# Patient Record
Sex: Female | Born: 1940 | Race: White | Hispanic: No | Marital: Married | Smoking: Former smoker
Health system: Southern US, Community
[De-identification: ages and names within clinical notes are randomized; demographics above are authoritative.]

## PROBLEM LIST (undated history)

## (undated) DIAGNOSIS — I10 Essential (primary) hypertension: Secondary | ICD-10-CM

## (undated) DIAGNOSIS — C259 Malignant neoplasm of pancreas, unspecified: Secondary | ICD-10-CM

## (undated) DIAGNOSIS — Z801 Family history of malignant neoplasm of trachea, bronchus and lung: Secondary | ICD-10-CM

## (undated) DIAGNOSIS — Z8051 Family history of malignant neoplasm of kidney: Secondary | ICD-10-CM

## (undated) DIAGNOSIS — E119 Type 2 diabetes mellitus without complications: Secondary | ICD-10-CM

## (undated) DIAGNOSIS — Z800 Family history of malignant neoplasm of digestive organs: Secondary | ICD-10-CM

## (undated) DIAGNOSIS — N3090 Cystitis, unspecified without hematuria: Secondary | ICD-10-CM

## (undated) HISTORY — DX: Family history of malignant neoplasm of trachea, bronchus and lung: Z80.1

## (undated) HISTORY — DX: Type 2 diabetes mellitus without complications: E11.9

## (undated) HISTORY — DX: Family history of malignant neoplasm of kidney: Z80.51

## (undated) HISTORY — PX: BACK SURGERY: SHX140

## (undated) HISTORY — DX: Family history of malignant neoplasm of digestive organs: Z80.0

## (undated) HISTORY — DX: Essential (primary) hypertension: I10

## (undated) HISTORY — DX: Malignant neoplasm of pancreas, unspecified: C25.9

## (undated) HISTORY — DX: Cystitis, unspecified without hematuria: N30.90

## (undated) HISTORY — PX: TUBAL LIGATION: SHX77

---

## 1998-10-22 ENCOUNTER — Ambulatory Visit (HOSPITAL_COMMUNITY): Admission: RE | Admit: 1998-10-22 | Discharge: 1998-10-22 | Payer: Self-pay | Admitting: *Deleted

## 2000-11-03 ENCOUNTER — Encounter: Payer: Self-pay | Admitting: Specialist

## 2000-11-03 ENCOUNTER — Observation Stay (HOSPITAL_COMMUNITY): Admission: RE | Admit: 2000-11-03 | Discharge: 2000-11-04 | Payer: Self-pay | Admitting: Specialist

## 2001-11-17 ENCOUNTER — Encounter: Payer: Self-pay | Admitting: Specialist

## 2001-11-17 ENCOUNTER — Encounter: Admission: RE | Admit: 2001-11-17 | Discharge: 2001-11-17 | Payer: Self-pay | Admitting: Specialist

## 2002-08-17 ENCOUNTER — Encounter: Admission: RE | Admit: 2002-08-17 | Discharge: 2002-08-17 | Payer: Self-pay | Admitting: Specialist

## 2002-08-17 ENCOUNTER — Encounter: Payer: Self-pay | Admitting: Specialist

## 2005-03-17 ENCOUNTER — Ambulatory Visit: Payer: Self-pay | Admitting: Unknown Physician Specialty

## 2006-03-24 ENCOUNTER — Ambulatory Visit: Payer: Self-pay | Admitting: Unknown Physician Specialty

## 2007-03-30 ENCOUNTER — Ambulatory Visit: Payer: Self-pay | Admitting: Unknown Physician Specialty

## 2007-04-01 ENCOUNTER — Encounter: Admission: RE | Admit: 2007-04-01 | Discharge: 2007-04-01 | Payer: Self-pay | Admitting: Specialist

## 2007-04-01 IMAGING — CR DG MYELOGRAM LUMBAR
6 series · 6 of 6 positions shown · IV contrast (omnipaque)
Comparison: none

CLINICAL DATA: The patient has low back with bilateral buttock and thigh pain.  She is referred for a lumbar myelogram. 
 LUMBAR MYELOGRAM:
 Lumbar puncture was performed at the L3-4 interlaminar level.  CSF is clear.  15cc of Omnipaque 180 contrast was injection and the needle was withdrawn.  Multiple myelogram images were obtained.  Exam supplemented with standing flexion/extension views as well as lateral bending views.  The patient has central stenosis at L4-5 with a left extradural defect.  Mild stenosis L5-S1.  In the lateral projection, ventral defects are seen at L3-4, L4-5, and L5-S1.  Disc height loss at L5-S1.  In the neutral lateral projection, there is no listhesis.  No significant change through flexion and extension.  Lateral bending views show stenosis at L4-5.  Cord filling of the left L4 and L5 roots with bending of the left, poor filling of the right L4 root, with bending of the right.
TECHNIQUE: Multidetector CT imaging of the lumbar spine was performed after intrathecal injection of contrast.  Multiplanar CT image reconstructions were also generated.

[view not recorded (1 of 6)]
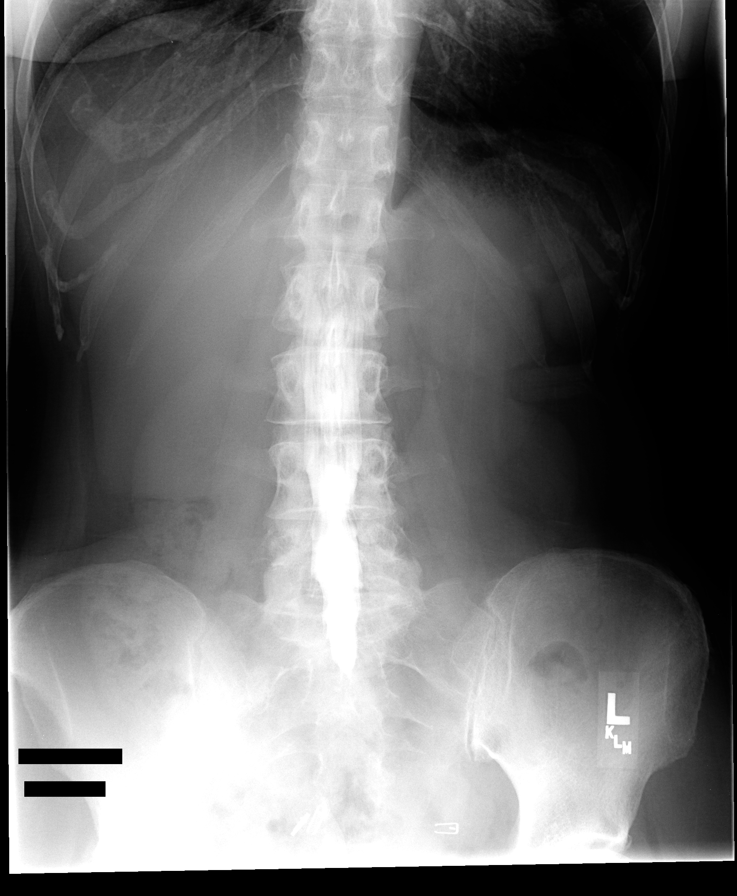

[view not recorded (2 of 6)]
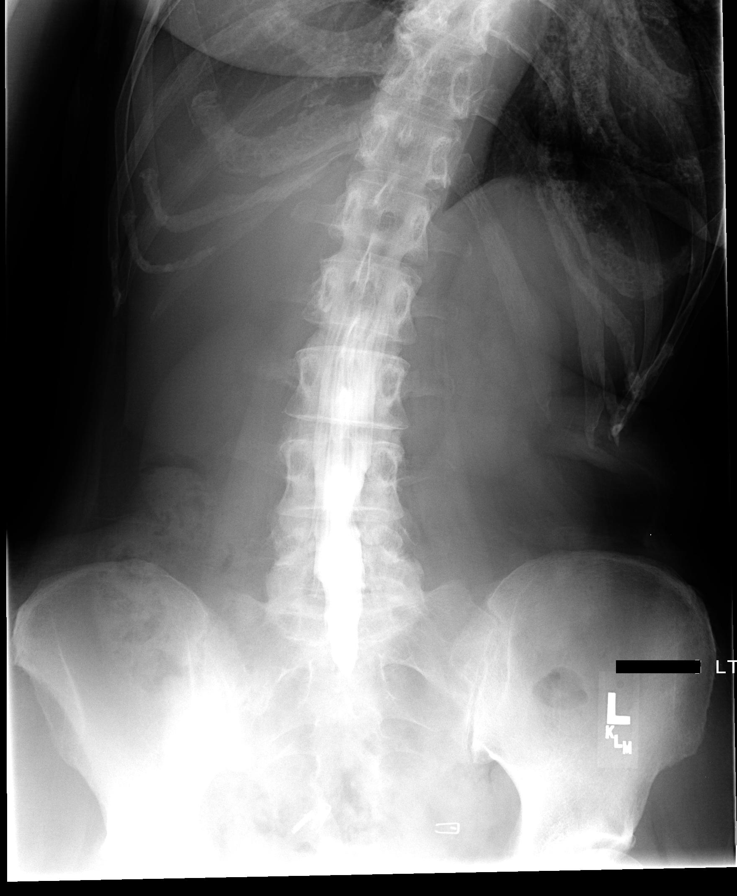

[view not recorded (3 of 6)]
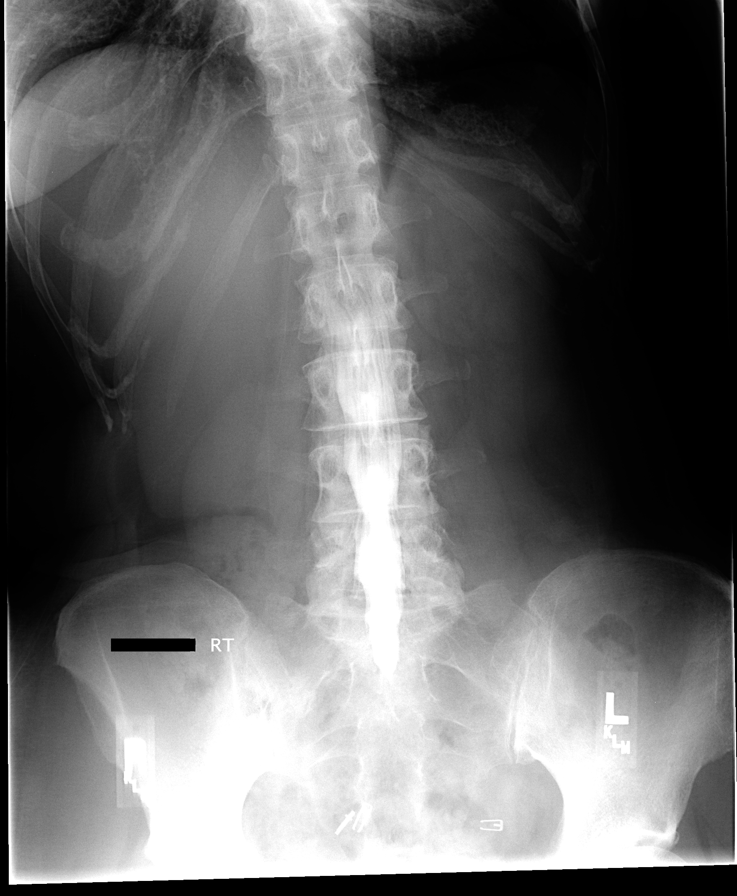

[view not recorded (4 of 6)]
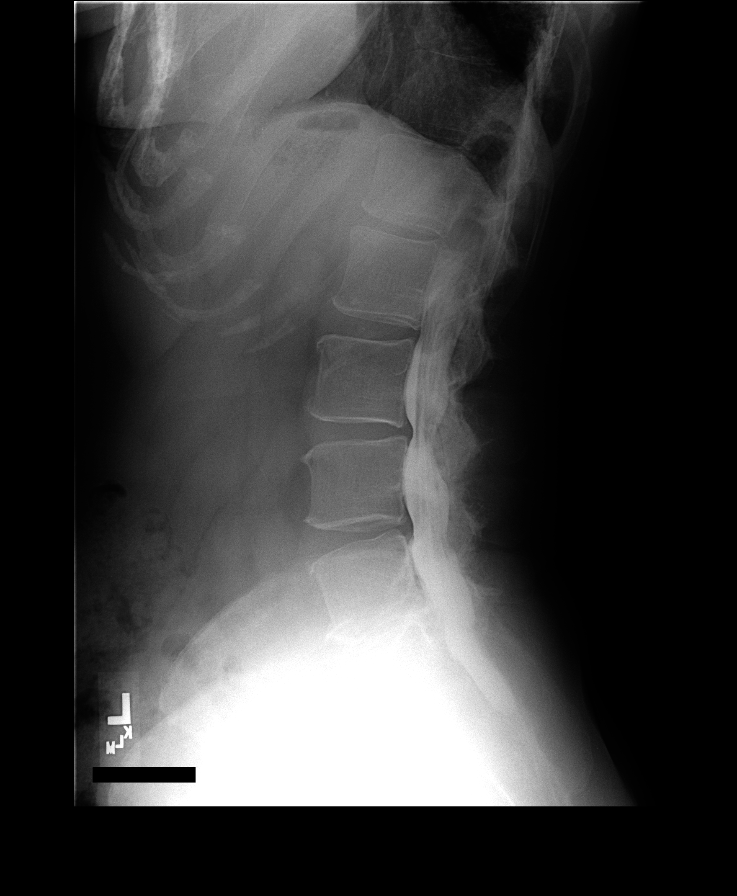

[view not recorded (5 of 6)]
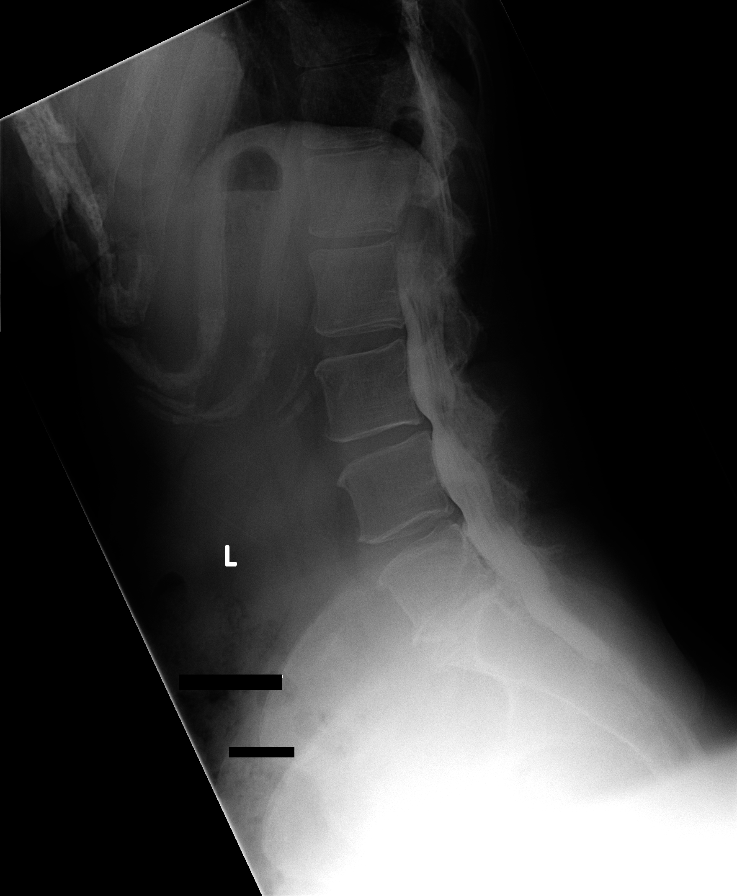

[view not recorded (6 of 6)]
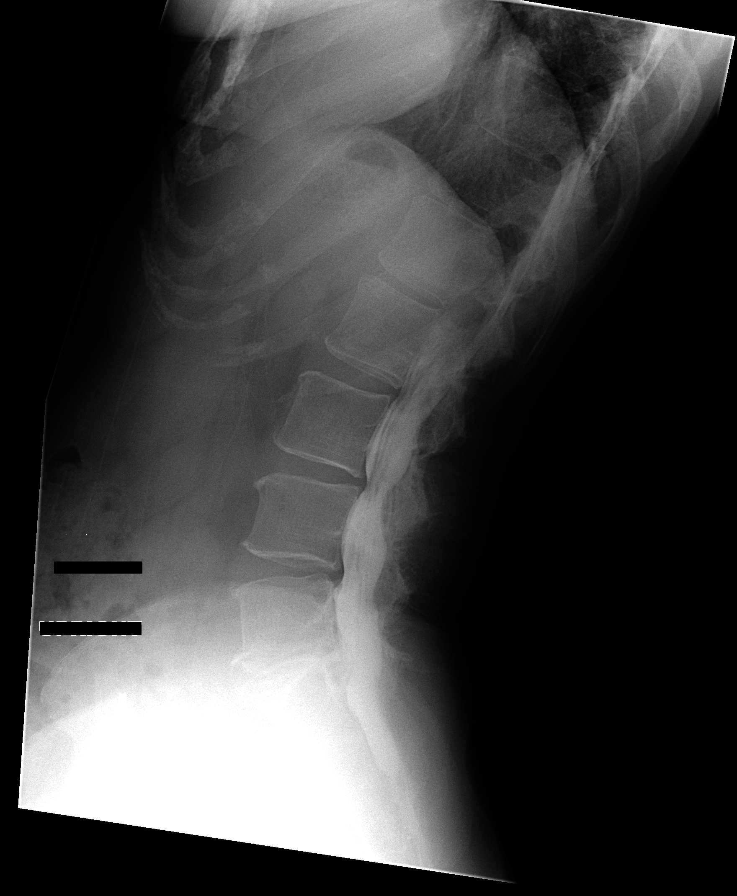

[6 of 6 positions shown; findings below may reference images not displayed]

IMPRESSION: Stenosis at L4-5.  There is poor filling of the left L4 and L5 roots with lateral bending to the left and poor filling of the right L4 root with lateral bending to the right.  No listhesis.  CT to follow. 
 CT LUMBAR SPINE W/CONTRAST (POST-MYELOGRAM):
FINDINGS: In the sagittal neutral position, there is minimal anterolisthesis of L3 on L4 with posterior central disc protrusion.  L4-5 mild disc height loss with disc protrusion.  At L5-S1, there is marked disc height loss with reactive endplate changes.  Subtle vacuum phenomenon is appreciated.  There is extensive posterior osseous spurring.  The parasagittal sequences are negative for encroachment on the exiting nerve roots. 
 Axial Imaging: 
 L1-2:  Normal. 
 L2-3:  Normal. 
 L3-4:  Broad disc protrusion with facet degenerative change right greater than left.   The L3 roots exit without encroachment.  There is moderate right subarticular lateral recess stenosis with mild to moderate multifactorial central stenosis. 
 L4-5:  The patient has a laminectomy defect right.  Diffuse disc bulge eccentric left with caudal extension.  This in conjunction with facet hypertrophy narrow the left subarticular lateral recess with mild to moderate subarticular lateral recess stenosis.  Narrowing of the left L4 foramen without encroachment on the L4 root.  No significant central stenosis. 
 L5-S1:  Disc osteophyte complex in conjunction with disc height loss and facet hypertrophy result in severe left subarticular lateral recess stenosis.  There is central disc protrusion on the thecal sac without stenosis.  No mass effect upon the right S1 root.  The left S1 root is displaced posteriorly.  The right L5 root exits without encroachment.
IMPRESSION: 1.   L3-4:  Moderate multifactorial central stenosis with moderate subarticular recesses stenosis left greater than right. 
 2.  L4-5:  Left subarticular lateral recess stenosis with narrowing in the inferior left L4 foramen.  The central stenosis appreciated on the myelogram is less apparent on the CT. 
 3.  L5-S1:  Severe left subarticular lateral recess stenosis is multifactorial, especially with disc height loss, disc protrusion, and facet hypertrophy.  Additionally, there is moderate to severe left foraminal narrowing.  No definite encroachment on the nerve root is seen on the parasagittal sequences.  There is no right foraminal stenosis nor mass effect on the right S1 root.

## 2007-04-01 IMAGING — CT CT L SPINE W/ CM
3 of 9 series · 10 of 27 positions shown, 11 images · IV contrast (omnipaque)
Comparison: none

CLINICAL DATA: The patient has low back with bilateral buttock and thigh pain.  She is referred for a lumbar myelogram. 
 LUMBAR MYELOGRAM:
 Lumbar puncture was performed at the L3-4 interlaminar level.  CSF is clear.  15cc of Omnipaque 180 contrast was injection and the needle was withdrawn.  Multiple myelogram images were obtained.  Exam supplemented with standing flexion/extension views as well as lateral bending views.  The patient has central stenosis at L4-5 with a left extradural defect.  Mild stenosis L5-S1.  In the lateral projection, ventral defects are seen at L3-4, L4-5, and L5-S1.  Disc height loss at L5-S1.  In the neutral lateral projection, there is no listhesis.  No significant change through flexion and extension.  Lateral bending views show stenosis at L4-5.  Cord filling of the left L4 and L5 roots with bending of the left, poor filling of the right L4 root, with bending of the right.
TECHNIQUE: Multidetector CT imaging of the lumbar spine was performed after intrathecal injection of contrast.  Multiplanar CT image reconstructions were also generated.

[Series 2: l spine · axial · 0.27mm/px · z∈[-214,-132]mm · 2 of 101 slices shown]
[im 34/101  bone]
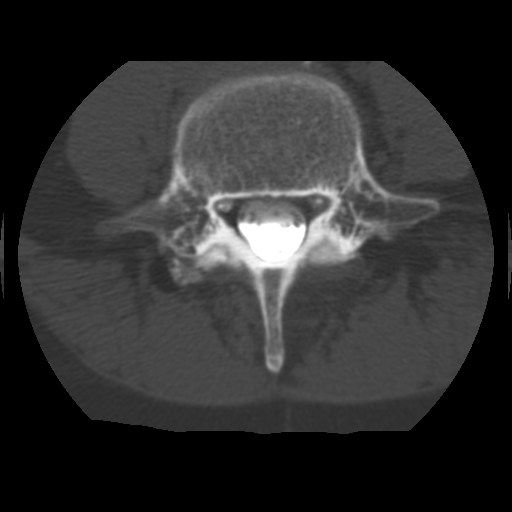
[im 67/101  bone]
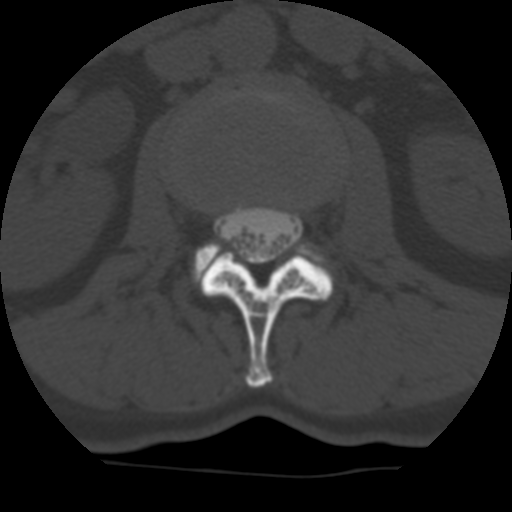

[Series 3: bone windows · axial · 0.27mm/px · z∈[-234,-109]mm · 3 of 101 slices shown, 4 images]
[im 26/101  soft-tissue]
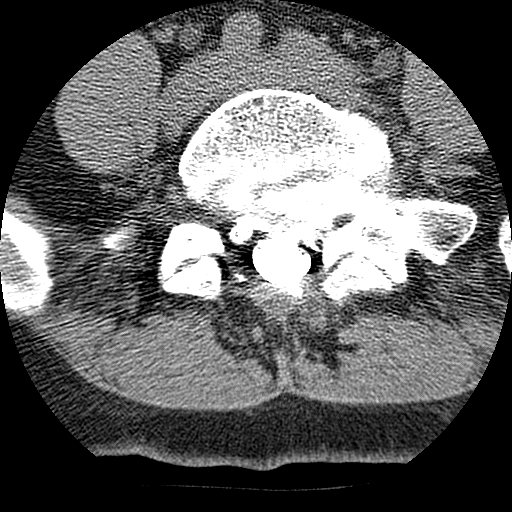
[im 26/101  bone]
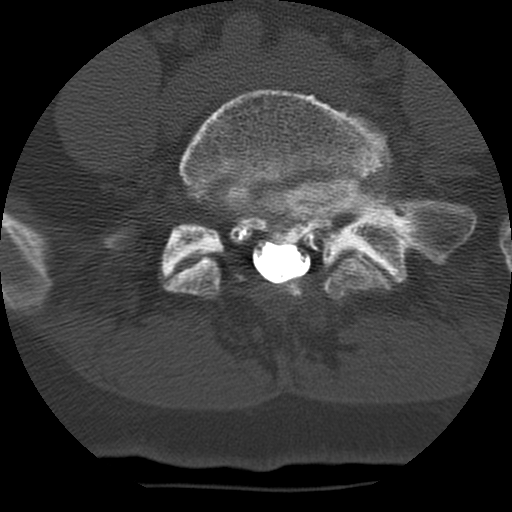
[im 51/101  bone]
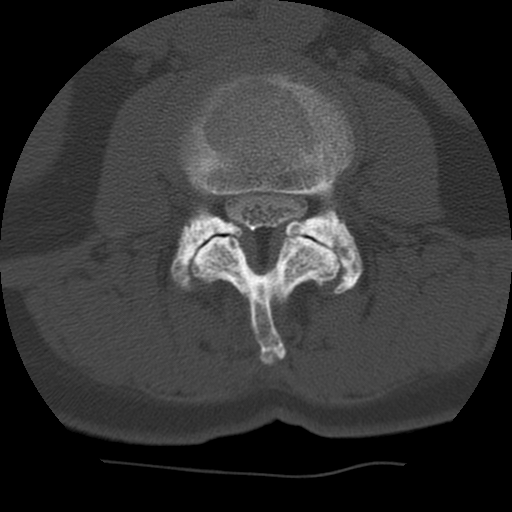
[im 76/101  bone]
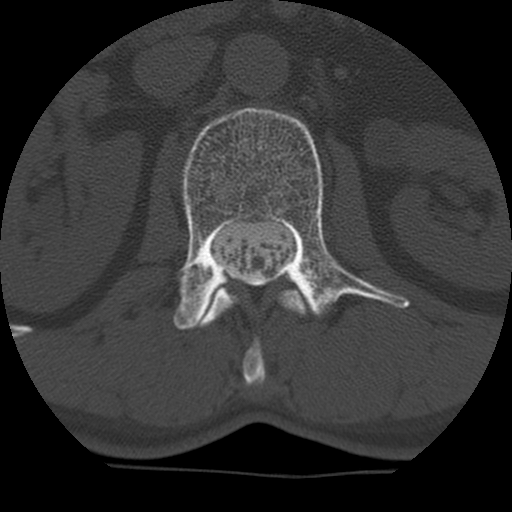

[Series 400: coronal · coronal · 0.50mm/px · 5 of 40 slices shown]
[im 7/40  bone]
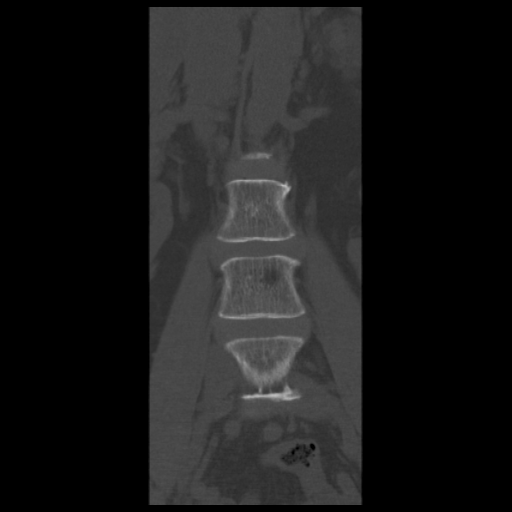
[im 14/40  bone]
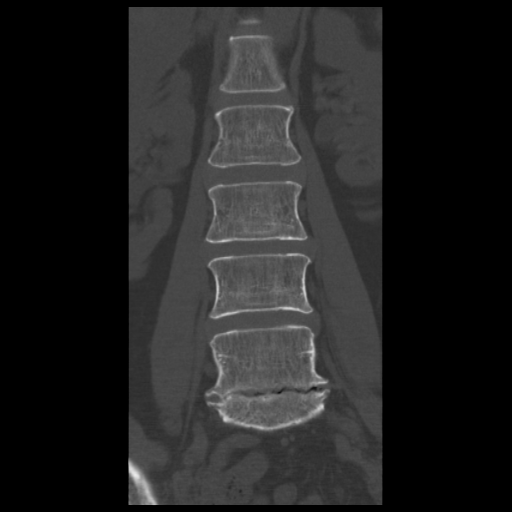
[im 20/40  bone]
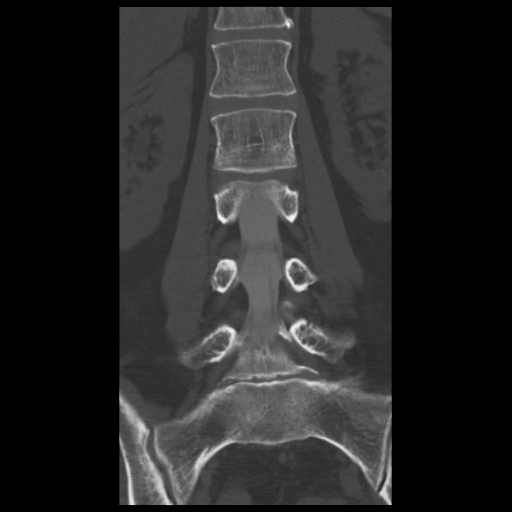
[im 27/40  bone]
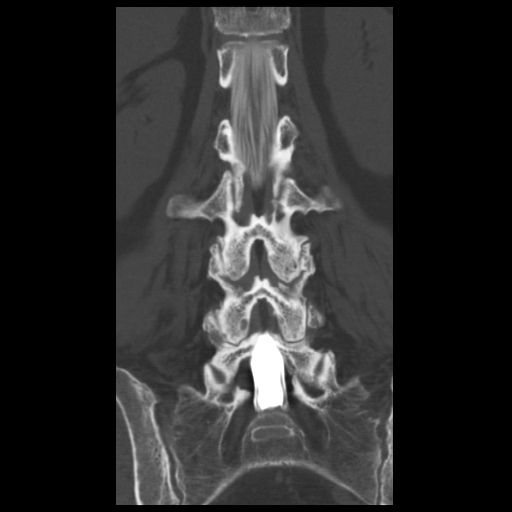
[im 33/40  bone]
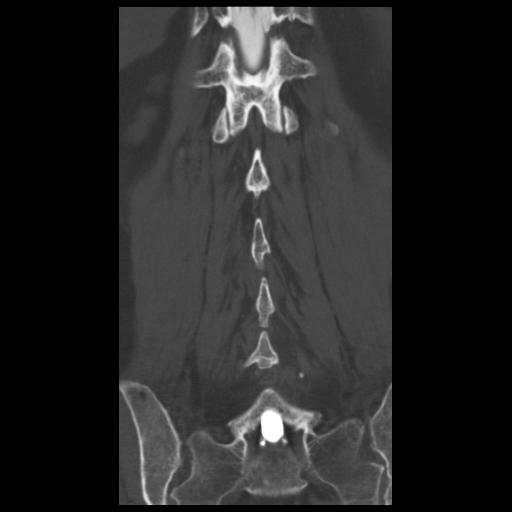

[10 of 27 positions shown; findings below may reference images not displayed]

IMPRESSION: Stenosis at L4-5.  There is poor filling of the left L4 and L5 roots with lateral bending to the left and poor filling of the right L4 root with lateral bending to the right.  No listhesis.  CT to follow. 
 CT LUMBAR SPINE W/CONTRAST (POST-MYELOGRAM):
FINDINGS: In the sagittal neutral position, there is minimal anterolisthesis of L3 on L4 with posterior central disc protrusion.  L4-5 mild disc height loss with disc protrusion.  At L5-S1, there is marked disc height loss with reactive endplate changes.  Subtle vacuum phenomenon is appreciated.  There is extensive posterior osseous spurring.  The parasagittal sequences are negative for encroachment on the exiting nerve roots. 
 Axial Imaging: 
 L1-2:  Normal. 
 L2-3:  Normal. 
 L3-4:  Broad disc protrusion with facet degenerative change right greater than left.   The L3 roots exit without encroachment.  There is moderate right subarticular lateral recess stenosis with mild to moderate multifactorial central stenosis. 
 L4-5:  The patient has a laminectomy defect right.  Diffuse disc bulge eccentric left with caudal extension.  This in conjunction with facet hypertrophy narrow the left subarticular lateral recess with mild to moderate subarticular lateral recess stenosis.  Narrowing of the left L4 foramen without encroachment on the L4 root.  No significant central stenosis. 
 L5-S1:  Disc osteophyte complex in conjunction with disc height loss and facet hypertrophy result in severe left subarticular lateral recess stenosis.  There is central disc protrusion on the thecal sac without stenosis.  No mass effect upon the right S1 root.  The left S1 root is displaced posteriorly.  The right L5 root exits without encroachment.
IMPRESSION: 1.   L3-4:  Moderate multifactorial central stenosis with moderate subarticular recesses stenosis left greater than right. 
 2.  L4-5:  Left subarticular lateral recess stenosis with narrowing in the inferior left L4 foramen.  The central stenosis appreciated on the myelogram is less apparent on the CT. 
 3.  L5-S1:  Severe left subarticular lateral recess stenosis is multifactorial, especially with disc height loss, disc protrusion, and facet hypertrophy.  Additionally, there is moderate to severe left foraminal narrowing.  No definite encroachment on the nerve root is seen on the parasagittal sequences.  There is no right foraminal stenosis nor mass effect on the right S1 root.

## 2007-04-01 IMAGING — RF DG MYELOGRAM LUMBAR
13 of 18 series · 13 of 18 positions shown · IV contrast (omnipaque)
Comparison: none

CLINICAL DATA: The patient has low back with bilateral buttock and thigh pain.  She is referred for a lumbar myelogram. 
 LUMBAR MYELOGRAM:
 Lumbar puncture was performed at the L3-4 interlaminar level.  CSF is clear.  15cc of Omnipaque 180 contrast was injection and the needle was withdrawn.  Multiple myelogram images were obtained.  Exam supplemented with standing flexion/extension views as well as lateral bending views.  The patient has central stenosis at L4-5 with a left extradural defect.  Mild stenosis L5-S1.  In the lateral projection, ventral defects are seen at L3-4, L4-5, and L5-S1.  Disc height loss at L5-S1.  In the neutral lateral projection, there is no listhesis.  No significant change through flexion and extension.  Lateral bending views show stenosis at L4-5.  Cord filling of the left L4 and L5 roots with bending of the left, poor filling of the right L4 root, with bending of the right.
TECHNIQUE: Multidetector CT imaging of the lumbar spine was performed after intrathecal injection of contrast.  Multiplanar CT image reconstructions were also generated.

[Series 1: myelogram  white · 1 of 1 slices shown (1 of 13)]
[im 1/1]
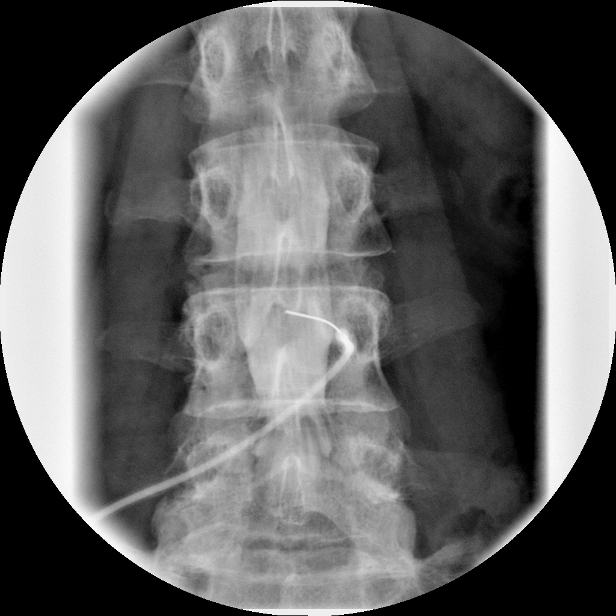

[Series 3: myelogram  white · 1 of 1 slices shown (2 of 13)]
[im 1/1]
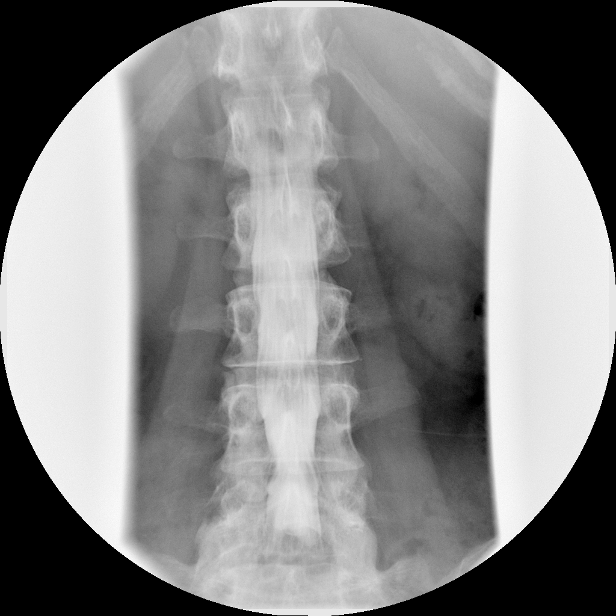

[Series 4: myelogram  white · 1 of 1 slices shown (3 of 13)]
[im 1/1]
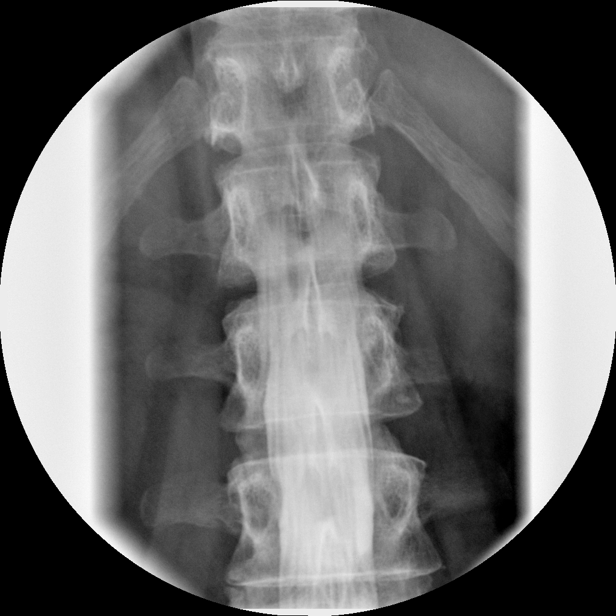

[Series 5: myelogram  white · 1 of 1 slices shown (4 of 13)]
[im 1/1]
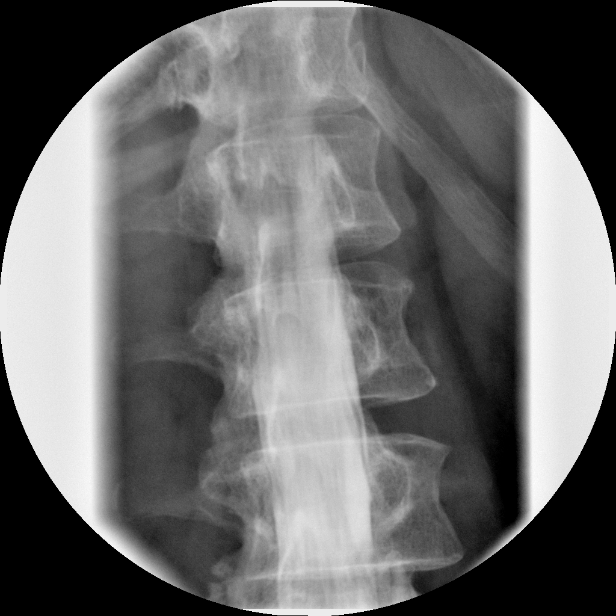

[Series 7: myelogram  white · 1 of 1 slices shown (5 of 13)]
[im 1/1]
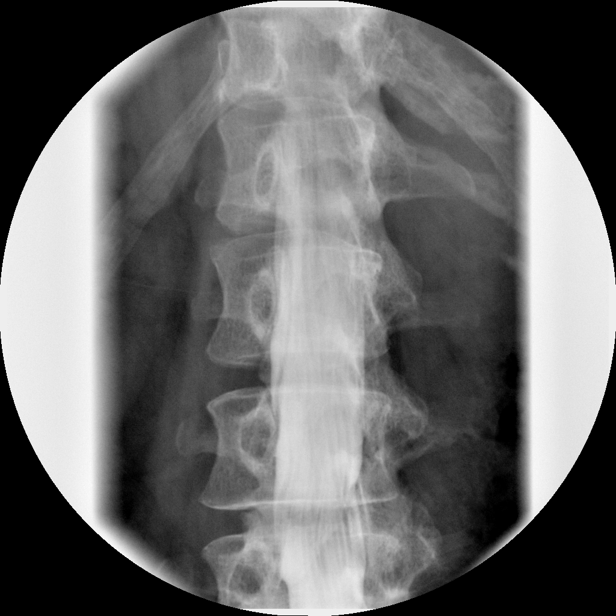

[Series 8: myelogram  white · 1 of 1 slices shown (6 of 13)]
[im 1/1]
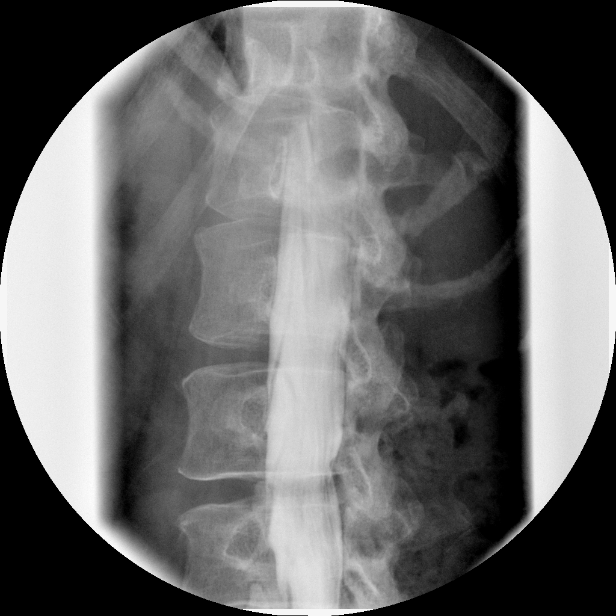

[Series 10: myelogram  white · 1 of 1 slices shown (7 of 13)]
[im 1/1]
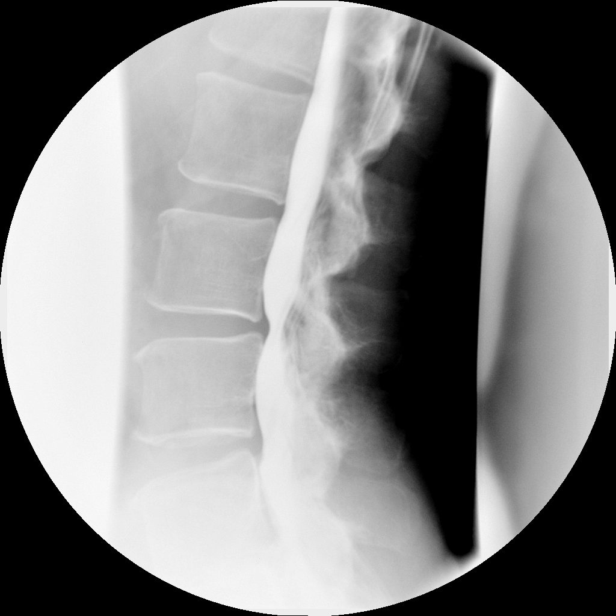

[Series 11: myelogram  white · 1 of 1 slices shown (8 of 13)]
[im 1/1]
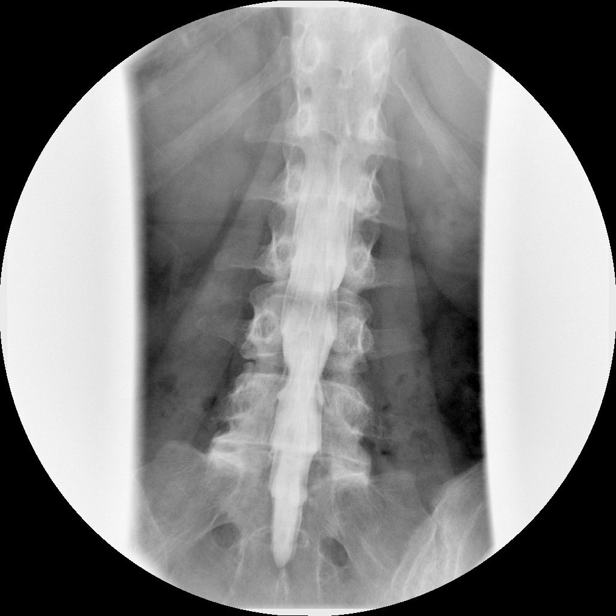

[Series 12: myelogram  white · 1 of 1 slices shown (9 of 13)]
[im 1/1]
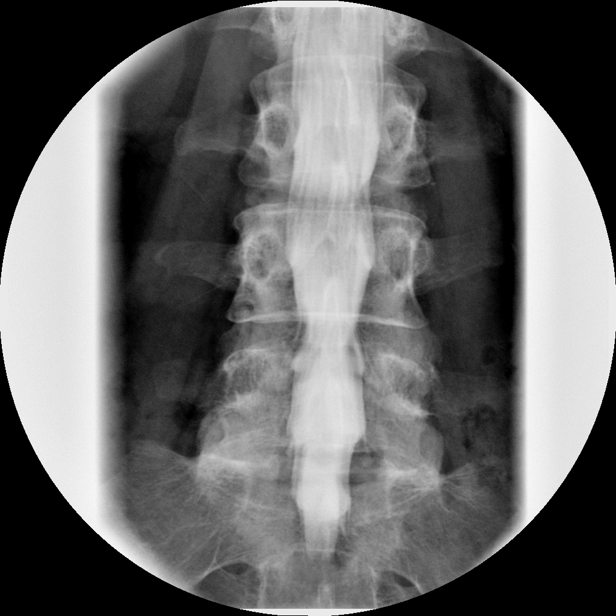

[Series 14: myelogram  white · 1 of 1 slices shown (10 of 13)]
[im 1/1]
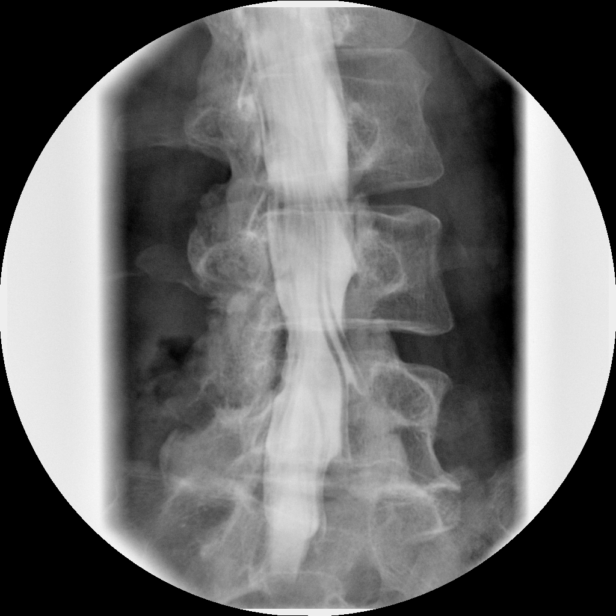

[Series 15: myelogram  white · 1 of 1 slices shown (11 of 13)]
[im 1/1]
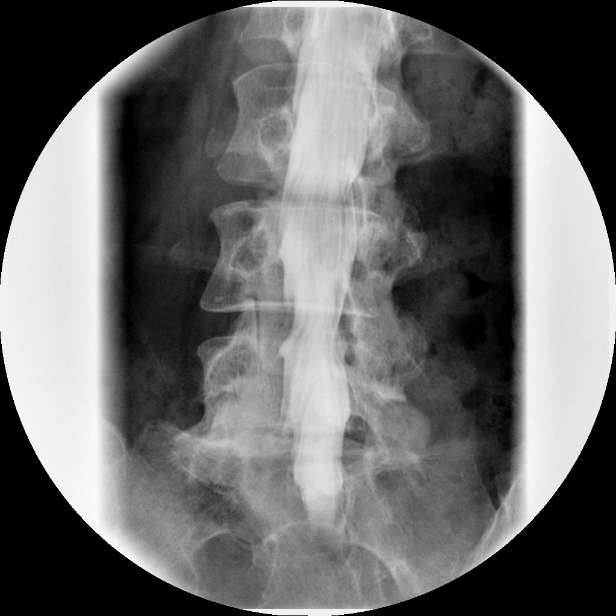

[Series 16: myelogram  white · 1 of 1 slices shown (12 of 13)]
[im 1/1]
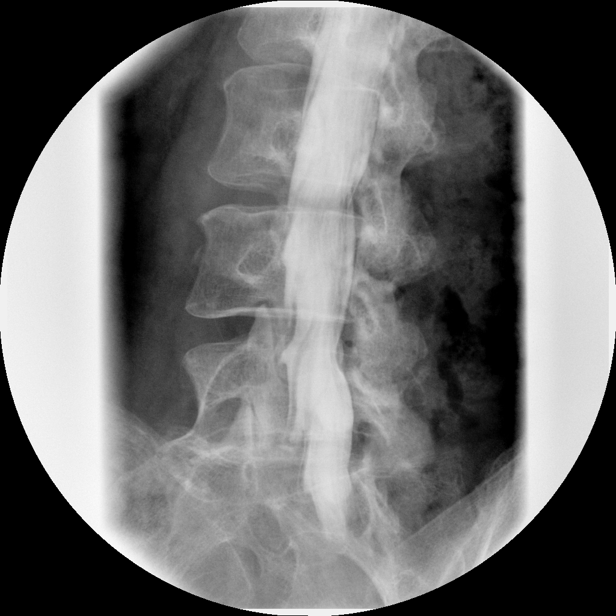

[Series 18: myelogram  white · 1 of 1 slices shown (13 of 13)]
[im 1/1]
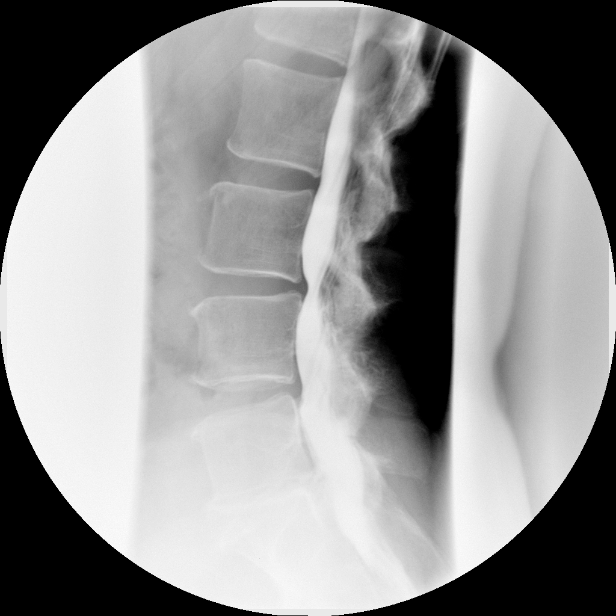

[13 of 18 positions shown; findings below may reference images not displayed]

IMPRESSION: Stenosis at L4-5.  There is poor filling of the left L4 and L5 roots with lateral bending to the left and poor filling of the right L4 root with lateral bending to the right.  No listhesis.  CT to follow. 
 CT LUMBAR SPINE W/CONTRAST (POST-MYELOGRAM):
FINDINGS: In the sagittal neutral position, there is minimal anterolisthesis of L3 on L4 with posterior central disc protrusion.  L4-5 mild disc height loss with disc protrusion.  At L5-S1, there is marked disc height loss with reactive endplate changes.  Subtle vacuum phenomenon is appreciated.  There is extensive posterior osseous spurring.  The parasagittal sequences are negative for encroachment on the exiting nerve roots. 
 Axial Imaging: 
 L1-2:  Normal. 
 L2-3:  Normal. 
 L3-4:  Broad disc protrusion with facet degenerative change right greater than left.   The L3 roots exit without encroachment.  There is moderate right subarticular lateral recess stenosis with mild to moderate multifactorial central stenosis. 
 L4-5:  The patient has a laminectomy defect right.  Diffuse disc bulge eccentric left with caudal extension.  This in conjunction with facet hypertrophy narrow the left subarticular lateral recess with mild to moderate subarticular lateral recess stenosis.  Narrowing of the left L4 foramen without encroachment on the L4 root.  No significant central stenosis. 
 L5-S1:  Disc osteophyte complex in conjunction with disc height loss and facet hypertrophy result in severe left subarticular lateral recess stenosis.  There is central disc protrusion on the thecal sac without stenosis.  No mass effect upon the right S1 root.  The left S1 root is displaced posteriorly.  The right L5 root exits without encroachment.
IMPRESSION: 1.   L3-4:  Moderate multifactorial central stenosis with moderate subarticular recesses stenosis left greater than right. 
 2.  L4-5:  Left subarticular lateral recess stenosis with narrowing in the inferior left L4 foramen.  The central stenosis appreciated on the myelogram is less apparent on the CT. 
 3.  L5-S1:  Severe left subarticular lateral recess stenosis is multifactorial, especially with disc height loss, disc protrusion, and facet hypertrophy.  Additionally, there is moderate to severe left foraminal narrowing.  No definite encroachment on the nerve root is seen on the parasagittal sequences.  There is no right foraminal stenosis nor mass effect on the right S1 root.

## 2008-03-30 ENCOUNTER — Ambulatory Visit: Payer: Self-pay | Admitting: Unknown Physician Specialty

## 2009-02-27 ENCOUNTER — Ambulatory Visit: Payer: Self-pay | Admitting: Unknown Physician Specialty

## 2009-02-27 LAB — HM DEXA SCAN: HM DEXA SCAN: NORMAL

## 2009-04-02 ENCOUNTER — Ambulatory Visit: Payer: Self-pay | Admitting: Unknown Physician Specialty

## 2010-04-07 ENCOUNTER — Ambulatory Visit: Payer: Self-pay | Admitting: Unknown Physician Specialty

## 2010-11-09 ENCOUNTER — Encounter: Payer: Self-pay | Admitting: Specialist

## 2011-03-06 NOTE — Op Note (Signed)
[facility]  Patient:    BETHLEHEM, LANGSTAFF                         MRN: [id number] Proc. Date: 11/03/00 Attending:  Javier Docker, M.D.                           Operative Report  PREOPERATIVE DIAGNOSES: 1. Herniated nucleus pulposus L5-S1. 2. 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  POSTOPERATIVE DIAGNOSES: 1. 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 2. Calcified disk L5-S1.  OPERATION: 1. Hemilaminotomy at L5-S1 with excision of osteophytic spur of the disk    space. 2. Hemilaminotomy lateral recess decompression L4-5. 3. Foraminotomy at L5.  SURGEON:  Javier Docker, M.D.  ASSISTANT:  Kerrin Champagne, M.D.  ANESTHESIA:  General.  BRIEF HISTORY AND INDICATIONS:  A 70 year old with L5 radicular pain left lower extremity secondary to the above-mentioned pathology.  The patient failed conservative treatment.  Operative intervention is indicated for decompression of L4-5 and L5-S1 and diskectomy.  Risks and benefits of the procedure were discussed including bleeding, infection, damage to neurovascular structures, no change in symptoms, need to repeat decompression, need for fusion in the future, etc.  He has end-stage disk degeneration, and he was instructed this would not help the back pain.  TECHNIQUE:  The patient was placed in the supine position after induction of adequate general anesthesia with 1 g Kefzol for IV prophylaxis.  The patient was placed prone on the [place] frame.  All bony prominences were well padded. The lumbar region was prepped and draped in the usual sterile fashion.  Two 22-gauge spinal needles were utilized to localize the L4-5 and L5-S1 interspace and confirmed with x-ray.  Incision was then made from the spinous process of L4 to spinous process S1.  Subcutaneous tissue was dissected. Electrocautery was utilized to achieve hemostasis.  Marcaine 0.25% with epinephrine was infiltrated in subcutaneous tissue.  The dorsal  lumbar fascia was identified and divided in line with the skin incision.  Paraspinous muscles were elevated from the lamina of L4-5 and S1.  McCullough retractor was placed.  A confirmatory radiograph was obtained with Penfield in intralaminal space at L4-5 and L5-S1.  Operating microscope was draped and brought into the surgical field.  High-speed bur was utilized to perform a hemilaminotomy at L5 and at L4 at the inferolateral aspect.  Neural elements were well protected, and ligamentum flavum was first removed from the interspace at 4-5.  Superolateral recess stenosis was noted secondary to facet hypertrophy.  Partial medial hemifacetectomy was performed as well as an L5 foraminotomy.  The L5 nerve root was found to be severely compressed into the lateral recess.  Following decompression, it was fully mobile without residual compression.  There was no CSF leakage or active bleeding.  The disk was examined and found to be nonherniated.   Bipolar electrocautery was utilized to achieve hemostasis throughout.  High-speed bur was placed at foramen 4-5 and found to be widely patent.  Attention was turned to the L5-S1 disk space.  In a similar fashion, lateral recess was decompressed with severe lateral recess stenosis noted. Foraminotomy of S1 was performed and of L5.  L5 and S1 foramen were widely patent.  The nerve root was found to be erythematous and edematous.  It was well decompressed.  Examination of the disk space revealed a osteophytic spur consistent with that seen on the MRI as a disk  herniation.  It was felt to be a calcified disk.  The disk space was unable to be penetrated due to the end-stage disk degeneration.  An osteophyte rongeur was utilized to remove this.  The S1 nerve root was found to be well decompressed.  There was a small dorsal ventral ridge remaining that was not compressed.  The wound was copiously irrigated.  Inspection revealed no evidence of active bleeding  or CSF leakage.  Thrombin-soaked Gelfoam was placed in the laminotomy defect. The operating microscope was removed.  The McCullough retractor was removed. Paraspinous muscles were inspected with no evidence of active bleeding. Dorsal lumbar fascia was reapproximated with #1 Vicryl figure-of-eight sutures.  Subcutaneous tissue was reapproximated with 2-0 Vicryl sutures. Skin was reapproximated with a 4-0 subcuticular PDS and reinforced with Steri-Strips.  The patient was placed supine on the hospital bed, extubated without difficulty, and transported to the recovery room in satisfactory condition.  The patient tolerated the procedure well without complication. DD:  11/03/00 TD:  11/04/00 Job: [id number]

## 2011-03-06 NOTE — H&P (Signed)
[facility]  Patient:    Rhonda Day, Rhonda Day                         MRN: [id number] Attending:  Javier Docker, M.D. Dictator:   Ralene Bathe, P.A.                         History and Physical  DATE OF BIRTH:  [birth year]  CHIEF COMPLAINT:  Back and left hip and leg pain.  HISTORY OF PRESENT ILLNESS:  This is a 70 year old female who has had about a 3-4 month history of progressive left hip pain.  She reports no injury. Basically she had low back pain; however, most of her complaints were buttock and posterior thigh pain.  She was treated conservatively and underwent an MRI, which showed disk herniation at L5-S1 with lateral recess _________ L4-5.  She underwent epidural steroid injections which were temporarily effective.  At this point her pain is worse with activity.  It is significantly limiting her activity levels.  At this with her MRI it is felt that decompression is indicated with her pain syndromes.  Risks and benefits were discussed with the patient at length and she wished to proceed.  PAST MEDICAL HISTORY: 1. History of migraines followed by Dr. Meryl Crutch. 2. Allergic rhinitis.  PAST SURGICAL HISTORY:  BTL.  DRUG ALLERGIES:  SULFA causes a rash, VICODIN causes itching.  MEDICATIONS: 1. Prempro 0.625 mg q.d. 2. Allegra q.d. 3. Maxalt p.r.n. headaches.  SOCIAL HISTORY:  Patient does not smoke, uses rare alcohol.  She lives with significant other, has one story home, one step into the usual entrance.  FAMILY HISTORY:  Noncontributory.  REVIEW OF SYSTEMS:  Patient denies any recent fevers, chills, night sweats, no bleeding tendencies.  CNS:  No blurred or double vision, seizures, paralysis. RESPIRATORY:  No shortness of breath, cough, hemoptysis.  CARDIOVASCULAR:  No chest pain, angina, or orthopnea.  GASTROINTESTINAL:  Denies nausea, vomiting, constipation, melena, bloody stools.  GENITOURINARY:  No dysuria,  hematuria. MUSCULOSKELETAL:  As pertinent to present illness.  PHYSICAL EXAMINATION:  GENERAL:  A well-developed, well-nourished 70 year old female who appears younger than her stated age.  VITAL SIGNS:  Blood pressure 132/80, respirations 14, and pulse is 80.  HEENT:  Normocephalic, atraumatic.  Extraocular motion is intact.  NECK:  Supple, no lymphadenopathy, no carotid bruits per initial exam.  CHEST:  Clear to auscultation bilaterally, no rales or rhonchi.  HEART:  Regular rate and rhythm, no murmurs, gallops, rubs, heaves, or thrills.  ABDOMEN:  Positive bowel sounds, soft, nontender.  EXTREMITIES:  No edema, positive distal pulses.  She has minimal tenderness in the PSI on the left.  She has a straight leg raise with marked buttock and posterior thigh pain on the left.  NEUROLOGIC:  Motor exam is intact bilaterally.  No Babinski, no clonus.  IMPRESSION: 1. Herniated nucleus pulposus, L5-S1 with lateral recess stenosis, L4-5 and    L5-S1 on the left. 2. History of migraines. 3. Allergic rhinitis.  PLAN:  Patient will be admitted for microdiskectomy, L5-S1 for lateral recess decompression at L4-5 and L5-S1 on the left. DD:  10/27/00 TD:  10/27/00 Job: [id number]

## 2011-04-17 ENCOUNTER — Ambulatory Visit: Payer: Self-pay | Admitting: Unknown Physician Specialty

## 2012-11-11 DIAGNOSIS — M545 Low back pain, unspecified: Secondary | ICD-10-CM | POA: Insufficient documentation

## 2012-11-11 DIAGNOSIS — N9489 Other specified conditions associated with female genital organs and menstrual cycle: Secondary | ICD-10-CM | POA: Insufficient documentation

## 2012-11-11 DIAGNOSIS — N393 Stress incontinence (female) (male): Secondary | ICD-10-CM

## 2012-11-11 DIAGNOSIS — R399 Unspecified symptoms and signs involving the genitourinary system: Secondary | ICD-10-CM

## 2012-11-11 DIAGNOSIS — N318 Other neuromuscular dysfunction of bladder: Secondary | ICD-10-CM | POA: Insufficient documentation

## 2012-11-11 DIAGNOSIS — N949 Unspecified condition associated with female genital organs and menstrual cycle: Secondary | ICD-10-CM | POA: Insufficient documentation

## 2012-11-11 HISTORY — DX: Stress incontinence (female) (male): N39.3

## 2012-11-11 HISTORY — DX: Low back pain, unspecified: M54.50

## 2012-11-11 HISTORY — DX: Other neuromuscular dysfunction of bladder: N31.8

## 2012-11-11 HISTORY — DX: Unspecified symptoms and signs involving the genitourinary system: R39.9

## 2012-11-11 HISTORY — DX: Other specified conditions associated with female genital organs and menstrual cycle: N94.89

## 2012-11-16 DIAGNOSIS — R339 Retention of urine, unspecified: Secondary | ICD-10-CM

## 2012-11-16 HISTORY — DX: Retention of urine, unspecified: R33.9

## 2014-09-14 DIAGNOSIS — N390 Urinary tract infection, site not specified: Secondary | ICD-10-CM | POA: Insufficient documentation

## 2014-09-14 HISTORY — DX: Urinary tract infection, site not specified: N39.0

## 2015-03-19 LAB — HM PAP SMEAR

## 2015-05-08 LAB — HM MAMMOGRAPHY

## 2015-11-15 ENCOUNTER — Encounter: Payer: Self-pay | Admitting: Sports Medicine

## 2015-11-15 ENCOUNTER — Ambulatory Visit (INDEPENDENT_AMBULATORY_CARE_PROVIDER_SITE_OTHER): Payer: 59 | Admitting: Sports Medicine

## 2015-11-15 DIAGNOSIS — I10 Essential (primary) hypertension: Secondary | ICD-10-CM

## 2015-11-15 DIAGNOSIS — E119 Type 2 diabetes mellitus without complications: Secondary | ICD-10-CM | POA: Diagnosis not present

## 2015-11-15 DIAGNOSIS — M79674 Pain in right toe(s): Secondary | ICD-10-CM | POA: Diagnosis not present

## 2015-11-15 DIAGNOSIS — L600 Ingrowing nail: Secondary | ICD-10-CM

## 2015-11-15 HISTORY — DX: Type 2 diabetes mellitus without complications: E11.9

## 2015-11-15 HISTORY — DX: Essential (primary) hypertension: I10

## 2015-11-15 NOTE — Patient Instructions (Signed)

## 2015-11-15 NOTE — Progress Notes (Deleted)
   Subjective:    Patient ID: Rhonda Day, female    DOB: 1941-04-17, 75 y.o.   MRN: [id number]  HPI    Review of Systems  Musculoskeletal:       Disc degenerative   All other systems reviewed and are negative.      Objective:   Physical Exam        Assessment & Plan:

## 2015-11-15 NOTE — Progress Notes (Signed)
Patient ID: Rhonda Day, female   DOB: Mar 11, 1941, 75 y.o.   MRN: [id number] Subjective: Rhonda Day is a 75 y.o.  female patient presents to office today complaining of a painful incurvated, lateral nail border of the 1st toe on the Right foot. This has been present for several months. Patient has treated this by getting pedicures which may have made it worse. Patient denies fever/chills/nausea/vomitting/any other related constitutional symptoms at this time.  FBS 135 this am  Patient Active Problem List   Diagnosis Date Noted  . Type 2 diabetes mellitus ([place]) 11/15/2015  . Essential (primary) hypertension 11/15/2015  . Acute urinary tract infection 09/14/2014  . Incomplete bladder emptying 11/16/2012  . 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 LBP (low back pain) 11/11/2012  . Bladder compliance low 11/11/2012  . Female genital symptoms 11/11/2012  . Symptoms involving urinary system 11/11/2012   No current outpatient prescriptions on file prior to visit.   No current facility-administered medications on file prior to visit.   Allergies  Allergen Reactions  . Sulfa Antibiotics Rash    Other reaction(s): RASH     Objective:  General: Well developed, nourished, in no acute distress, alert and oriented x3   Dermatology: Skin is warm, dry and supple bilateral. Right hallux nail appears to be  severely incurvated with hyperkeratosis formation at the distal aspects of  The lateral nail border. (+) Erythema. (+) Edema. (-) serosanguous  drainage present. The remaining nails appear unremarkable at this time. There are no open sores, lesions or other signs of infection  present.  Vascular: Dorsalis Pedis artery and Posterior Tibial artery pedal pulses are 2/4 bilateral with immedate capillary fill time. Pedal hair growth present. No lower extremity edema.   Neruologic: Grossly intact via light touch bilateral. Protective intact with SWMF bilateral. Vibratory intact  bilateral.  Musculoskeletal: Tenderness to palpation of the Right hallux lateral nail fold. Muscular strength within normal limits in all groups bilateral.   Assesement and Plan: Problem List Items Addressed This Visit    None    Visit Diagnoses    Pain in toe of right foot    -  Primary    Ingrown nail        Right hallux lateral margin    Diabetes mellitus without complication (HCC)        Relevant Medications    aspirin EC 81 MG tablet    metFORMIN (GLUCOPHAGE-XR) 500 MG 24 hr tablet    metFORMIN (GLUCOPHAGE-XR) 500 MG 24 hr tablet      -Discussed treatment alternatives and plan of care; Explained permanent/temporary nail avulsion and post procedure course to patient. Patient opt for PNA. - After a verbal consent, injected 3 ml of a 50:50 mixture of 2% plain  lidocaine and 0.5% plain marcaine in a normal hallux block fashion. Next, a  betadine prep was performed. Anesthesia was tested and found to be appropriate.  The offending right hallux lateral nail border was then incised from the hyponychium to the epinychium. The offending nail border was removed and cleared from the field. The area was curretted for any remaining nail or spicules. Phenol application performed and the area was then flushed with alcohol and dressed with antibiotic cream and a dry sterile dressing. -Patient was instructed to leave the dressing intact for today and begin soaking  in a weak solution of betadine and water tomorrow. Patient was instructed to  soak for 15 minutes each day and apply neosporin and a gauze  or bandaid dressing each day. -Patient was instructed to monitor the toe for signs of infection and return to office if toe becomes red, hot or swollen. -Patient is to return in 1 week for follow up care or sooner if problems arise.  Landis Martins, DPM

## 2015-11-26 ENCOUNTER — Encounter: Payer: Self-pay | Admitting: Sports Medicine

## 2015-11-26 ENCOUNTER — Ambulatory Visit (INDEPENDENT_AMBULATORY_CARE_PROVIDER_SITE_OTHER): Payer: 59 | Admitting: Sports Medicine

## 2015-11-26 DIAGNOSIS — M79674 Pain in right toe(s): Secondary | ICD-10-CM

## 2015-11-26 DIAGNOSIS — L600 Ingrowing nail: Secondary | ICD-10-CM

## 2015-11-26 DIAGNOSIS — E119 Type 2 diabetes mellitus without complications: Secondary | ICD-10-CM

## 2015-11-26 NOTE — Progress Notes (Signed)
Patient ID: Rhonda Day, female   DOB: 07-14-1941, 75 y.o.   MRN: [id number]  Subjective: Rhonda Day is a 75 y.o.  Diabetic female patient returns to office today for follow up evaluation after having Right Hallux lateral permanent nail avulsion performed on 11-15-15. Patient has been soaking using betadine and applying topical antibiotic covered with bandaid daily. Patient deniesfever/chills/nausea/vomitting/any other related constitutional symptoms at this time.  FBS 124 this am  Patient Active Problem List   Diagnosis Date Noted  . Type 2 diabetes mellitus ([place]) 11/15/2015  . Essential (primary) hypertension 11/15/2015  . Acute urinary tract infection 09/14/2014  . Incomplete bladder emptying 11/16/2012  . 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 LBP (low back pain) 11/11/2012  . Bladder compliance low 11/11/2012  . Female genital symptoms 11/11/2012  . Symptoms involving urinary system 11/11/2012   Current Outpatient Prescriptions on File Prior to Visit  Medication Sig Dispense Refill  . aspirin EC 81 MG tablet Take by mouth.    . estradiol (ESTRACE) 0.5 MG tablet   1  . estradiol (ESTRACE) 0.5 MG tablet Take by mouth.    . estrogen, conjugated,-medroxyprogesterone (PREMPRO) 0.3-1.5 MG tablet Take by mouth.    . hydrochlorothiazide (HYDRODIURIL) 25 MG tablet   0  . medroxyPROGESTERone (PROVERA) 2.5 MG tablet   1  . medroxyPROGESTERone (PROVERA) 2.5 MG tablet Take by mouth.    . metFORMIN (GLUCOPHAGE-XR) 500 MG 24 hr tablet   0  . metFORMIN (GLUCOPHAGE-XR) 500 MG 24 hr tablet Take by mouth.    . ONE TOUCH ULTRA TEST test strip USE AS DIRECTED. CHECK CBG'S DAILY AND AS NEEDED IF SYMPTOMATIC  1  . ONETOUCH DELICA LANCETS 99991111 MISC TAKE AS DIRECTED ONCE DAILY AND IF NEEDED  0  . pneumococcal 13-valent conjugate vaccine (PREVNAR 13) SUSP injection     . ULTRACET 37.5-325 MG tablet      No current facility-administered medications on file prior to visit.   Allergies   Allergen Reactions  . Sulfa Antibiotics Rash    Other reaction(s): RASH    Objective:  General: Well developed, nourished, in no acute distress, alert and oriented x3   Dermatology: Skin is warm, dry and supple bilateral. RIght hallux lateral nail bed appears to be clean, dry, with mild granular tissue and surrounding eschar/scab. (-) Erythema. (-) Edema. (-) serosanguous drainage present. The remaining nails appear unremarkable at this time. There are no other lesions or other signs of infection present.  Neurovascular status: Intact. No lower extremity swelling; No pain with calf compression bilateral.  Musculoskeletal: Decreased tenderness to palpation of the Right hallux lateral nail fold. Muscular strength within normal limits bilateral.   Assesement and Plan: Problem List Items Addressed This Visit    None    Visit Diagnoses    Ingrown nail    -  Primary    Pain in toe of right foot        S/p PNA right hallux lateral nail margin, 11-15-15, healing well without complication    Diabetes mellitus without complication ([place])          -Examined patient  -Cleansed right hallux lateral nail fold and gently scrubbed with peroxide and curetted away eschar at site and applied antibiotic cream covered with bandaid.  -Discussed plan of care with patient. -Patient to now begin soaking in a weak solution of Epsom salt and warm water. Patient was instructed to soak for 15-20 minutes each day until the toe appears normal and  there is no drainage, redness, tenderness, or swelling at the procedure site, and apply neosporin and a gauze or bandaid dressing each day as needed. May leave open to air at night. -Educated patient on long term care after nail surgery. -Patient was instrcuted to monitor the toe for reoccurrence and signs of infection; Patient advised to return to office if toe becomes red, hot or swollen. -Patient is to return in 3 months for routine diabetic foot exam/care or sooner if  problems arise.  Landis Martins, DPM

## 2016-02-18 ENCOUNTER — Encounter: Payer: Self-pay | Admitting: Sports Medicine

## 2016-02-18 ENCOUNTER — Ambulatory Visit (INDEPENDENT_AMBULATORY_CARE_PROVIDER_SITE_OTHER): Payer: Medicare Other | Admitting: Sports Medicine

## 2016-02-18 DIAGNOSIS — Z9889 Other specified postprocedural states: Secondary | ICD-10-CM | POA: Diagnosis not present

## 2016-02-18 DIAGNOSIS — E119 Type 2 diabetes mellitus without complications: Secondary | ICD-10-CM

## 2016-02-18 HISTORY — DX: Type 2 diabetes mellitus without complications: E11.9

## 2016-02-18 NOTE — Progress Notes (Signed)
Patient ID: Rhonda Day, female   DOB: Oct 11, 1941, 75 y.o.   MRN: [id number]  Subjective: Rhonda Day is a 75 y.o.  Diabetic female patient returns to office today for follow up evaluation after having Right Hallux lateral permanent nail avulsion performed on 11-15-15. Patient reports that the area has healed and she has returned to getting pedicures of which she prefers. Patient deniesfever/chills/nausea/vomitting/any other related constitutional symptoms at this time.  FBS 158 this am  Patient Active Problem List   Diagnosis Date Noted  . Controlled type 2 diabetes mellitus without complication ([place]) 123456  . Type 2 diabetes mellitus ([place]) 11/15/2015  . Essential (primary) hypertension 11/15/2015  . Acute urinary tract infection 09/14/2014  . Incomplete bladder emptying 11/16/2012  . 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 LBP (low back pain) 11/11/2012  . Bladder compliance low 11/11/2012  . Female genital symptoms 11/11/2012  . Symptoms involving urinary system 11/11/2012   Current Outpatient Prescriptions on File Prior to Visit  Medication Sig Dispense Refill  . aspirin EC 81 MG tablet Take by mouth.    . estradiol (ESTRACE) 0.5 MG tablet   1  . estradiol (ESTRACE) 0.5 MG tablet Take by mouth.    . estrogen, conjugated,-medroxyprogesterone (PREMPRO) 0.3-1.5 MG tablet Take by mouth.    . hydrochlorothiazide (HYDRODIURIL) 25 MG tablet   0  . medroxyPROGESTERone (PROVERA) 2.5 MG tablet   1  . medroxyPROGESTERone (PROVERA) 2.5 MG tablet Take by mouth.    . metFORMIN (GLUCOPHAGE-XR) 500 MG 24 hr tablet   0  . metFORMIN (GLUCOPHAGE-XR) 500 MG 24 hr tablet Take by mouth.    . ONE TOUCH ULTRA TEST test strip USE AS DIRECTED. CHECK CBG'S DAILY AND AS NEEDED IF SYMPTOMATIC  1  . ONETOUCH DELICA LANCETS 99991111 MISC TAKE AS DIRECTED ONCE DAILY AND IF NEEDED  0  . pneumococcal 13-valent conjugate vaccine (PREVNAR 13) SUSP injection     . ULTRACET 37.5-325 MG tablet      No  current facility-administered medications on file prior to visit.   Allergies  Allergen Reactions  . Sulfa Antibiotics Rash    Other reaction(s): RASH    Objective:  General: Well developed, nourished, in no acute distress, alert and oriented x3   Dermatology: Skin is warm, dry and supple bilateral. RIght hallux lateral nail bed appears to be clean, dry and well healed, The remaining nails appear unremarkable at this time. Mild forefoot callus diffuse in nature, There are no other lesions or other signs of infection present.  Neurovascular status: Intact. No lower extremity swelling; No pain with calf compression bilateral.  Musculoskeletal: No tenderness to palpation of the Right hallux lateral nail fold. Muscular strength within normal limits bilateral.   Assesement and Plan: Problem List Items Addressed This Visit    None    Visit Diagnoses    S/P nail surgery    -  Primary    PNA R Hallux lateral margin, 11-15-15, well healed    Diabetes mellitus without complication (HCC)           -Examined patient  -Recommend daily skin emollients (okeeffe healthy feet)  -Patient was instrcuted to monitor the toe for reoccurrence and signs of infection; Patient advised to return to office if toe becomes red, hot or swollen. -Advised patient to take special care when getting pedicures in the setting of diabetes -Recommend good supportive shoes daily -Patient is to return yearly for diabetic foot exam or sooner if problems arise.  Landis Martins, DPM

## 2016-02-18 NOTE — Patient Instructions (Signed)
[place]effe Healthy Feet Cream once daily after shower

## 2016-02-25 ENCOUNTER — Ambulatory Visit: Payer: 59 | Admitting: Sports Medicine

## 2016-03-31 ENCOUNTER — Other Ambulatory Visit
Admission: RE | Admit: 2016-03-31 | Discharge: 2016-03-31 | Disposition: A | Discharge status: Home / Self Care | Payer: Medicare Other | Source: Ambulatory Visit | Attending: Family Medicine | Admitting: Family Medicine

## 2016-03-31 DIAGNOSIS — I480 Paroxysmal atrial fibrillation: Secondary | ICD-10-CM | POA: Insufficient documentation

## 2016-03-31 LAB — TROPONIN I: Troponin I: <0.03 ng/mL (ref <0.031)

## 2016-03-31 LAB — CKMB (ARMC ONLY): CK, MB: 2.4 ng/mL (ref 0.5–5.0)

## 2016-04-09 DIAGNOSIS — Z8679 Personal history of other diseases of the circulatory system: Secondary | ICD-10-CM | POA: Insufficient documentation

## 2016-04-09 HISTORY — DX: Personal history of other diseases of the circulatory system: Z86.79

## 2016-05-11 DIAGNOSIS — Z87898 Personal history of other specified conditions: Secondary | ICD-10-CM

## 2016-05-11 HISTORY — DX: Personal history of other specified conditions: Z87.898

## 2016-09-29 DIAGNOSIS — Z0000 Encounter for general adult medical examination without abnormal findings: Secondary | ICD-10-CM

## 2016-09-29 HISTORY — DX: Encounter for general adult medical examination without abnormal findings: Z00.00

## 2016-12-23 DIAGNOSIS — R300 Dysuria: Secondary | ICD-10-CM

## 2016-12-23 HISTORY — DX: Dysuria: R30.0

## 2016-12-24 ENCOUNTER — Ambulatory Visit (INDEPENDENT_AMBULATORY_CARE_PROVIDER_SITE_OTHER): Payer: Medicare Other | Admitting: Obstetrics & Gynecology

## 2016-12-24 ENCOUNTER — Encounter: Payer: Self-pay | Admitting: Obstetrics & Gynecology

## 2016-12-24 VITALS — BP 138/80 | HR 93 | Ht 69.0 in | Wt 174.0 lb

## 2016-12-24 DIAGNOSIS — N951 Menopausal and female climacteric states: Secondary | ICD-10-CM

## 2016-12-24 HISTORY — DX: Menopausal and female climacteric states: N95.1

## 2016-12-24 MED ORDER — MEDROXYPROGESTERONE ACETATE 2.5 MG PO TABS: 2.5000 mg | ORAL_TABLET | Freq: Every day | ORAL | 12 refills | Status: DC

## 2016-12-24 MED ORDER — ESTRADIOL 0.5 MG PO TABS: 0.5000 mg | ORAL_TABLET | Freq: Every day | ORAL | 12 refills | Status: DC

## 2016-12-24 NOTE — Progress Notes (Signed)
HPI:      Rhonda Day is a 76 y.o.  who is postmenopausal, presents today for a problem visit.  She complains of hot flashes, night sweats.   Symptoms have been present for several years. Symptoms are mod to severe and has led her to come in today to seek options for intervention.  Previous Treatment: HRT< w success.  Tried to wean off but has significant return of sx's.  Denies PostMenopausal Bleeding  PMHx: She  has no past medical history on file. Also,  has no past surgical history on file., family history includes Kidney cancer in her father.,  has an unknown smoking status. She has never used smokeless tobacco.  She has a current medication list which includes the following prescription(s): aspirin, losartan, estradiol, hydrochlorothiazide, medroxyprogesterone, metformin, and ultracet. Also, is allergic to sulfa antibiotics.  Review of Systems  Constitutional: Negative for chills, fever and malaise/fatigue.  HENT: Negative for congestion, sinus pain and sore throat.   Eyes: Negative for blurred vision and pain.  Respiratory: Negative for cough and wheezing.   Cardiovascular: Negative for chest pain and leg swelling.  Gastrointestinal: Negative for abdominal pain, constipation, diarrhea, heartburn, nausea and vomiting.  Genitourinary: Negative for dysuria, frequency, hematuria and urgency.  Musculoskeletal: Negative for back pain, joint pain, myalgias and neck pain.  Skin: Negative for itching and rash.  Neurological: Negative for dizziness, tremors and weakness.  Endo/Heme/Allergies: Does not bruise/bleed easily.  Psychiatric/Behavioral: Negative for depression. The patient is not nervous/anxious and does not have insomnia.     Objective: BP 138/80 (BP Location: Right Arm, Patient Position: Sitting)   Pulse 93   Ht 5\' 9"  (1.753 m)   Wt 174 lb (78.9 kg)   BMI 25.70 kg/m  Physical Exam  Constitutional: She is oriented to person, place, and time. She appears  well-developed and well-nourished. No distress.  Musculoskeletal: Normal range of motion.  Neurological: She is alert and oriented to person, place, and time.  Skin: Skin is warm and dry.  Psychiatric: She has a normal mood and affect.  Vitals reviewed.   ASSESSMENT/PLAN:   1. Menopause syndrome - estradiol (ESTRACE) 0.5 MG tablet; Take 1 tablet (0.5 mg total) by mouth daily.  Dispense: 30 tablet; Refill: 12 - medroxyPROGESTERone (PROVERA) 2.5 MG tablet; Take 1 tablet (2.5 mg total) by mouth daily.  Dispense: 30 tablet; Refill: 12  Patient with bothersome menopausal vasomotor symptoms. Discussed lifestyle interventions such as wearing light clothing, remaining in cool environments, having fan/air conditioner in the room, avoiding hot beverages etc.  Exercise also shown to be significantly helpful in alleviating hot flashes.  Discussed using hormone therapy and concerns about increased risk of heart disease, cerebrovascular disease, thromboembolic disease,  and breast cancer.  Also discussed other medical options such as Clonidine, SSRI, or Neurontin.   Also discussed alternative therapies such as herbal remedies but cautioned that most of the products contained phytoestrogens (plant estrogens) in unregulated amounts which can have the same effects on the body as the pharmaceutical estrogen preparations.  Patient opted for hormonal therapy for now. She will return in 2 months for re-evaluation.  2. F/u for PAP, MMG  Barnett Applebaum, MD, [facility], [facility] 12/24/2016  4:01 PM

## 2017-01-26 DIAGNOSIS — N3020 Other chronic cystitis without hematuria: Secondary | ICD-10-CM

## 2017-01-26 HISTORY — DX: Other chronic cystitis without hematuria: N30.20

## 2017-02-23 ENCOUNTER — Ambulatory Visit (INDEPENDENT_AMBULATORY_CARE_PROVIDER_SITE_OTHER): Payer: Medicare Other | Admitting: Obstetrics & Gynecology

## 2017-02-23 ENCOUNTER — Encounter: Payer: Self-pay | Admitting: Obstetrics & Gynecology

## 2017-02-23 ENCOUNTER — Ambulatory Visit: Payer: Medicare Other | Admitting: Podiatry

## 2017-02-23 VITALS — BP 130/80 | HR 81 | Ht 69.0 in | Wt 175.0 lb

## 2017-02-23 DIAGNOSIS — Z0000 Encounter for general adult medical examination without abnormal findings: Secondary | ICD-10-CM

## 2017-02-23 DIAGNOSIS — Z1239 Encounter for other screening for malignant neoplasm of breast: Secondary | ICD-10-CM

## 2017-02-23 DIAGNOSIS — Z1211 Encounter for screening for malignant neoplasm of colon: Secondary | ICD-10-CM | POA: Diagnosis not present

## 2017-02-23 DIAGNOSIS — Z124 Encounter for screening for malignant neoplasm of cervix: Secondary | ICD-10-CM | POA: Diagnosis not present

## 2017-02-23 DIAGNOSIS — N951 Menopausal and female climacteric states: Secondary | ICD-10-CM

## 2017-02-23 DIAGNOSIS — Z1231 Encounter for screening mammogram for malignant neoplasm of breast: Secondary | ICD-10-CM | POA: Diagnosis not present

## 2017-02-23 MED ORDER — MEDROXYPROGESTERONE ACETATE 2.5 MG PO TABS: 2.5000 mg | ORAL_TABLET | Freq: Every day | ORAL | 12 refills | Status: DC

## 2017-02-23 MED ORDER — ESTRADIOL 0.5 MG PO TABS: 0.5000 mg | ORAL_TABLET | Freq: Every day | ORAL | 12 refills | Status: DC

## 2017-02-23 NOTE — Progress Notes (Signed)
HPI:      [name] is a 76 y.o. G0P0000 who LMP was in the past, she presents today for her annual examination.  The patient has no complaints today. The patient is not sexually active. Herlast pap: was normal and 2016, last mammogram: was normal and colonoscopy normal 2010.  The patient does perform self breast exams.  There is no notable family history of breast or ovarian cancer in her family. The patient is taking hormone replacement therapy. Patient denies post-menopausal vaginal bleeding.   The patient has regular exercise: yes. The patient denies current symptoms of depression.    Improved sx's on HRT that was restarted recently. No neg SE.  GYN Hx: Last Colonoscopy:6 ago. Normal.  Last DEXA: never ago.    PMHx: Past Medical History:  Diagnosis Date  . Bladder infection   . Diabetes mellitus without complication ([place])   . Hypertension    Past Surgical History:  Procedure Laterality Date  . BACK SURGERY    . TUBAL LIGATION     Family History  Problem Relation Age of Onset  . Kidney cancer Father    Social History  Substance Use Topics  . Smoking status: Unknown If Ever Smoked  . Smokeless tobacco: Never Used  . Alcohol use No    Current Outpatient Prescriptions:  .  aspirin 120 MG suppository, Place 120 mg rectally every 6 (six) hours as needed for fever., Disp: , Rfl:  .  estradiol (ESTRACE) 0.5 MG tablet, Take 1 tablet (0.5 mg total) by mouth daily., Disp: 30 tablet, Rfl: 12 .  hydrochlorothiazide (HYDRODIURIL) 25 MG tablet, Take 25 mg by mouth daily., Disp: , Rfl: 0 .  losartan (COZAAR) 100 MG tablet, Take 100 mg by mouth daily., Disp: , Rfl:  .  medroxyPROGESTERone (PROVERA) 2.5 MG tablet, Take 1 tablet (2.5 mg total) by mouth daily., Disp: 30 tablet, Rfl: 12 .  metFORMIN (GLUCOPHAGE-XR) 500 MG 24 hr tablet, take 1 tablet by mouth once daily with DINNER, Disp: , Rfl: 0 .  ULTRACET 37.5-325 MG tablet, , Disp: , Rfl: 0 Allergies: Sulfa  antibiotics  Review of Systems  Constitutional: Negative for chills, fever and malaise/fatigue.  HENT: Negative for congestion, sinus pain and sore throat.   Eyes: Negative for blurred vision and pain.  Respiratory: Negative for cough and wheezing.   Cardiovascular: Negative for chest pain and leg swelling.  Gastrointestinal: Negative for abdominal pain, constipation, diarrhea, heartburn, nausea and vomiting.  Genitourinary: Negative for dysuria, frequency, hematuria and urgency.  Musculoskeletal: Negative for back pain, joint pain, myalgias and neck pain.  Skin: Negative for itching and rash.  Neurological: Negative for dizziness, tremors and weakness.  Endo/Heme/Allergies: Does not bruise/bleed easily.  Psychiatric/Behavioral: Negative for depression. The patient is not nervous/anxious and does not have insomnia.     Objective: BP 130/80   Pulse 81   Ht 5\' 9"  (1.753 m)   Wt 175 lb (79.4 kg)   BMI 25.84 kg/m   Filed Weights   02/23/17 1444  Weight: 175 lb (79.4 kg)   Body mass index is 25.84 kg/m. Physical Exam  Constitutional: She is oriented to person, place, and time. She appears well-developed and well-nourished. No distress.  Genitourinary: Rectum normal, vagina normal and uterus normal. Pelvic exam was performed with patient supine. There is no rash or lesion on the right labia. There is no rash or lesion on the left labia. Vagina exhibits no lesion. No bleeding in the vagina. Right adnexum does  not display mass and does not display tenderness. Left adnexum does not display mass and does not display tenderness. Cervix does not exhibit motion tenderness, lesion, friability or polyp.   Uterus is mobile and midaxial. Uterus is not enlarged or exhibiting a mass.  HENT:  Head: Normocephalic and atraumatic. Head is without laceration.  Right Ear: Hearing normal.  Left Ear: Hearing normal.  Nose: No epistaxis.  No foreign bodies.  Mouth/Throat: Uvula is midline, oropharynx is  clear and moist and mucous membranes are normal.  Eyes: Pupils are equal, round, and reactive to light.  Neck: Normal range of motion. Neck supple. No thyromegaly present.  Cardiovascular: Normal rate and regular rhythm.  Exam reveals no gallop and no friction rub.   No murmur heard. Pulmonary/Chest: Effort normal and breath sounds normal. No respiratory distress. She has no wheezes. Right breast exhibits no mass, no skin change and no tenderness. Left breast exhibits no mass, no skin change and no tenderness.  Abdominal: Soft. Bowel sounds are normal. She exhibits no distension. There is no tenderness. There is no rebound.  Musculoskeletal: Normal range of motion.  Neurological: She is alert and oriented to person, place, and time. No cranial nerve deficit.  Skin: Skin is warm and dry.  Psychiatric: She has a normal mood and affect. Judgment normal.  Vitals reviewed.   Assessment: Annual Exam 1. Menopause syndrome   2. Annual physical exam   3. Screening for breast cancer   4. Screening for cervical cancer   5. Screen for colon cancer     Plan:            1.  Cervical Screening-  Pap smear done today, Pap smear schedule reviewed with patient  2. Breast screening- Exam annually and mammogram scheduled  3. Colonoscopy every 10 years, Hemoccult testing after age 65  4. Labs managed by PCP  5. Counseling for hormonal therapy: no change in therapy today     F/U  Return in about 1 year (around 02/23/2018) for Annual.  Barnett Applebaum, MD, [facility], [facility] 02/23/2017  3:07 PM

## 2017-02-23 NOTE — Patient Instructions (Signed)
PAP every three years Mammogram every year Colonoscopy every 10 years Labs yearly (with PCP)   

## 2017-02-26 LAB — IGP, APTIMA HPV
HPV Aptima: NEGATIVE
PAP SMEAR COMMENT: 0

## 2017-02-26 LAB — FECAL OCCULT BLOOD, IMMUNOCHEMICAL: Fecal Occult Bld: NEGATIVE

## 2017-04-05 ENCOUNTER — Encounter: Payer: Self-pay | Admitting: Radiology

## 2017-04-05 ENCOUNTER — Ambulatory Visit
Admission: RE | Admit: 2017-04-05 | Discharge: 2017-04-05 | Disposition: A | Discharge status: Home / Self Care | Payer: Medicare Other | Source: Ambulatory Visit | Attending: Obstetrics & Gynecology | Admitting: Obstetrics & Gynecology

## 2017-04-05 DIAGNOSIS — Z1231 Encounter for screening mammogram for malignant neoplasm of breast: Secondary | ICD-10-CM | POA: Insufficient documentation

## 2017-04-05 DIAGNOSIS — Z1239 Encounter for other screening for malignant neoplasm of breast: Secondary | ICD-10-CM

## 2017-04-05 IMAGING — MG MM DIGITAL SCREENING BILAT W/ TOMO W/ CAD
9 of 13 series · 9 of 29 positions shown · non-contrast
Comparison: Previous exam(s).

CLINICAL DATA: Screening.

EXAM:
2D DIGITAL SCREENING BILATERAL MAMMOGRAM WITH CAD AND ADJUNCT TOMO

[R CC (1 of 2)]
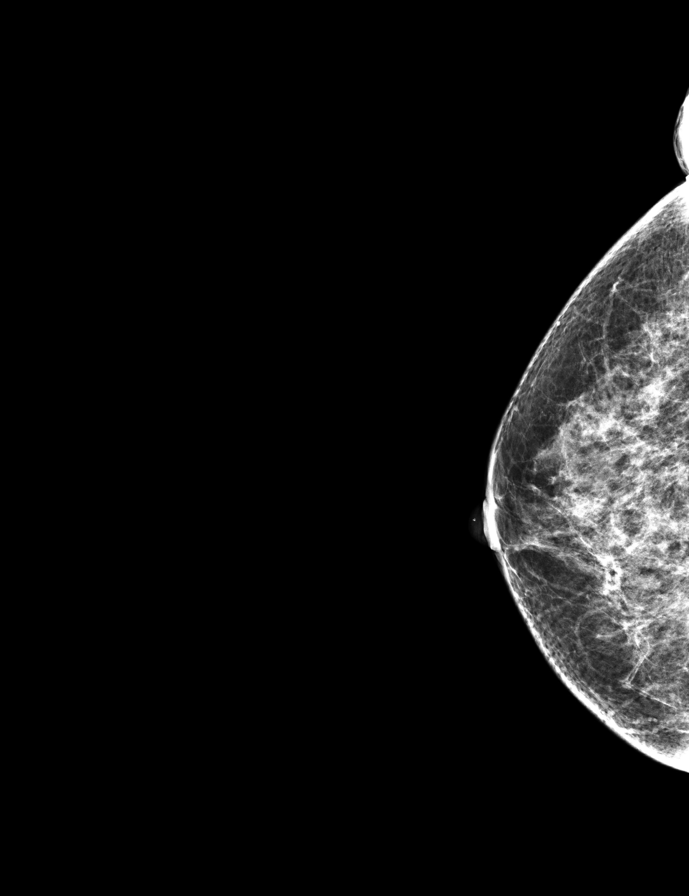

[L CC]
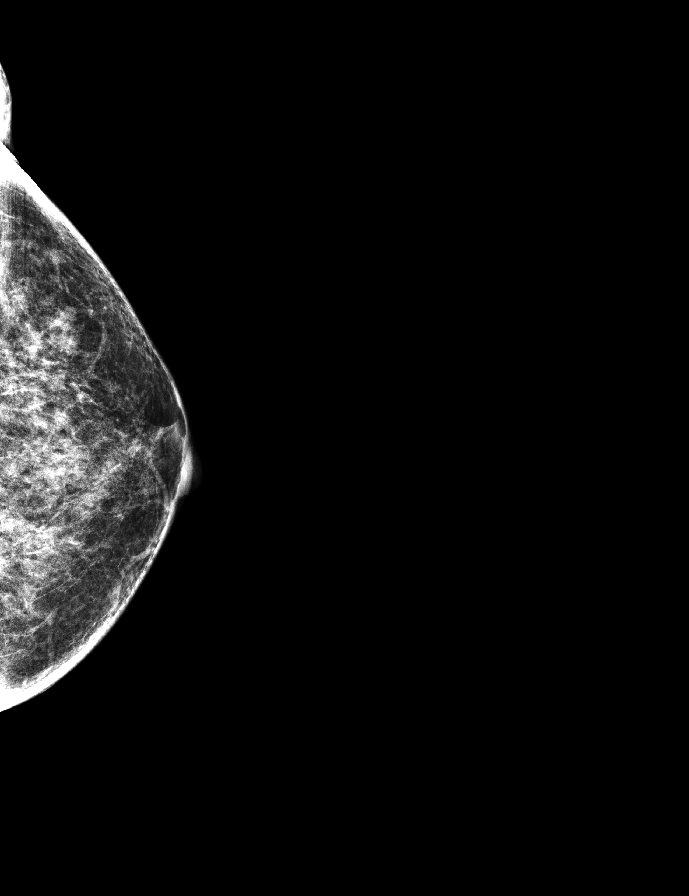

[R CC (2 of 2)]
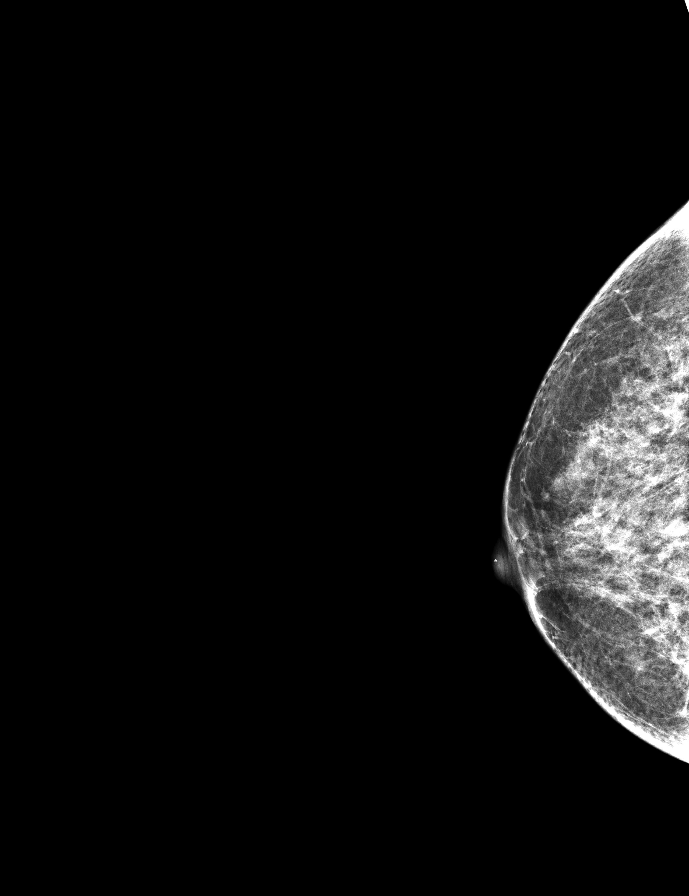

[L MLO]
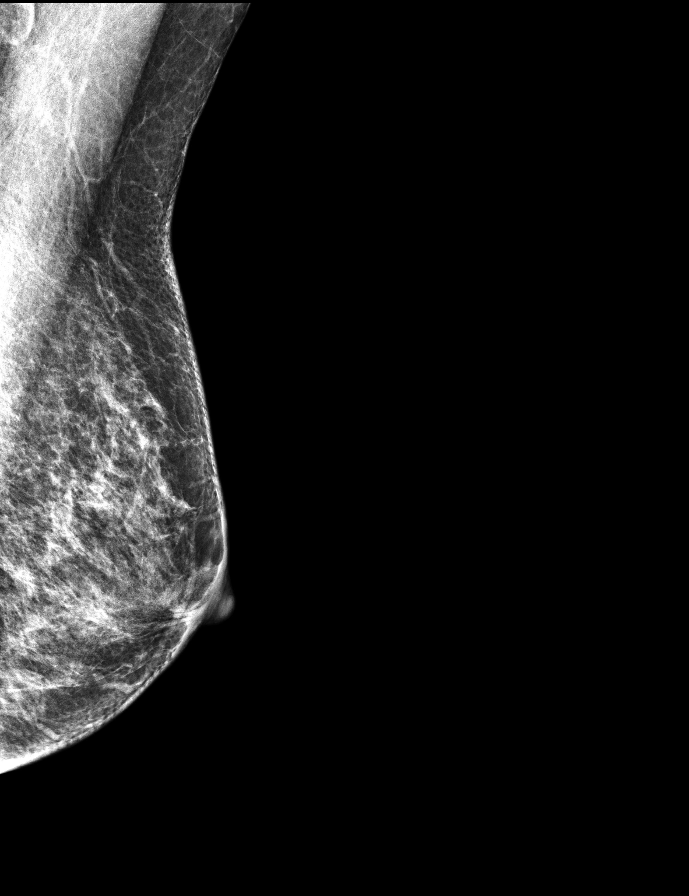

[L CC synth-2D]
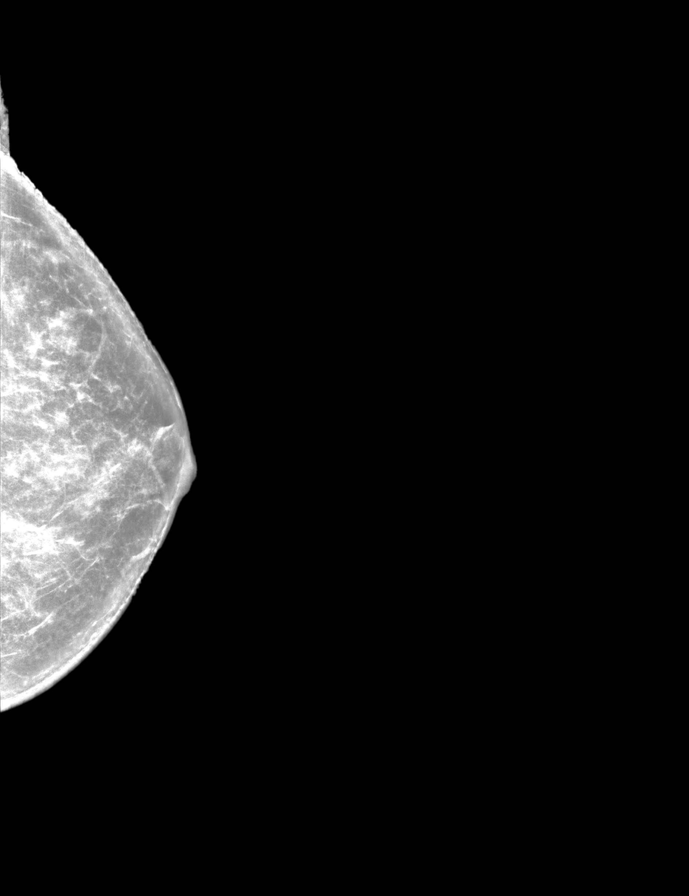

[R CC synth-2D]
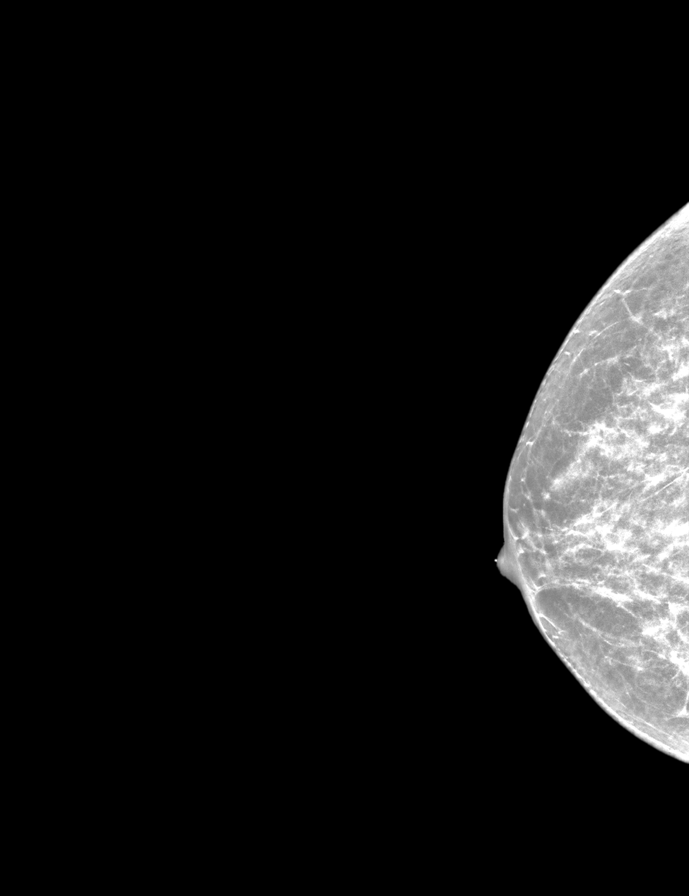

[R MLO]
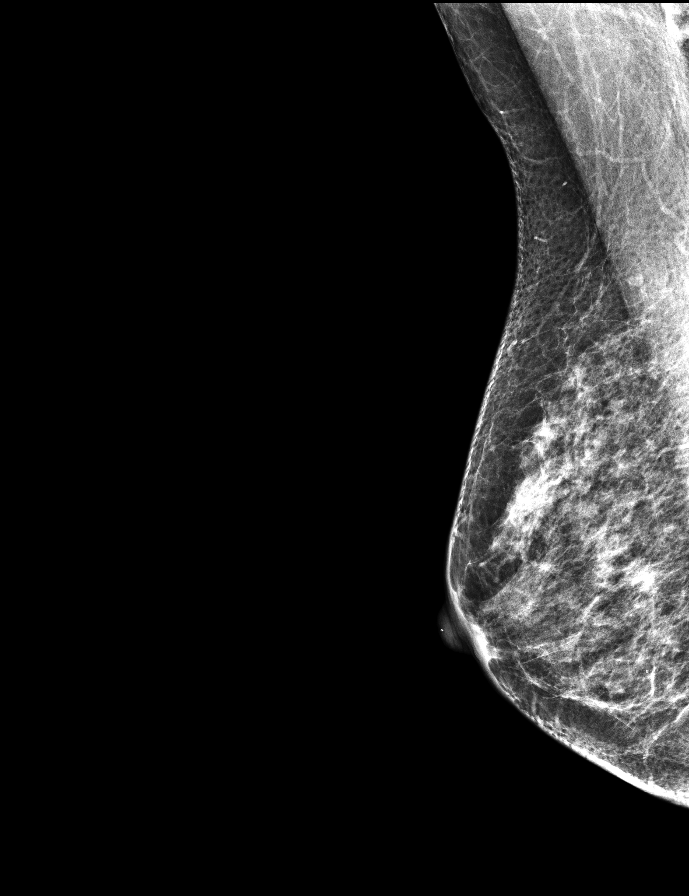

[R MLO synth-2D]
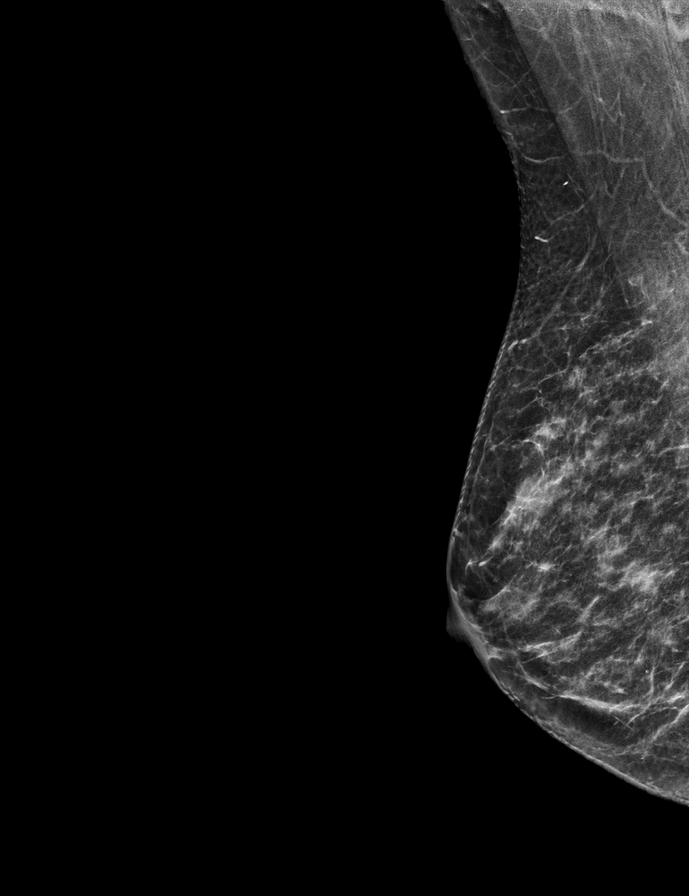

[L MLO synth-2D]
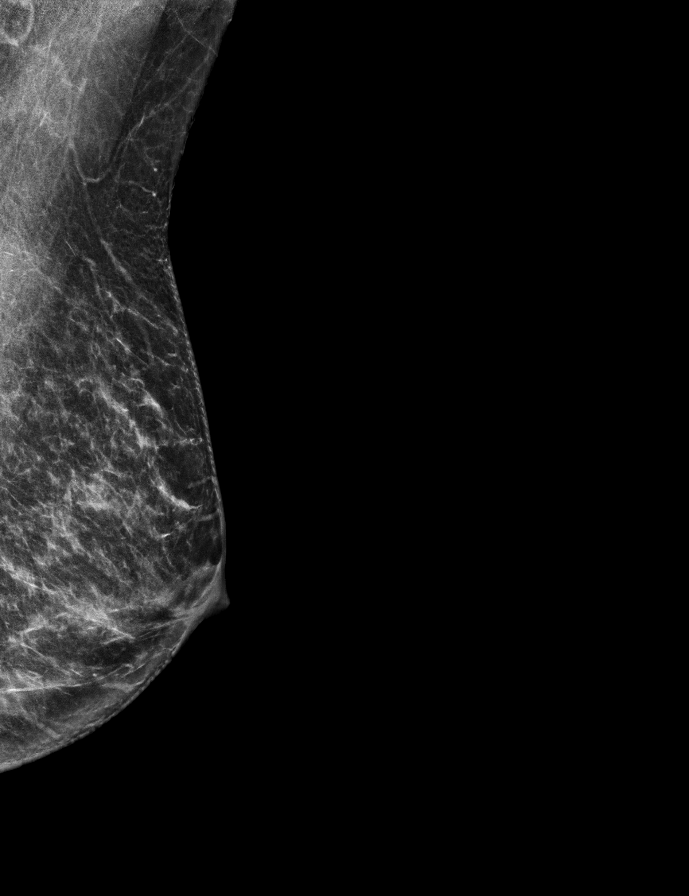

[9 of 29 positions shown; findings below may reference images not displayed]

ACR Breast Density Category b: There are scattered areas of
fibroglandular density.
FINDINGS: There are no findings suspicious for malignancy. Images were
processed with CAD.
IMPRESSION: No mammographic evidence of malignancy. A result letter of this
screening mammogram will be mailed directly to the patient.

RECOMMENDATION:
Screening mammogram in one year. (Code:[33])

BI-RADS CATEGORY  1: Negative.

## 2017-04-09 ENCOUNTER — Inpatient Hospital Stay
Admission: RE | Admit: 2017-04-09 | Discharge: 2017-04-09 | Disposition: A | Discharge status: Home / Self Care | Payer: Self-pay | Source: Ambulatory Visit | Attending: *Deleted | Admitting: *Deleted

## 2017-04-09 ENCOUNTER — Encounter: Payer: Self-pay | Admitting: Obstetrics & Gynecology

## 2017-04-09 ENCOUNTER — Other Ambulatory Visit: Payer: Self-pay | Admitting: *Deleted

## 2017-04-09 DIAGNOSIS — Z9289 Personal history of other medical treatment: Secondary | ICD-10-CM

## 2017-05-11 DIAGNOSIS — N939 Abnormal uterine and vaginal bleeding, unspecified: Secondary | ICD-10-CM

## 2017-05-11 DIAGNOSIS — D280 Benign neoplasm of vulva: Secondary | ICD-10-CM

## 2017-05-11 HISTORY — DX: Benign neoplasm of vulva: D28.0

## 2017-05-11 HISTORY — DX: Abnormal uterine and vaginal bleeding, unspecified: N93.9

## 2017-09-30 DIAGNOSIS — E538 Deficiency of other specified B group vitamins: Secondary | ICD-10-CM | POA: Insufficient documentation

## 2017-09-30 HISTORY — DX: Deficiency of other specified B group vitamins: E53.8

## 2017-12-03 ENCOUNTER — Encounter: Payer: Self-pay | Admitting: Podiatry

## 2017-12-03 ENCOUNTER — Ambulatory Visit: Payer: Medicare Other | Admitting: Podiatry

## 2017-12-03 DIAGNOSIS — L600 Ingrowing nail: Secondary | ICD-10-CM

## 2017-12-03 MED ORDER — GENTAMICIN SULFATE 0.1 % EX CREA: 1.0000 "application " | TOPICAL_CREAM | Freq: Two times a day (BID) | CUTANEOUS | 0 refills | Status: DC

## 2017-12-03 NOTE — Patient Instructions (Addendum)

## 2017-12-06 NOTE — Progress Notes (Signed)
   Subjective: Patient presents today for evaluation of pain to the medial and lateral borders of the right great toe that began gradually over the past 3-4 months. She reports associated erythema and swelling. Patient is concerned for possible ingrown nail. Applying pressure to the nail increases the pain. She has tried trimming the nail herself with no significant relief. She states Dr. Cannon Kettle removed the lateral border in 2017. Patient presents today for further treatment and evaluation.  Past Medical History:  Diagnosis Date  . Bladder infection   . Diabetes mellitus without complication ([place])   . Hypertension     Objective:  General: Well developed, nourished, in no acute distress, alert and oriented x3   Dermatology: Skin is warm, dry and supple bilateral. Medial and lateral borders of the right great toe appears to be erythematous with evidence of an ingrowing nail. Pain on palpation noted to the border of the nail fold. The remaining nails appear unremarkable at this time. There are no open sores, lesions.  Vascular: Dorsalis Pedis artery and Posterior Tibial artery pedal pulses palpable. No lower extremity edema noted.   Neruologic: Grossly intact via light touch bilateral.  Musculoskeletal: Muscular strength within normal limits in all groups bilateral. Normal range of motion noted to all pedal and ankle joints.   Assesement: #1 Paronychia with ingrowing nail medial and lateral borders of the right great toe #2 Pain in toe #3 Incurvated nail  Plan of Care:  1. Patient evaluated.  2. Discussed treatment alternatives and plan of care. Explained nail avulsion procedure and post procedure course to patient. 3. Patient opted for permanent partial nail avulsion.  4. Prior to procedure, local anesthesia infiltration utilized using 3 ml of a 50:50 mixture of 2% plain lidocaine and 0.5% plain marcaine in a normal hallux block fashion and a betadine prep performed.  5. Partial  permanent nail avulsion with chemical matrixectomy performed using [id number] applications of phenol followed by alcohol flush.  6. Light dressing applied. 7. Prescription for gentamicin cream provided to patient.  8. Return to clinic in 2 weeks.   Edrick Kins, DPM [facility]  Dr. Edrick Kins, [address]                                        [place], [place] [address]                Office [phone]  Fax [phone]

## 2017-12-16 DIAGNOSIS — M5136 Other intervertebral disc degeneration, lumbar region: Secondary | ICD-10-CM | POA: Insufficient documentation

## 2017-12-16 DIAGNOSIS — M51369 Other intervertebral disc degeneration, lumbar region without mention of lumbar back pain or lower extremity pain: Secondary | ICD-10-CM

## 2017-12-16 DIAGNOSIS — G894 Chronic pain syndrome: Secondary | ICD-10-CM | POA: Insufficient documentation

## 2017-12-16 DIAGNOSIS — M961 Postlaminectomy syndrome, not elsewhere classified: Secondary | ICD-10-CM

## 2017-12-16 HISTORY — DX: Other intervertebral disc degeneration, lumbar region without mention of lumbar back pain or lower extremity pain: M51.369

## 2017-12-16 HISTORY — DX: Other intervertebral disc degeneration, lumbar region: M51.36

## 2017-12-16 HISTORY — DX: Postlaminectomy syndrome, not elsewhere classified: M96.1

## 2017-12-16 HISTORY — DX: Chronic pain syndrome: G89.4

## 2017-12-21 ENCOUNTER — Encounter: Payer: Self-pay | Admitting: Podiatry

## 2017-12-21 ENCOUNTER — Ambulatory Visit (INDEPENDENT_AMBULATORY_CARE_PROVIDER_SITE_OTHER): Payer: Medicare Other | Admitting: Podiatry

## 2017-12-21 DIAGNOSIS — L600 Ingrowing nail: Secondary | ICD-10-CM

## 2017-12-23 NOTE — Progress Notes (Signed)
   Subjective: Patient presents today 2 weeks post ingrown nail permanent nail avulsion procedure of the medial and lateral borders of the right great toe. Patient states that the toe and nail fold is feeling much better. She reports some erythema to the areas but states it has improved. Patient is here for further evaluation and treatment.   Past Medical History:  Diagnosis Date  . Bladder infection   . Diabetes mellitus without complication ([place])   . Hypertension     Objective: Skin is warm, dry and supple. Nail and respective nail fold appears to be healing appropriately. Open wound to the associated nail fold with a granular wound base and moderate amount of fibrotic tissue. Minimal drainage noted. Mild erythema around the periungual region likely due to phenol chemical matricectomy.  Assessment: #1 postop permanent partial nail avulsion medial and lateral borders of the right great toe #2 open wound periungual nail fold of respective digit.   Plan of care: #1 patient was evaluated  #2 debridement of open wound was performed to the periungual border of the respective toe using a currette. Antibiotic ointment and Band-Aid was applied. #3 patient is to return to clinic on a PRN basis.   Edrick Kins, DPM [facility]  Dr. Edrick Kins, [address]                                        [place], [place] [address]                Office [phone]  Fax [phone]

## 2018-03-01 ENCOUNTER — Other Ambulatory Visit: Payer: Self-pay | Admitting: Family Medicine

## 2018-03-01 DIAGNOSIS — Z1231 Encounter for screening mammogram for malignant neoplasm of breast: Secondary | ICD-10-CM

## 2018-04-06 ENCOUNTER — Ambulatory Visit
Admission: RE | Admit: 2018-04-06 | Discharge: 2018-04-06 | Disposition: A | Discharge status: Home / Self Care | Payer: Medicare Other | Source: Ambulatory Visit | Attending: Family Medicine | Admitting: Family Medicine

## 2018-04-06 DIAGNOSIS — Z1231 Encounter for screening mammogram for malignant neoplasm of breast: Secondary | ICD-10-CM | POA: Insufficient documentation

## 2018-04-06 IMAGING — MG MM DIGITAL SCREENING BILAT W/ TOMO W/ CAD
8 series · 9 of 24 positions shown · non-contrast
Comparison: Previous exam(s).

CLINICAL DATA: Screening.

EXAM:
DIGITAL SCREENING BILATERAL MAMMOGRAM WITH TOMO AND CAD

[L MLO synth-2D]
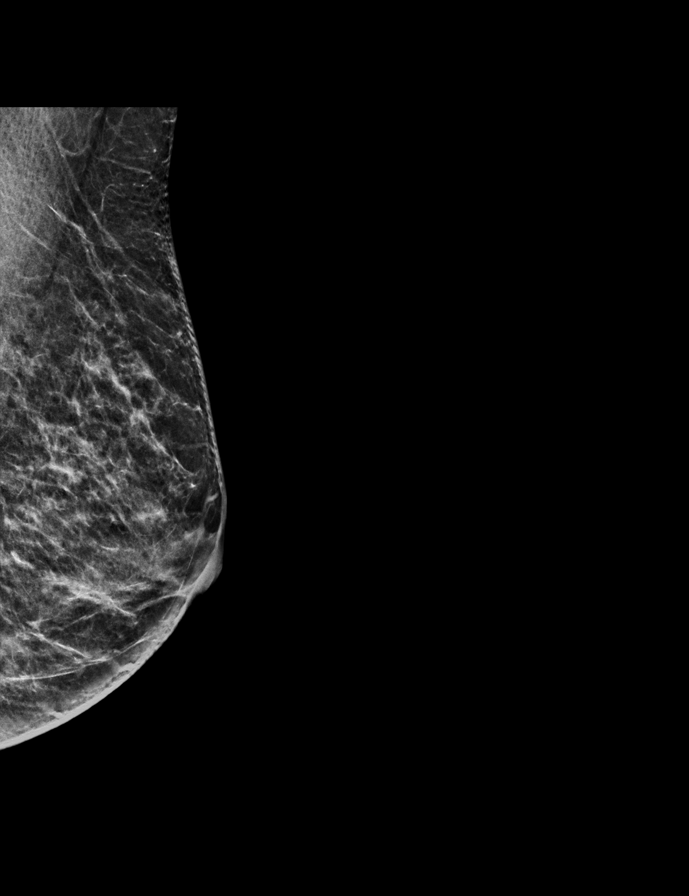

[R CC synth-2D]
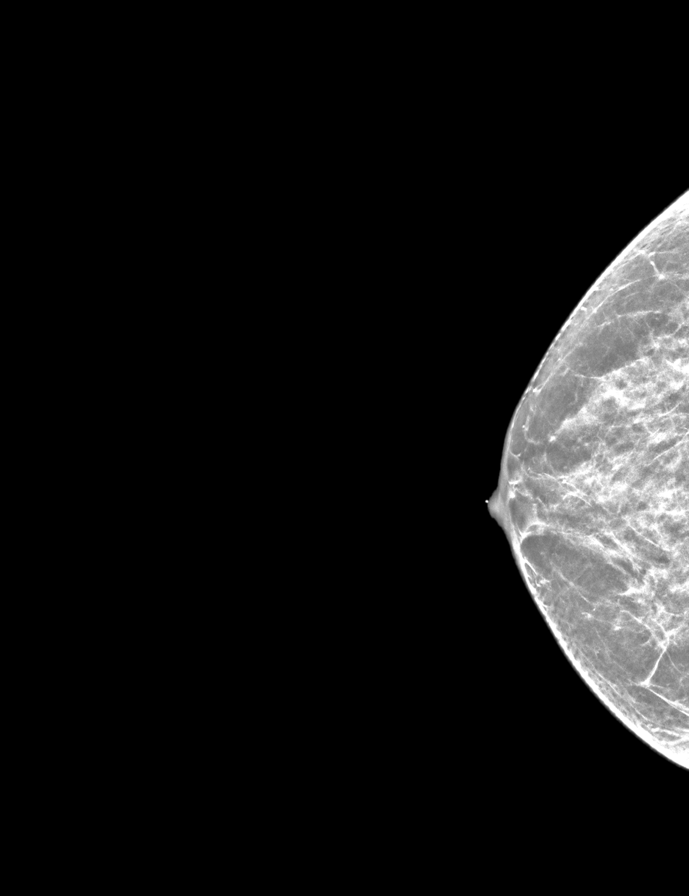

[L CC synth-2D]
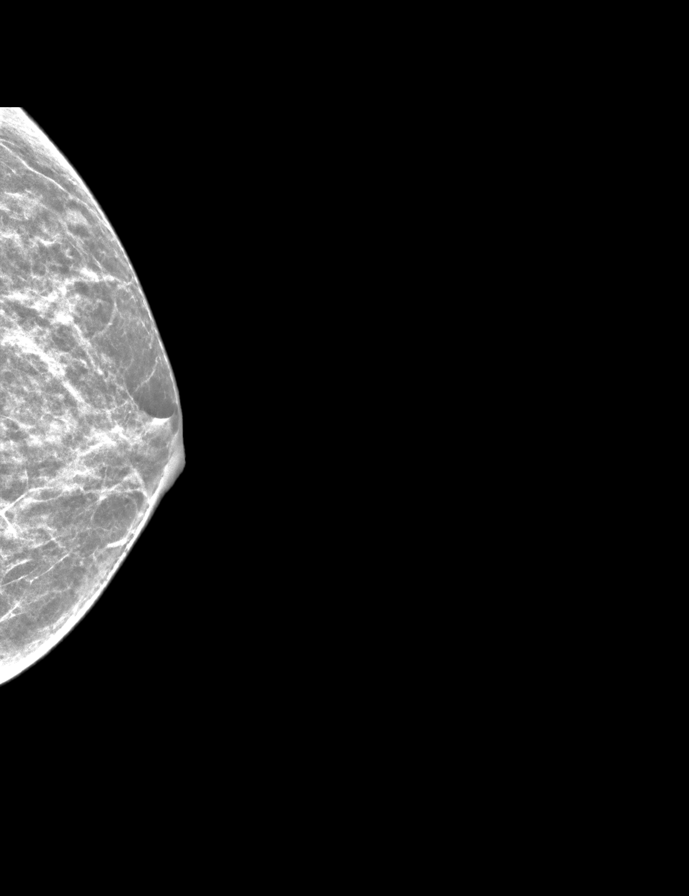

[R MLO synth-2D]
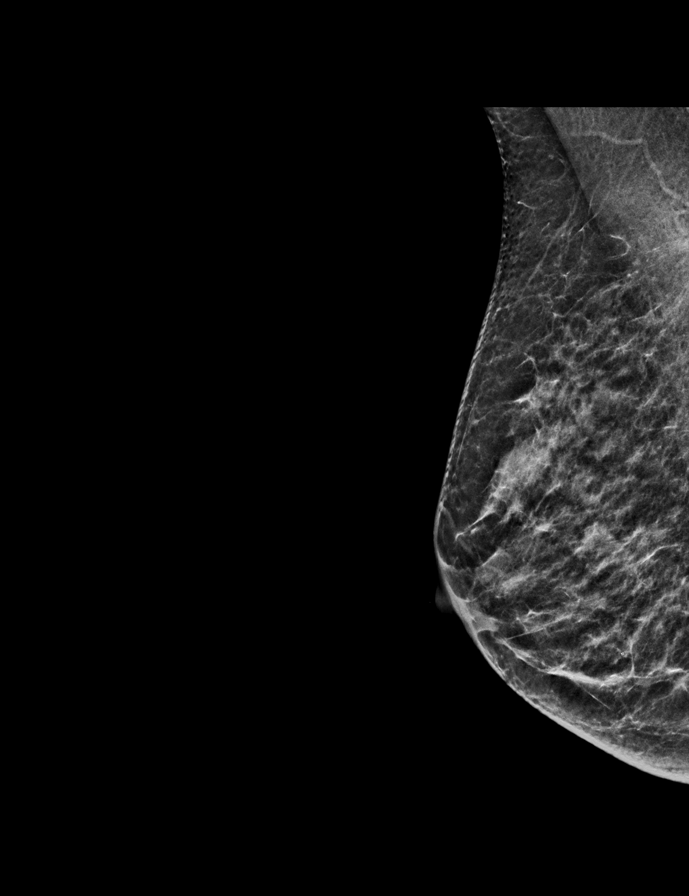

[L MLO tomo · 2 of 50 frames shown]
[frame 17/50]
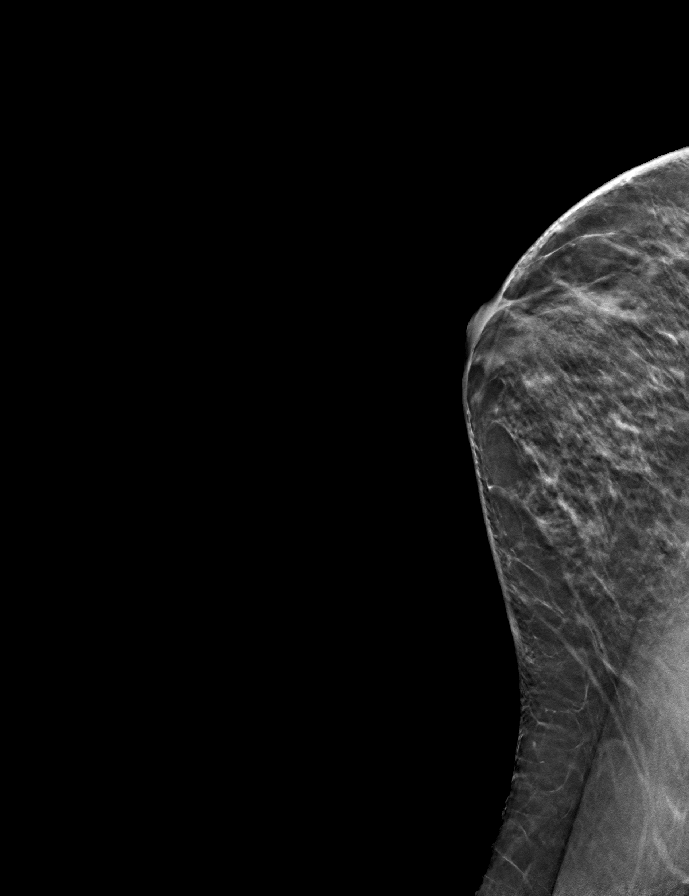
[frame 25/50]
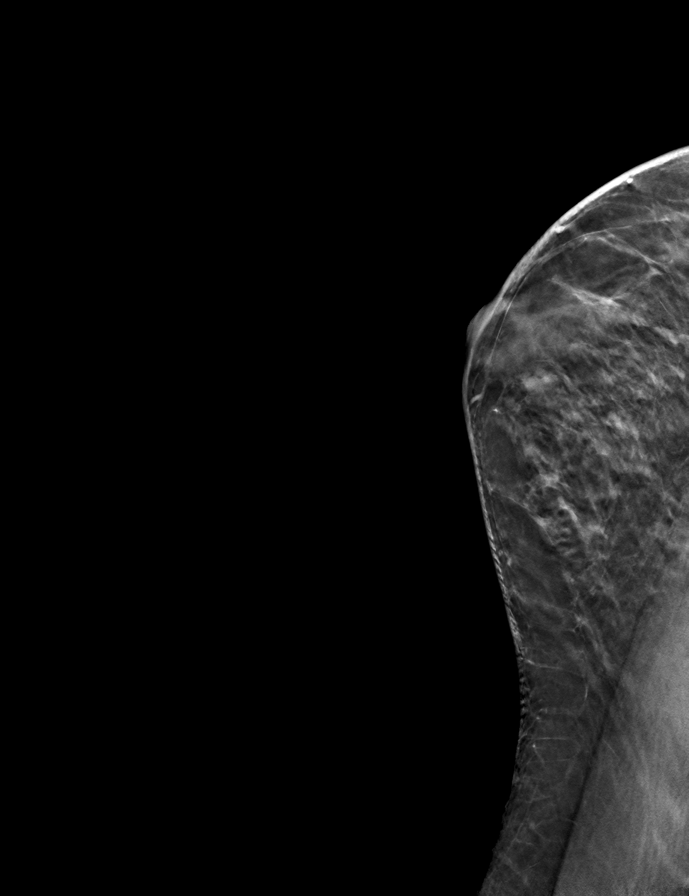

[R MLO tomo · tomo slice 23/46.0]
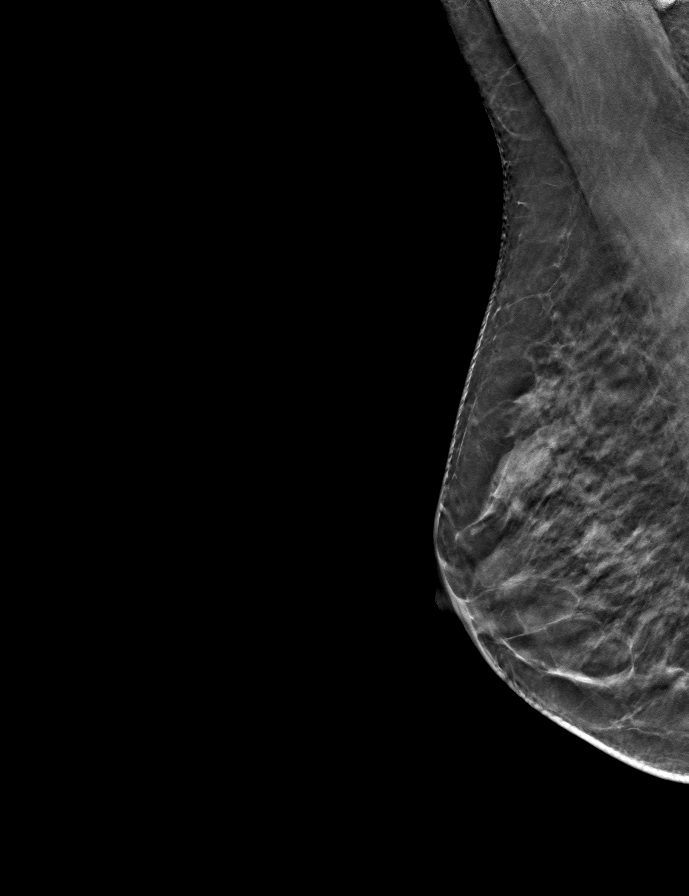

[L CC tomo · tomo slice 23/46.0]
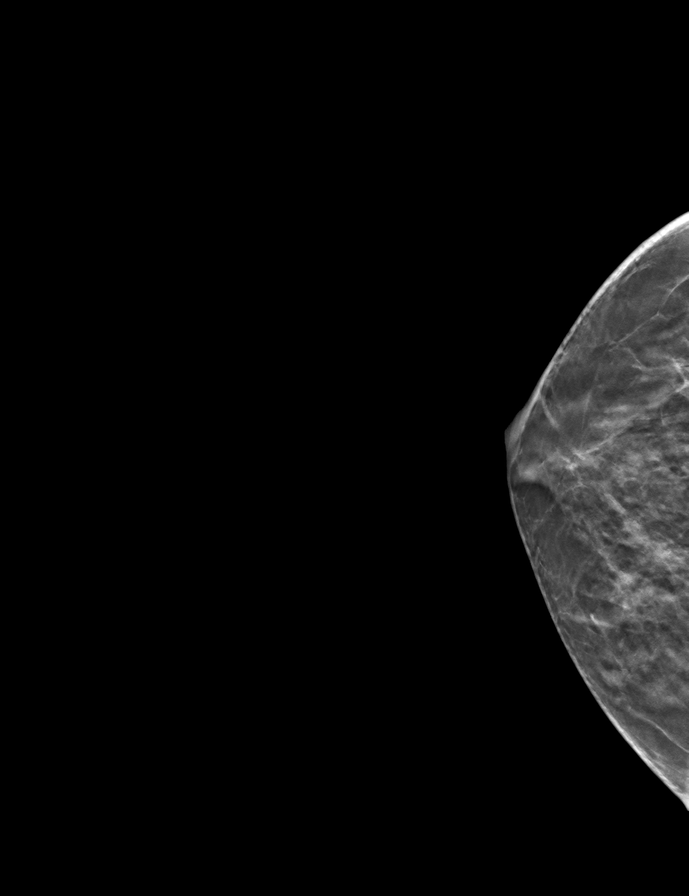

[R CC tomo · tomo slice 24/47.0]
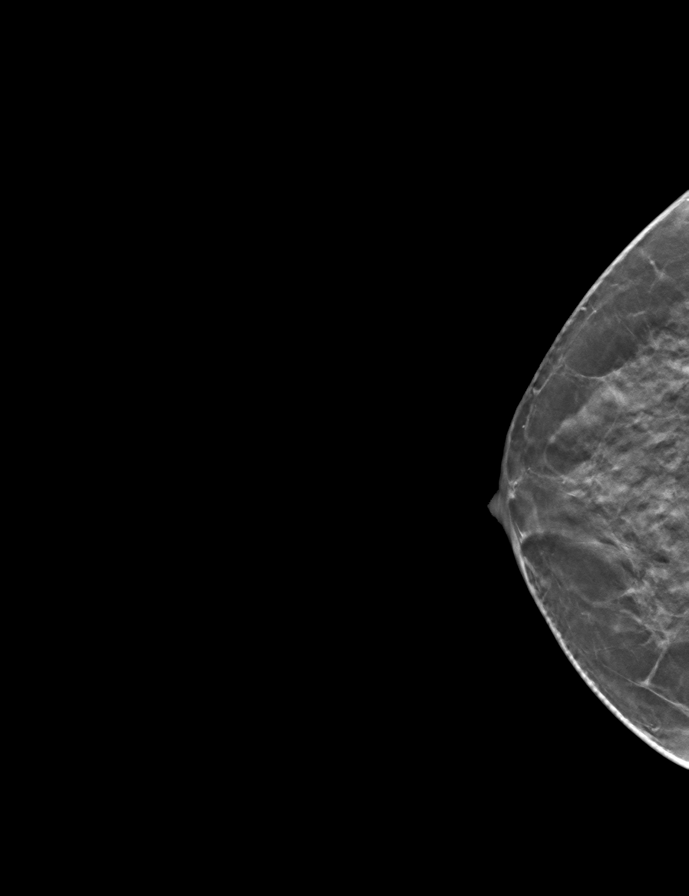

[9 of 24 positions shown; findings below may reference images not displayed]

ACR Breast Density Category c: The breast tissue is heterogeneously
dense, which may obscure small masses.
FINDINGS: There are no findings suspicious for malignancy. Images were
processed with CAD.
IMPRESSION: No mammographic evidence of malignancy. A result letter of this
screening mammogram will be mailed directly to the patient.

RECOMMENDATION:
Screening mammogram in one year. (Code:[5V])

BI-RADS CATEGORY  1: Negative.

## 2018-06-03 DIAGNOSIS — M5136 Other intervertebral disc degeneration, lumbar region: Secondary | ICD-10-CM | POA: Insufficient documentation

## 2019-05-17 ENCOUNTER — Other Ambulatory Visit: Payer: Self-pay | Admitting: Family Medicine

## 2019-05-17 DIAGNOSIS — Z1231 Encounter for screening mammogram for malignant neoplasm of breast: Secondary | ICD-10-CM

## 2019-05-22 ENCOUNTER — Other Ambulatory Visit: Payer: Self-pay

## 2019-05-22 ENCOUNTER — Ambulatory Visit
Admission: RE | Admit: 2019-05-22 | Discharge: 2019-05-22 | Disposition: A | Discharge status: Home / Self Care | Payer: Medicare Other | Source: Ambulatory Visit | Attending: Family Medicine | Admitting: Family Medicine

## 2019-05-22 DIAGNOSIS — Z1231 Encounter for screening mammogram for malignant neoplasm of breast: Secondary | ICD-10-CM | POA: Diagnosis not present

## 2019-05-22 IMAGING — MG DIGITAL SCREENING BILATERAL MAMMOGRAM WITH TOMO AND CAD
8 series · 9 of 24 positions shown · non-contrast
Comparison: Previous exam(s).

CLINICAL DATA: Screening.

EXAM:
DIGITAL SCREENING BILATERAL MAMMOGRAM WITH TOMO AND CAD

[R CC synth-2D]
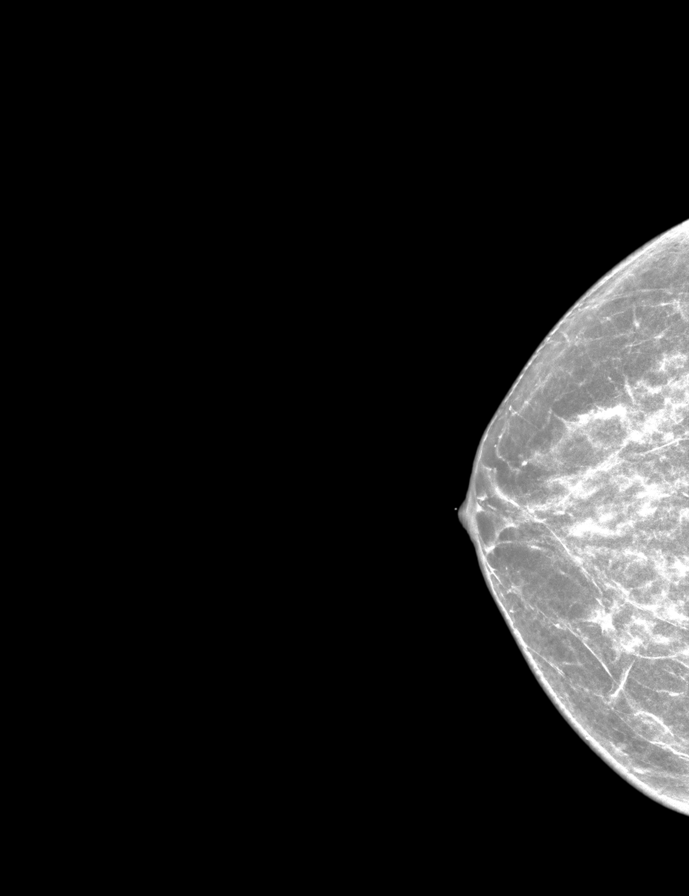

[R MLO synth-2D]
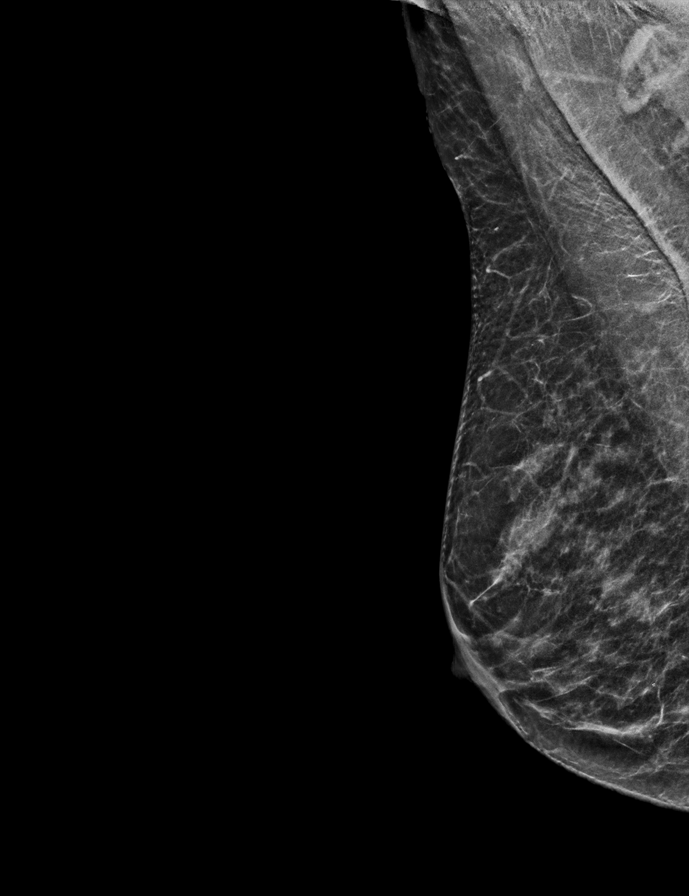

[L CC synth-2D]
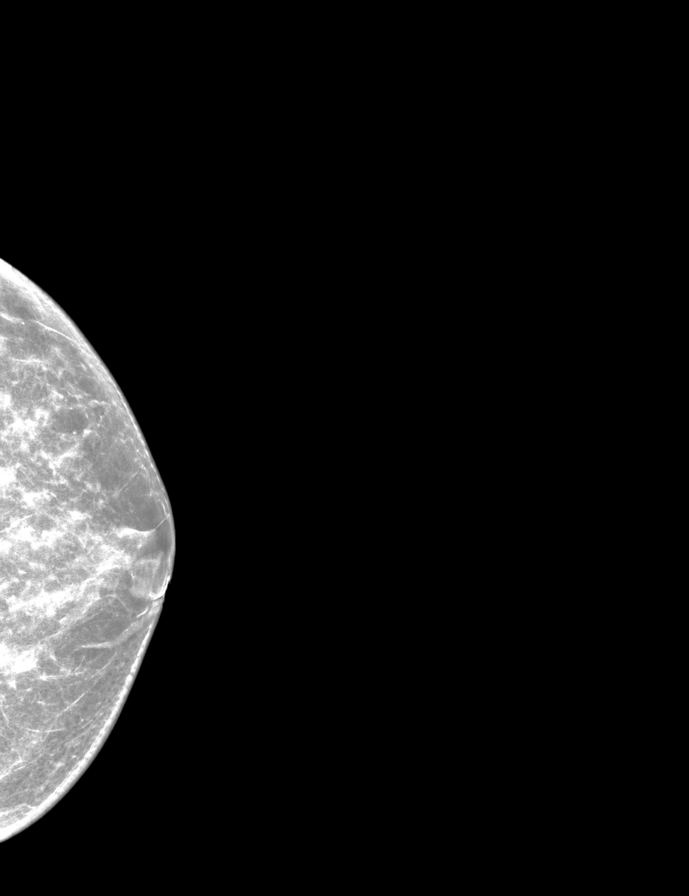

[L MLO synth-2D]
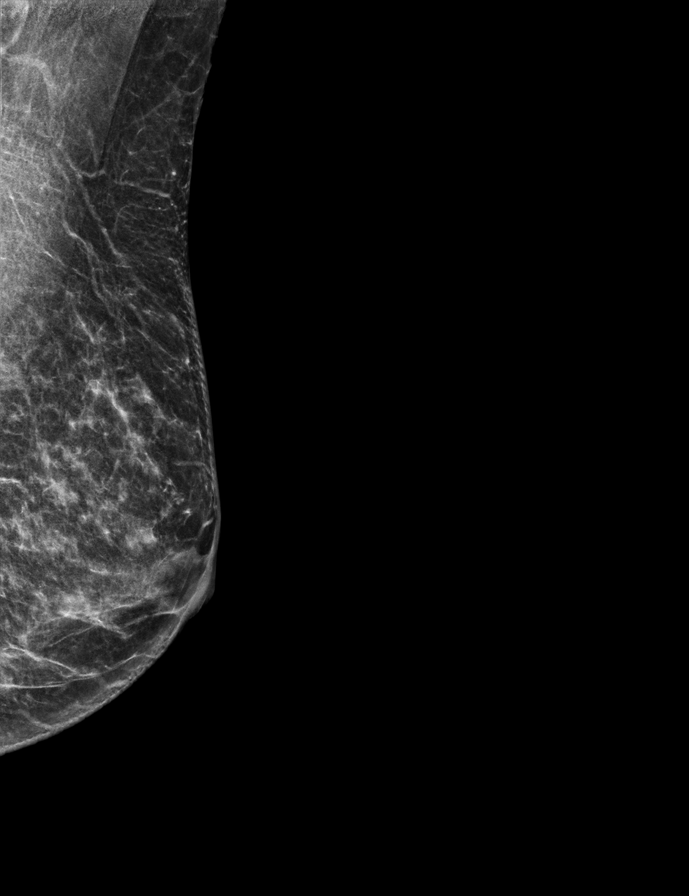

[L CC tomo · 2 of 49 frames shown]
[frame 16/49]
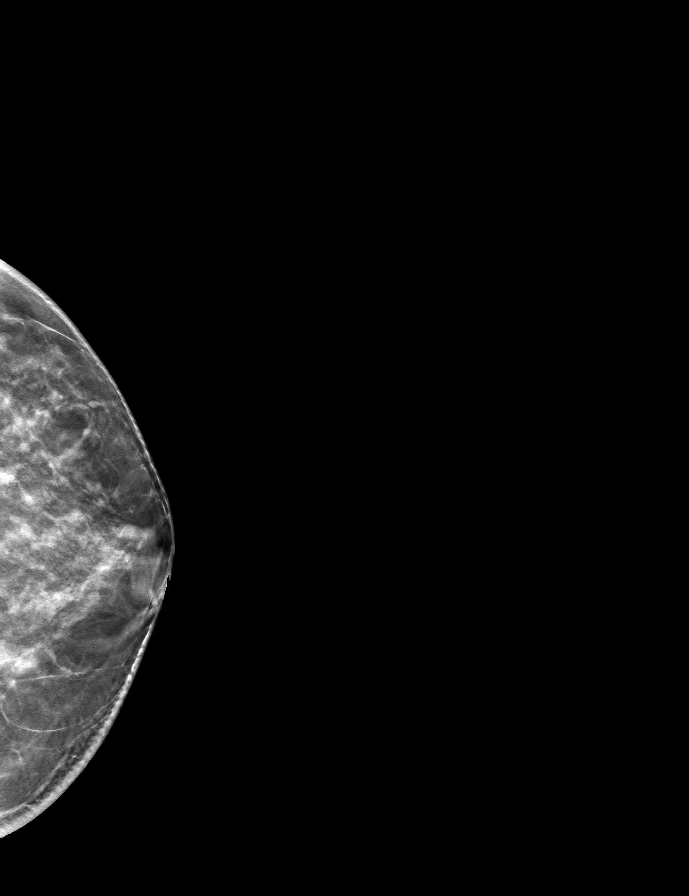
[frame 25/49]
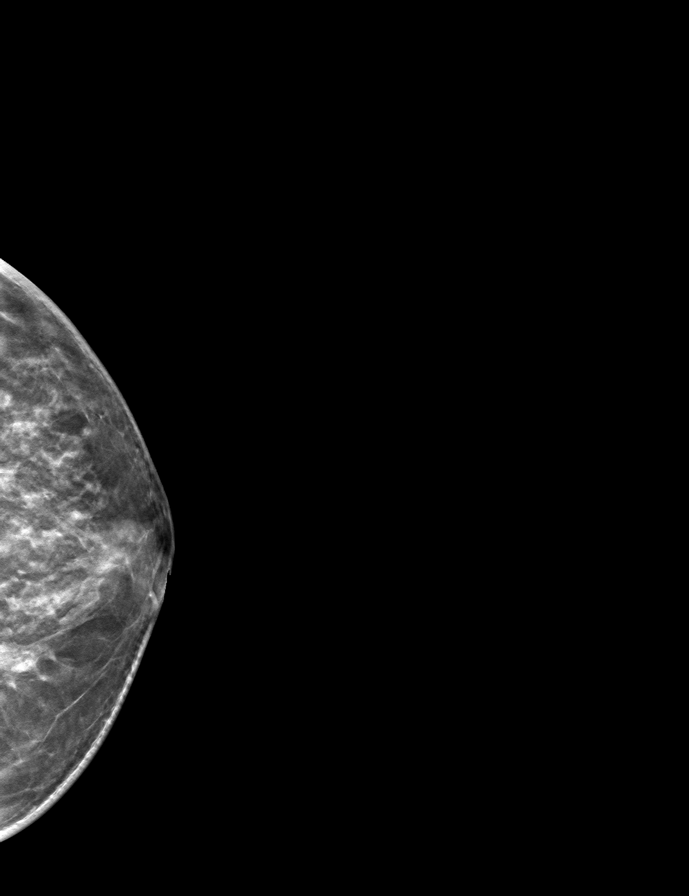

[R CC tomo · tomo slice 25/48.0]
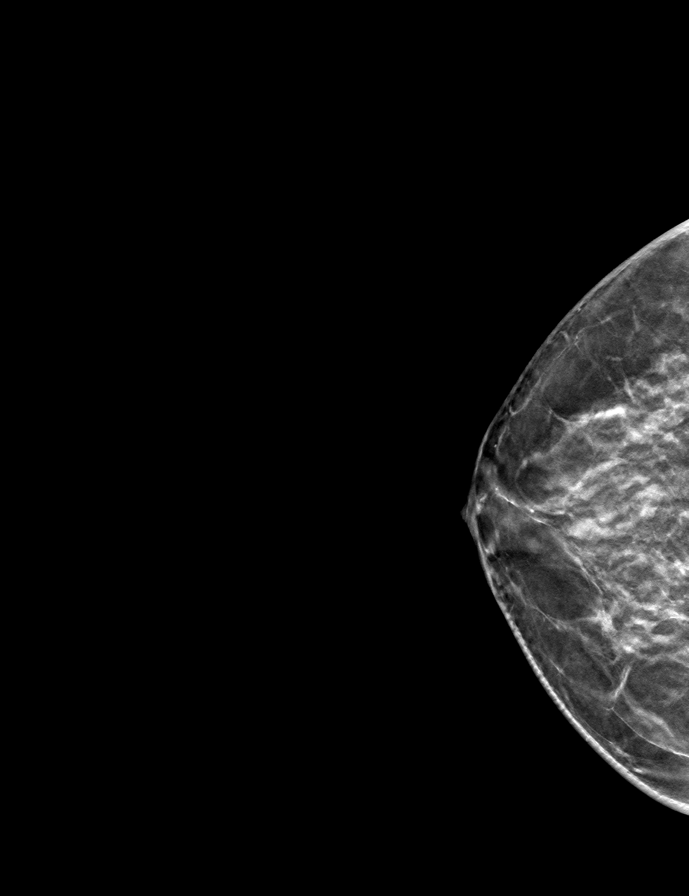

[R MLO tomo · tomo slice 25/48.0]
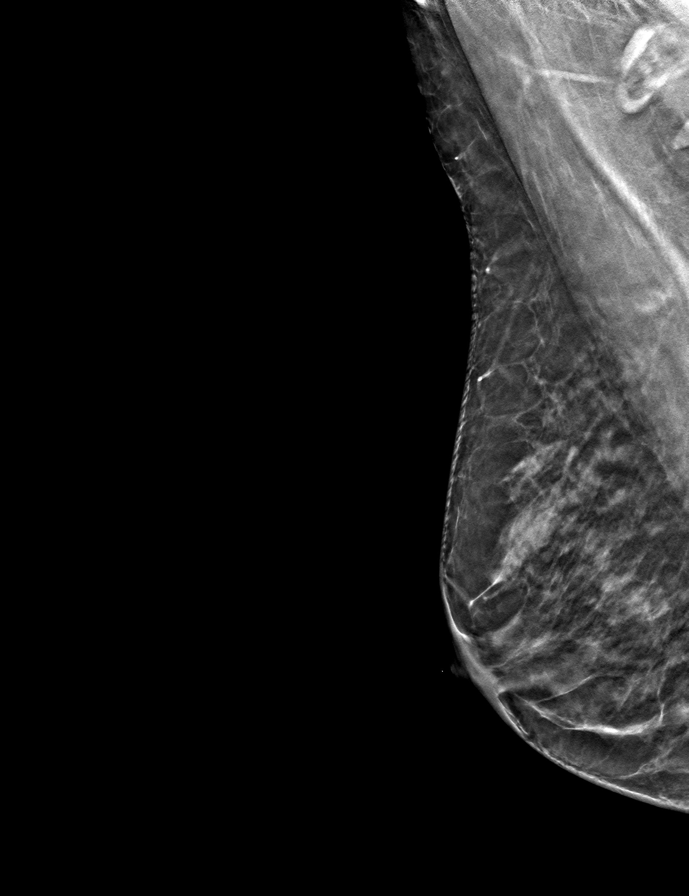

[L MLO tomo · tomo slice 26/51.0]
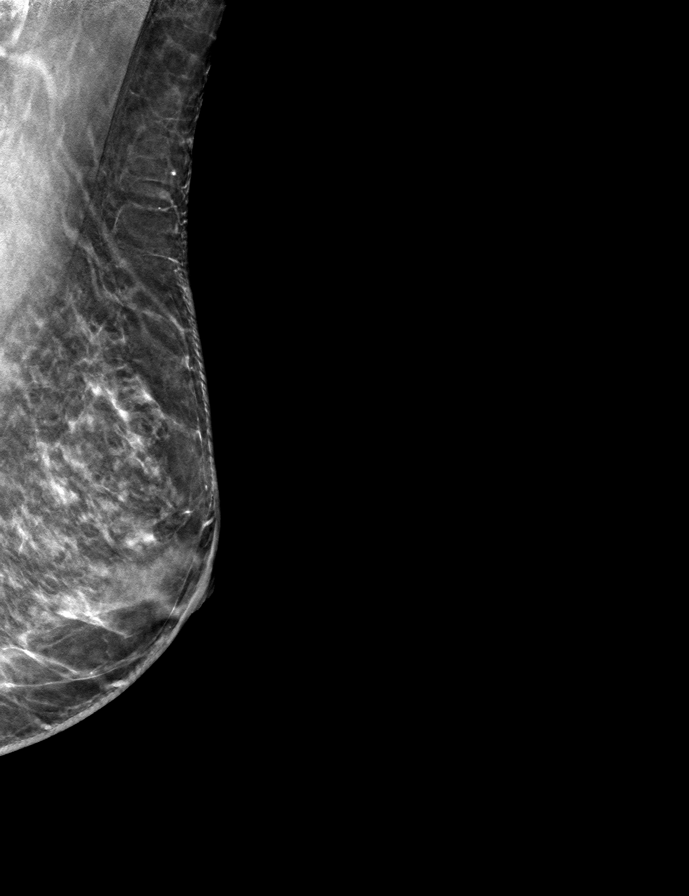

[9 of 24 positions shown; findings below may reference images not displayed]

ACR Breast Density Category c: The breast tissue is heterogeneously
dense, which may obscure small masses.
FINDINGS: There are no findings suspicious for malignancy. Images were
processed with CAD.
IMPRESSION: No mammographic evidence of malignancy. A result letter of this
screening mammogram will be mailed directly to the patient.

RECOMMENDATION:
Screening mammogram in one year. (Code:[5V])

BI-RADS CATEGORY  1: Negative.

## 2019-09-13 ENCOUNTER — Other Ambulatory Visit: Payer: Self-pay

## 2019-09-13 ENCOUNTER — Emergency Department
Admission: EM | Admit: 2019-09-13 | Discharge: 2019-09-14 | Disposition: A | Discharge status: Home / Self Care | Payer: Medicare Other | Attending: Emergency Medicine | Admitting: Emergency Medicine

## 2019-09-13 ENCOUNTER — Encounter: Payer: Self-pay | Admitting: Emergency Medicine

## 2019-09-13 DIAGNOSIS — R197 Diarrhea, unspecified: Secondary | ICD-10-CM

## 2019-09-13 DIAGNOSIS — N189 Chronic kidney disease, unspecified: Secondary | ICD-10-CM | POA: Diagnosis not present

## 2019-09-13 DIAGNOSIS — Z7984 Long term (current) use of oral hypoglycemic drugs: Secondary | ICD-10-CM | POA: Diagnosis not present

## 2019-09-13 DIAGNOSIS — R110 Nausea: Secondary | ICD-10-CM | POA: Insufficient documentation

## 2019-09-13 DIAGNOSIS — Z20828 Contact with and (suspected) exposure to other viral communicable diseases: Secondary | ICD-10-CM | POA: Diagnosis not present

## 2019-09-13 DIAGNOSIS — I129 Hypertensive chronic kidney disease with stage 1 through stage 4 chronic kidney disease, or unspecified chronic kidney disease: Secondary | ICD-10-CM | POA: Insufficient documentation

## 2019-09-13 DIAGNOSIS — N200 Calculus of kidney: Secondary | ICD-10-CM | POA: Diagnosis not present

## 2019-09-13 DIAGNOSIS — Z79899 Other long term (current) drug therapy: Secondary | ICD-10-CM | POA: Diagnosis not present

## 2019-09-13 DIAGNOSIS — E119 Type 2 diabetes mellitus without complications: Secondary | ICD-10-CM | POA: Diagnosis not present

## 2019-09-13 DIAGNOSIS — E860 Dehydration: Secondary | ICD-10-CM | POA: Diagnosis not present

## 2019-09-13 MED ORDER — SODIUM CHLORIDE 0.9 % IV BOLUS
1000.0000 mL | Freq: Once | INTRAVENOUS | Status: AC
  Administered 2019-09-14: 1000 mL via INTRAVENOUS

## 2019-09-13 MED ORDER — SODIUM CHLORIDE 0.9 % IV SOLN
Freq: Once | INTRAVENOUS | Status: AC
  Administered 2019-09-13: 19:00:00 via INTRAVENOUS

## 2019-09-13 MED ORDER — IOHEXOL 9 MG/ML PO SOLN
500.0000 mL | ORAL | Status: AC
  Administered 2019-09-13: 500 mL via ORAL

## 2019-09-13 NOTE — ED Provider Notes (Signed)
[facility] Emergency Department Provider Note   ____________________________________________   First MD Initiated Contact with Patient 09/13/19 2322     (approximate)  I have reviewed the triage vital signs and the nursing notes.   HISTORY  Chief Complaint Diarrhea    HPI Rhonda Day is a 78 y.o. female sent to the ED from PCPs office for IV fluids for renal failure secondary to dehydration secondary to diarrhea x1 week.  Patient denies fever, cough, chest pain, shortness of breath, abdominal pain, vomiting, dysuria.  Has been participating in a research study for new diabetes medicine since October.  Thinks she has been getting the medicine because she has been nauseated although diarrhea only started 1 week ago.  Has been having several diarrheal stools daily.  Seen at PCPs office with lab test done with creatinine 1.9 which is up from her baseline of 1.2.  Sent to the ED for IV fluids and further evaluation.  Denies recent antibiotic use, camping, travel.  No sick contacts.       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 Authorizing Provider    aspirin 120 MG suppository Place 120 mg rectally every 6 (six) hours as needed for fever.    [provider]  azelastine (OPTIVAR) 0.05 % ophthalmic solution  11/30/17   [provider]  estradiol (ESTRACE) 0.5 MG tablet Take 1 tablet (0.5 mg total) by mouth daily. 02/23/17   Gae Dry, MD  fluticasone (FLONASE) 50 MCG/ACT nasal spray SHAKE LQ AND U 1 SPR IEN QD 11/30/17   [provider]  gentamicin cream (GARAMYCIN) 0.1 % Apply 1 application topically 2 (two) times daily. 12/03/17   Edrick Kins, DPM  hydrochlorothiazide (HYDRODIURIL) 25 MG tablet Take 25 mg by mouth daily. 12/16/16   [provider]  levocetirizine (XYZAL) 5 MG tablet TK 1 T PO QD IN THE EVE 11/30/17   [provider]  losartan (COZAAR) 100 MG tablet Take 100 mg by mouth daily.    [provider]  medroxyPROGESTERone (PROVERA) 2.5 MG tablet Take 1 tablet (2.5 mg total) by mouth daily. 02/23/17   Gae Dry, MD  metFORMIN (GLUCOPHAGE-XR) 500 MG 24 hr tablet take 1 tablet by mouth once daily with DINNER 12/16/16   [provider]  montelukast (SINGULAIR) 10 MG tablet TK 1 T PO QD 11/30/17   [provider]  Omega-3 Fatty Acids (FISH OIL PO) Take by mouth.    [provider]  ONE TOUCH ULTRA TEST test strip  09/24/17   [provider]  Jonetta Speak LANCETS [id number] MISC 1 each by XX route as directed. Check CBG's  fasting once daily. Dx: E11.9 05/12/17   [provider]  ULTRACET 37.5-325 MG tablet  11/25/16   [provider]  ULTRAM 50 MG tablet TK 2 TS PO TID PRN 11/24/17   [provider]    Allergies Sulfa antibiotics  Family History  Problem Relation Age of Onset   Kidney cancer Father    Breast cancer Neg Hx     Social History Social History   Tobacco Use   Smoking status: Unknown If Ever Smoked   Smokeless tobacco: Never Used  Substance Use Topics   Alcohol use: No    Alcohol/week: 0.0  standard drinks   Drug use: No    Review of Systems  Constitutional: No fever/chills Eyes: No visual changes. ENT: No sore throat. Cardiovascular: Denies chest pain. Respiratory: Denies shortness of breath. Gastrointestinal: No abdominal pain.  Positive for nausea, no vomiting.  Positive for diarrhea.  No constipation. Genitourinary: Negative for dysuria. Musculoskeletal: Negative for back pain. Skin: Negative for rash. Neurological: Negative for headaches, focal weakness or numbness.   ____________________________________________   PHYSICAL EXAM:  VITAL SIGNS: ED Triage Vitals  Enc Vitals Group     BP 09/13/19 1830 113/62     Pulse Rate 09/13/19 1830 (!) 108     Resp 09/13/19 1830 16     Temp 09/13/19 1830 97.6 F (36.4 C)     Temp Source 09/13/19 1830 Oral     SpO2 09/13/19 1830 97 %     Weight --      Height --      Head Circumference --      Peak Flow --      Pain Score 09/13/19 1831 0     Pain Loc --      Pain Edu? --      Excl. in [place]? --     Constitutional: Alert and oriented. Well appearing and in no acute distress. Eyes: Conjunctivae are normal. PERRL. EOMI. Head: Atraumatic. Nose: No congestion/rhinnorhea. Mouth/Throat: Mucous membranes are moist.  Oropharynx non-erythematous. Neck: No stridor.   Cardiovascular: Normal rate, regular rhythm. Grossly normal heart sounds.  Good peripheral circulation. Respiratory: Normal respiratory effort.  No retractions. Lungs CTAB. Gastrointestinal: Soft and nontender to light or deep palpation. No distention. No abdominal bruits. No CVA tenderness. Musculoskeletal: No lower extremity tenderness nor edema.  No joint effusions. Neurologic:  Normal speech and language. No gross focal neurologic deficits are appreciated. No gait instability. Skin:  Skin is warm, dry and intact. No rash noted. Psychiatric: Mood and affect are normal. Speech and behavior are normal.  ____________________________________________    LABS (all labs ordered are listed, but only abnormal results are displayed)  Labs Reviewed  BASIC METABOLIC PANEL - Abnormal; Notable for the following components:      Result Value   Potassium 3.3 (*)    Glucose, Bld 104 (*)    BUN 39 (*)    Creatinine, Ser 1.83 (*)    GFR calc non Af Amer 26 (*)    GFR calc Af Amer 31 (*)    All other components within normal limits  C DIFFICILE QUICK SCREEN W PCR REFLEX  GASTROINTESTINAL PANEL BY PCR, STOOL (REPLACES STOOL CULTURE)  SARS CORONAVIRUS 2 (TAT 6-24 HRS)   ____________________________________________  EKG  None ____________________________________________  RADIOLOGY  ED MD interpretation: Colonic fluid consistent with diarrheal state  Official radiology report(s): Ct Abdomen Pelvis Wo Contrast  Result Date: 09/14/2019 CLINICAL DATA:  Dehydration and renal failure, diarrhea EXAM: CT  ABDOMEN AND PELVIS WITHOUT CONTRAST TECHNIQUE: Multidetector CT imaging of the abdomen and pelvis was performed following the standard protocol without IV contrast. COMPARISON:  None. FINDINGS: Lower chest: No acute abnormality. Hepatobiliary: Fatty infiltration of the liver is noted. The gallbladder is not well visualized. Pancreas: Fatty interdigitation of the pancreas is noted. No focal mass is noted. Spleen: Normal in size without focal abnormality. Adrenals/Urinary Tract: Adrenal glands are within normal limits. Kidneys demonstrate tiny nonobstructing stone in the lower pole of the right kidney. No obstructive changes are noted. The bladder is well distended. Stomach/Bowel: The appendix is within normal limits. Fluid is noted throughout the colon consistent with the history of diarrhea. No obstructive changes are noted. The stomach and small bowel are within normal limits. Vascular/Lymphatic: No significant vascular findings are present. No enlarged abdominal or pelvic lymph nodes. Reproductive: Uterus and bilateral adnexa are unremarkable. Changes  of tubal ligation are noted. Other: No abdominal wall hernia or abnormality. No abdominopelvic ascites. Musculoskeletal: Degenerative changes of the lumbar spine are noted. No acute abnormality is noted. IMPRESSION: Fatty infiltration of the liver and pancreas. Nonobstructing right lower pole renal stone. Fluid throughout the colon consistent with the diarrheal state. No other focal abnormality is noted. Electronically Signed   By: Inez Catalina M.D.   On: 09/14/2019 02:09    ____________________________________________   PROCEDURES  Procedure(s) performed (including Critical Care):  Procedures   ____________________________________________   INITIAL IMPRESSION / ASSESSMENT AND PLAN / ED COURSE  As part of my medical decision making, I reviewed the following data within the [place] notes reviewed and incorporated, Labs reviewed, Old chart reviewed, Radiograph reviewed and Notes from prior ED visits     Chayil Geno Babiarz was evaluated in Emergency Department on 09/14/2019 for the symptoms described in the history of present illness. She was evaluated in the context of the global COVID-19 pandemic, which necessitated consideration that the patient might be at risk for infection with the SARS-CoV-2 virus that causes COVID-19. Institutional protocols and algorithms that pertain to the evaluation of patients at risk for COVID-19 are in a state of rapid change based on information released by regulatory bodies including the CDC and federal and state organizations. These policies and algorithms were followed during the patient's care in the ED.    78 year old female who presents for diarrhea x1 week, AKI and dehydration.  Differential diagnosis includes but is not limited to gastroenteritis, colitis, diverticulitis, research medication induced symptoms, etc.  I personally reviewed patient's laboratory data from her office visit this afternoon.  Plan to repeat BMP after 2  full L of IV fluids.  Patient was not able to provide stool specimen at the office this afternoon.  Will provide patient something to eat in the hopes that she may provide a stool specimen.  Will obtain CT scan to evaluate intra-abdominal etiology of patient's symptoms.   Clinical Course as of Sep 13 304  Thu Sep 14, 2019  0239 Updated patient on C Diff and CT results. Awaiting repeat BMP.   [JS]  0302 Creatinine improving.  Patient resting comfortably without complaints.  Will discharge home with follow-up with her PCP.  Strict return precautions given.  Patient verbalizes understanding and agrees with plan of care.   [JS]    Clinical Course User Index [JS] Paulette Blanch, MD     ____________________________________________   FINAL CLINICAL IMPRESSION(S) / ED DIAGNOSES  Final diagnoses:  Diarrhea, unspecified type  Dehydration  ED Discharge Orders    None       Note:  This document was prepared using Dragon voice recognition software and may include unintentional dictation errors.   Paulette Blanch, MD 09/14/19 [phone]

## 2019-09-13 NOTE — ED Notes (Signed)
Spoke with MD Archie Balboa about pt, see orders. Pt had blood work today, see in care everywhere. Orders for IVF only at this time

## 2019-09-13 NOTE — ED Triage Notes (Signed)
PT sent from Dr. Terrence Dupont for renal failure from dehydration . PT states she has had diarrhea x1wk. PT is A&OX4,VS

## 2019-09-13 NOTE — ED Notes (Signed)
Pt resting on stretcher alert and calm. Denies pain at this time and pt reports she is starting to feel hungry. IV fluids infusing without difficulty and rate changed per MD request. Pt informed of plan of care. Verbalized understanding. will continue to assess.

## 2019-09-14 ENCOUNTER — Emergency Department: Payer: Medicare Other

## 2019-09-14 LAB — BASIC METABOLIC PANEL
Anion gap: 10 (ref 5–15)
BUN: 39 mg/dL — ABNORMAL HIGH (ref 8–23)
CO2: 26 mmol/L (ref 22–32)
Calcium: 9 mg/dL (ref 8.9–10.3)
Chloride: 101 mmol/L (ref 98–111)
Creatinine, Ser: 1.83 mg/dL — ABNORMAL HIGH (ref 0.44–1.00)
GFR calc Af Amer: 31 mL/min — ABNORMAL LOW (ref >60)
GFR calc non Af Amer: 26 mL/min — ABNORMAL LOW (ref >60)
Glucose, Bld: 104 mg/dL — ABNORMAL HIGH (ref 70–99)
Potassium: 3.3 mmol/L — ABNORMAL LOW (ref 3.5–5.1)
Sodium: 137 mmol/L (ref 135–145)

## 2019-09-14 LAB — GASTROINTESTINAL PANEL BY PCR, STOOL (REPLACES STOOL CULTURE)

## 2019-09-14 LAB — SARS CORONAVIRUS 2 (TAT 6-24 HRS): SARS Coronavirus 2: NEGATIVE

## 2019-09-14 LAB — C DIFFICILE QUICK SCREEN W PCR REFLEX: C Diff toxin: NEGATIVE

## 2019-09-14 LAB — C DIFFICILE QUICK SCREEN W PCR REFLEX??
C Diff antigen: NEGATIVE
C Diff interpretation: NOT DETECTED

## 2019-09-14 IMAGING — CT CT ABD-PELV W/O CM
2 of 4 series · 16 of 46 positions shown, 18 images · non-contrast
Comparison: None.

CLINICAL DATA: Dehydration and renal failure, diarrhea

EXAM:
CT ABDOMEN AND PELVIS WITHOUT CONTRAST
TECHNIQUE: Multidetector CT imaging of the abdomen and pelvis was performed
following the standard protocol without IV contrast.

[Series 2: routine abd/pel wo · axial · 0.72mm/px · z∈[-1162,-712]mm · 13 of 100 slices shown, 15 images]
[im 5/100  soft-tissue]
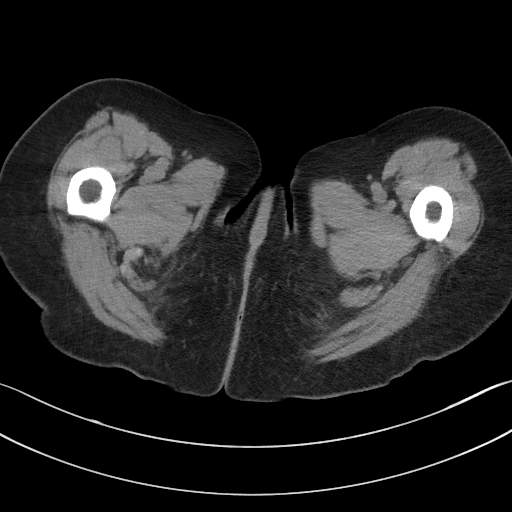
[im 5/100  bone]
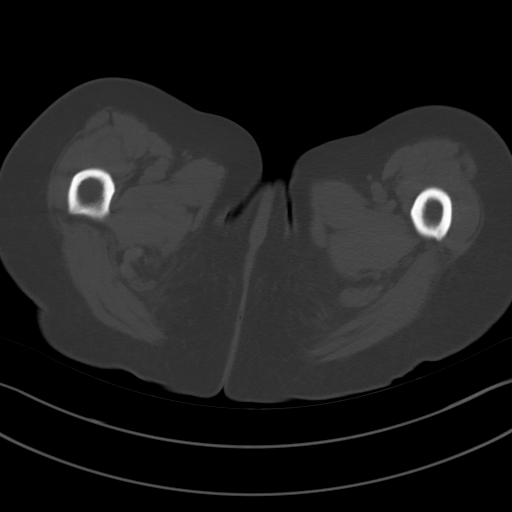
[im 13/100  soft-tissue]
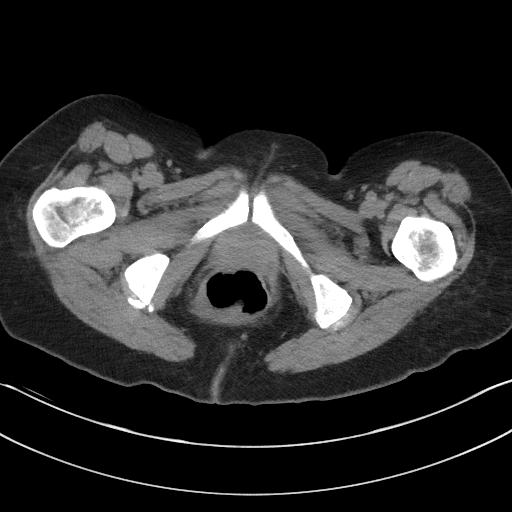
[im 21/100  soft-tissue]
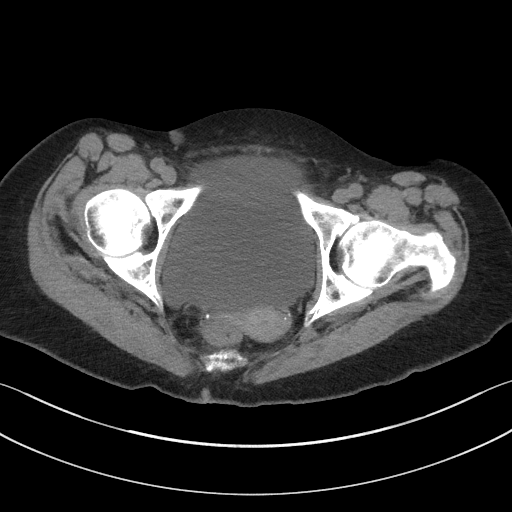
[im 29/100  soft-tissue]
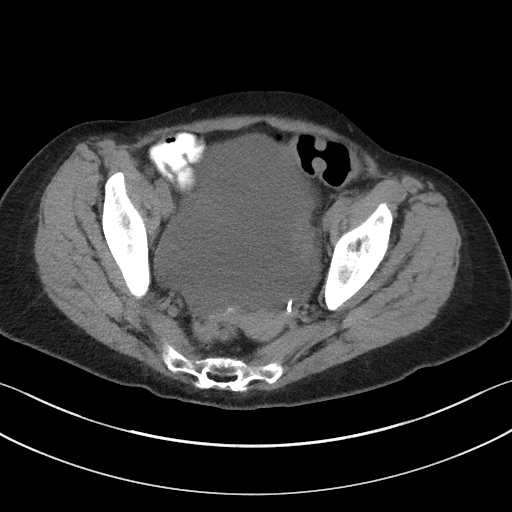
[im 34/100  soft-tissue]
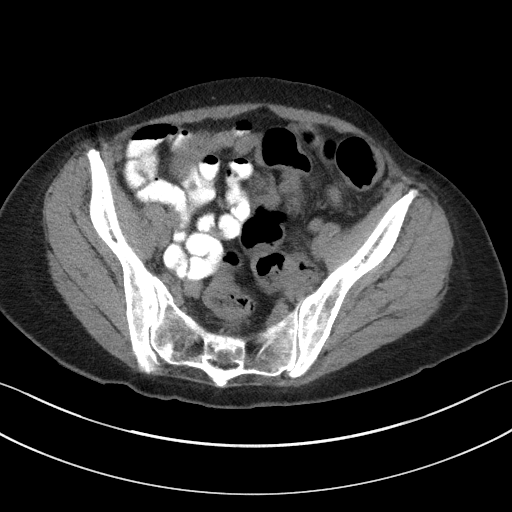
[im 42/100  soft-tissue]
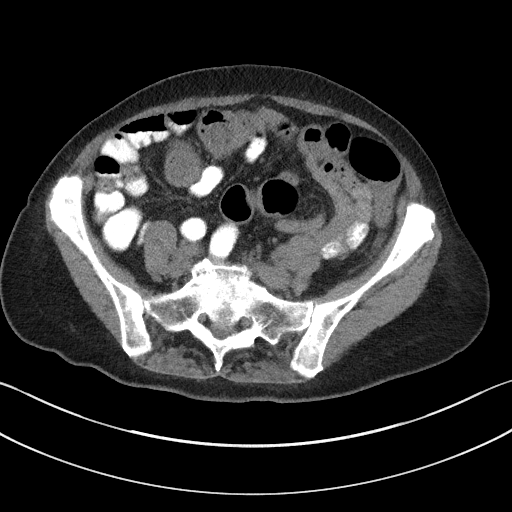
[im 50/100  soft-tissue]
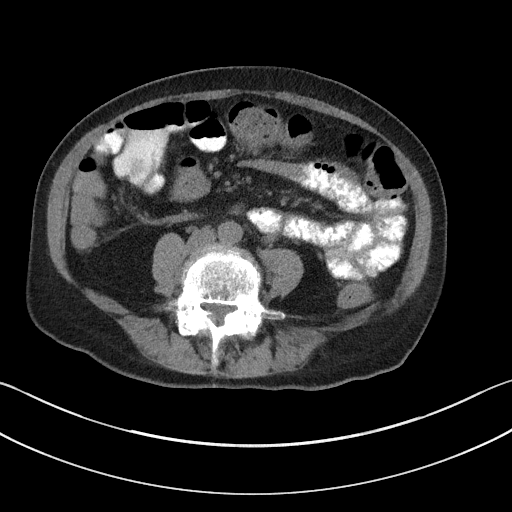
[im 58/100  soft-tissue]
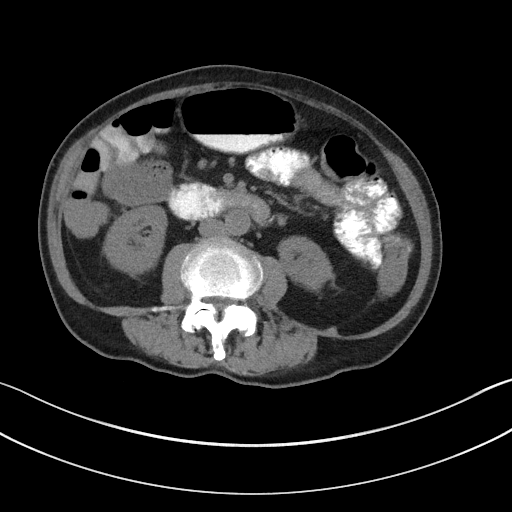
[im 67/100  soft-tissue]
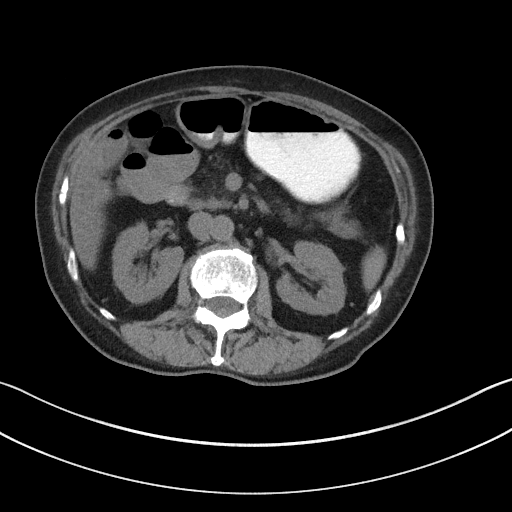
[im 67/100  bone]
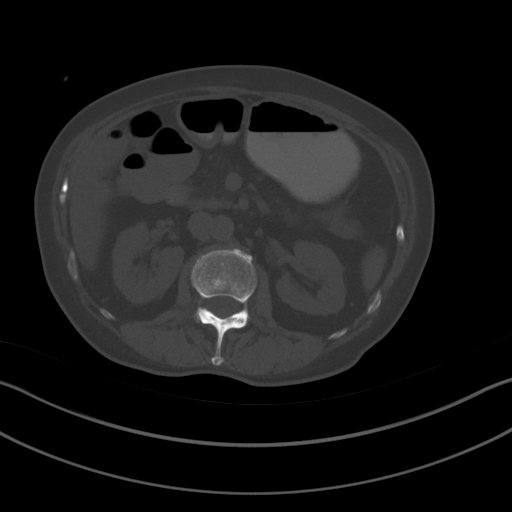
[im 71/100  soft-tissue]
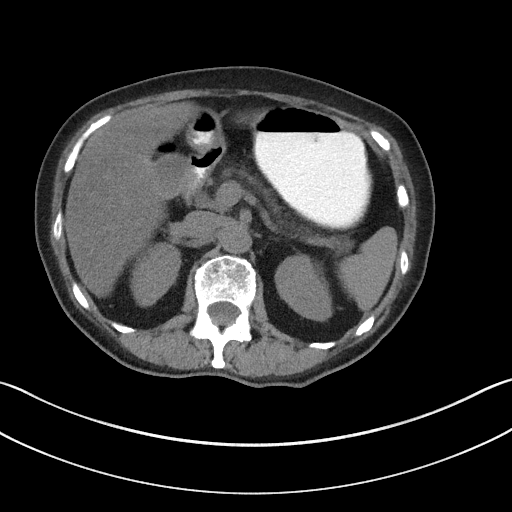
[im 79/100  soft-tissue]
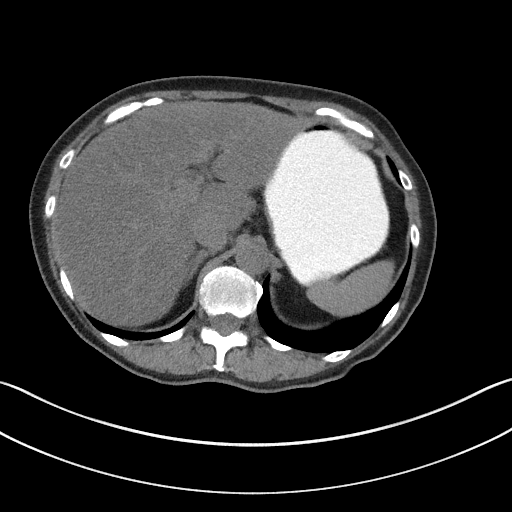
[im 87/100  soft-tissue]
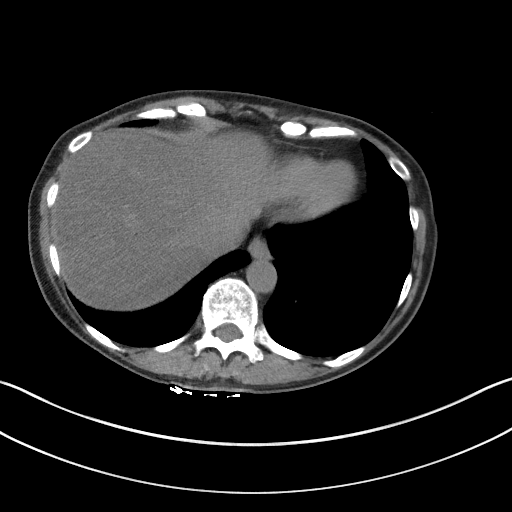
[im 95/100  soft-tissue]
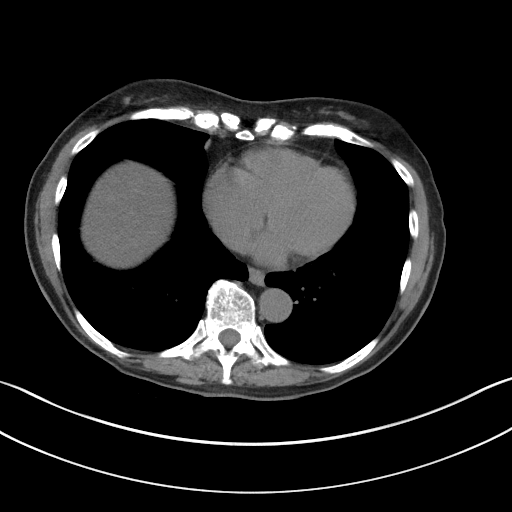

[Series 5: coronal st · coronal · 0.69mm/px · 3 of 74 slices shown]
[im 25/74  soft-tissue]
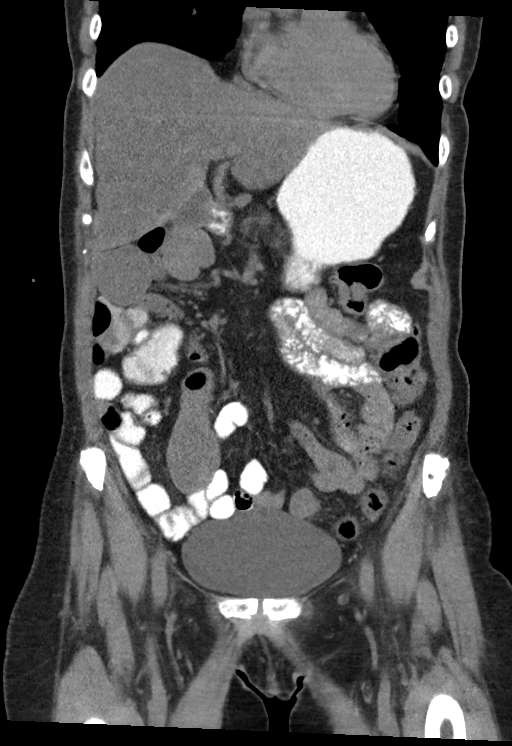
[im 33/74  soft-tissue]
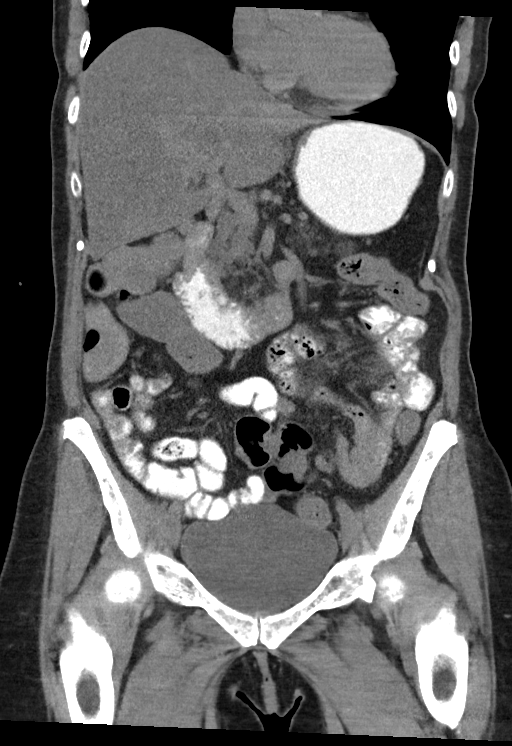
[im 41/74  soft-tissue]
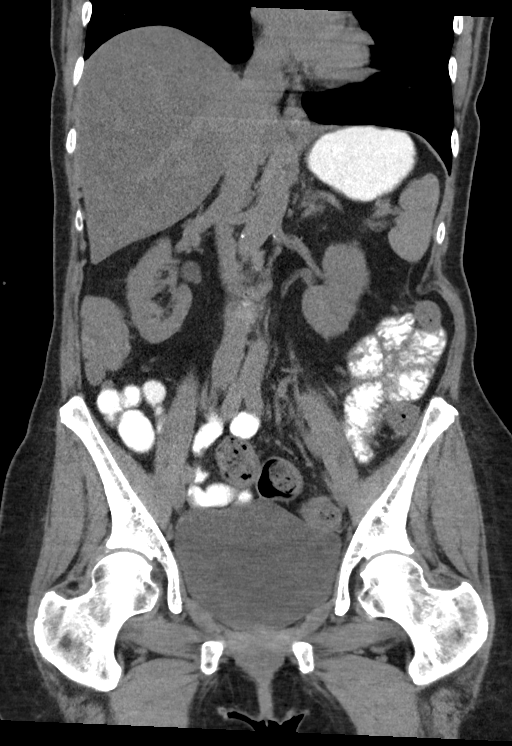

[16 of 46 positions shown; findings below may reference images not displayed]

FINDINGS: Lower chest: No acute abnormality.

Hepatobiliary: Fatty infiltration of the liver is noted. The
gallbladder is not well visualized.

Pancreas: Fatty interdigitation of the pancreas is noted. No focal
mass is noted.

Spleen: Normal in size without focal abnormality.

Adrenals/Urinary Tract: Adrenal glands are within normal limits.
Kidneys demonstrate tiny nonobstructing stone in the lower pole of
the right kidney. No obstructive changes are noted. The bladder is
well distended.

Stomach/Bowel: The appendix is within normal limits. Fluid is noted
throughout the colon consistent with the history of diarrhea. No
obstructive changes are noted. The stomach and small bowel are
within normal limits.

Vascular/Lymphatic: No significant vascular findings are present. No
enlarged abdominal or pelvic lymph nodes.

Reproductive: Uterus and bilateral adnexa are unremarkable. Changes
of tubal ligation are noted.

Other: No abdominal wall hernia or abnormality. No abdominopelvic
ascites.

Musculoskeletal: Degenerative changes of the lumbar spine are noted.
No acute abnormality is noted.
IMPRESSION: Fatty infiltration of the liver and pancreas.

Nonobstructing right lower pole renal stone.

Fluid throughout the colon consistent with the diarrheal state. No
other focal abnormality is noted.

## 2019-09-14 MED ORDER — POTASSIUM CHLORIDE CRYS ER 20 MEQ PO TBCR
40.0000 meq | EXTENDED_RELEASE_TABLET | Freq: Once | ORAL | Status: AC
  Administered 2019-09-14: 40 meq via ORAL
  Filled 2019-09-14: qty 2

## 2019-09-14 NOTE — Discharge Instructions (Addendum)
You may take Imodium as needed for diarrhea.  Drink plenty of fluids daily.  BRAT diet x5 days, then slowly advance diet as tolerated.  Return to the ER for worsening symptoms, persistent vomiting, difficulty breathing or other concerns.

## 2019-09-14 NOTE — ED Notes (Signed)
Pt to CT

## 2019-09-14 NOTE — ED Notes (Signed)
Pt was assisted to the restroom and returned back to bed. Nothing needed from staff at this time

## 2019-09-29 ENCOUNTER — Other Ambulatory Visit: Payer: Self-pay | Admitting: Student

## 2019-09-29 DIAGNOSIS — K8689 Other specified diseases of pancreas: Secondary | ICD-10-CM

## 2019-10-11 ENCOUNTER — Other Ambulatory Visit: Payer: Self-pay | Admitting: Student

## 2019-10-11 ENCOUNTER — Other Ambulatory Visit: Payer: Self-pay

## 2019-10-11 ENCOUNTER — Other Ambulatory Visit: Payer: Self-pay | Admitting: "Endocrinology

## 2019-10-11 ENCOUNTER — Ambulatory Visit
Admission: RE | Admit: 2019-10-11 | Discharge: 2019-10-11 | Disposition: A | Discharge status: Home / Self Care | Payer: Medicare Other | Source: Ambulatory Visit | Attending: Student | Admitting: Student

## 2019-10-11 DIAGNOSIS — K8689 Other specified diseases of pancreas: Secondary | ICD-10-CM

## 2019-10-11 DIAGNOSIS — E0590 Thyrotoxicosis, unspecified without thyrotoxic crisis or storm: Secondary | ICD-10-CM

## 2019-10-11 DIAGNOSIS — E042 Nontoxic multinodular goiter: Secondary | ICD-10-CM

## 2019-10-11 IMAGING — MR MR MRCP
10 of 13 series · 39 of 48 positions shown · non-contrast
Comparison: CT abdomen/pelvis dated [DATE]

CLINICAL DATA: Pancreatic insufficiency

EXAM:
MRI ABDOMEN WITHOUT CONTRAST  (INCLUDING MRCP)
TECHNIQUE: Multiplanar multisequence MR imaging of the abdomen was performed.
Heavily T2-weighted images of the biliary and pancreatic ducts were
obtained, and three-dimensional MRCP images were rendered by post
processing.

[Series 3: T2 · coronal · 6.0mm · 1.19mm/px · 4 of 30 slices shown (1 of 2)]
[im 1/30]
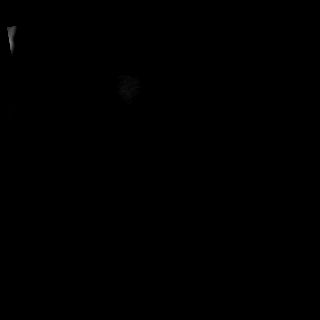
[im 10/30]
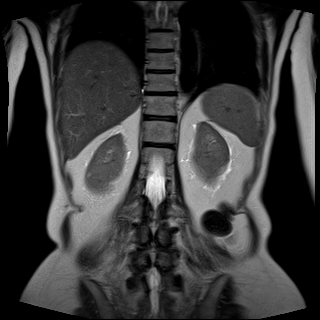
[im 20/30]
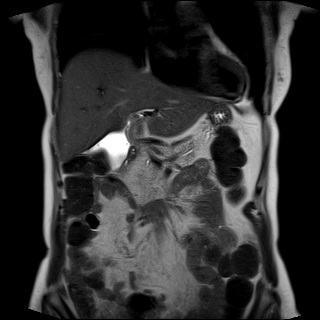
[im 30/30]
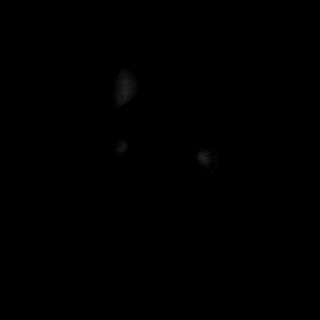

[Series 4: T2 · axial · 6.5mm · 1.19mm/px · z∈[-33,+209]mm · 3 of 32 slices shown (2 of 2)]
[im 1/32]
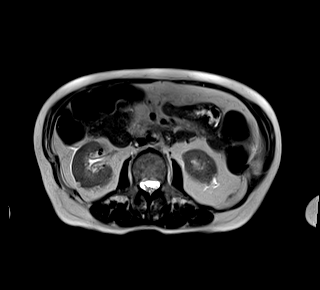
[im 16/32]
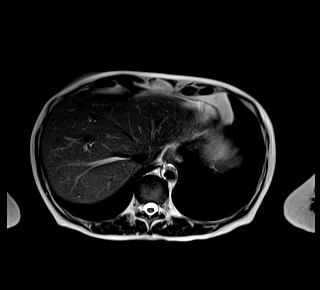
[im 32/32]
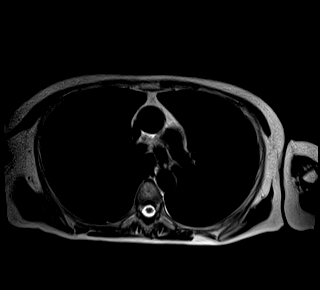

[Series 5: T1 · axial · 6.5mm · 0.74mm/px · z∈[-43,+198]mm · 3 of 32 slices shown (1 of 2)]
[im 1/32]
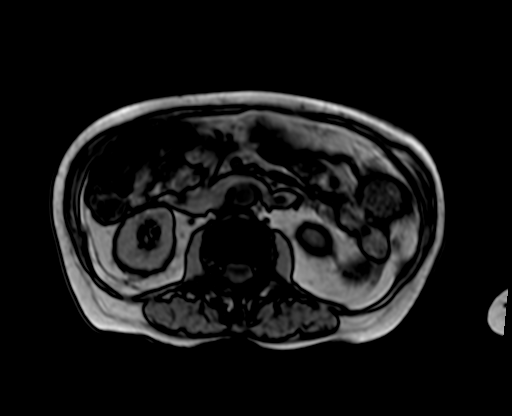
[im 16/32]
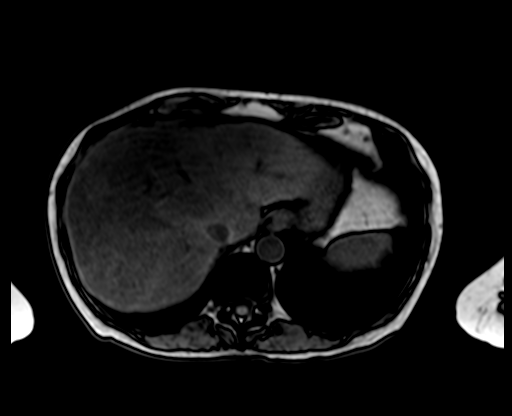
[im 32/32]
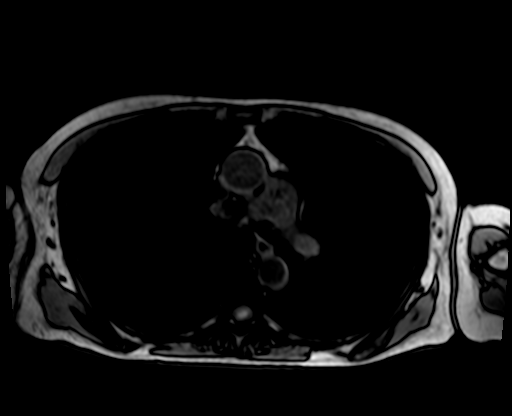

[Series 5: T1 · axial · 6.5mm · 0.74mm/px · z∈[-43,+198]mm · 3 of 32 slices shown (2 of 2)]
[im 1/32]
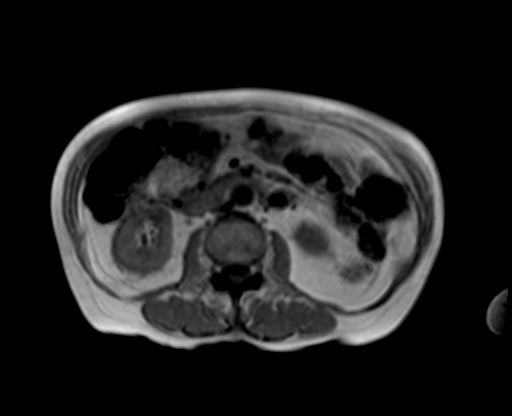
[im 16/32]
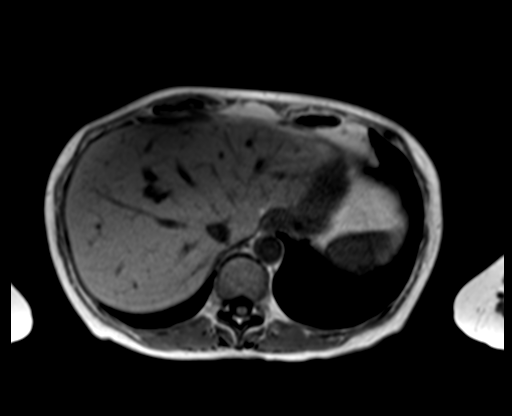
[im 32/32]
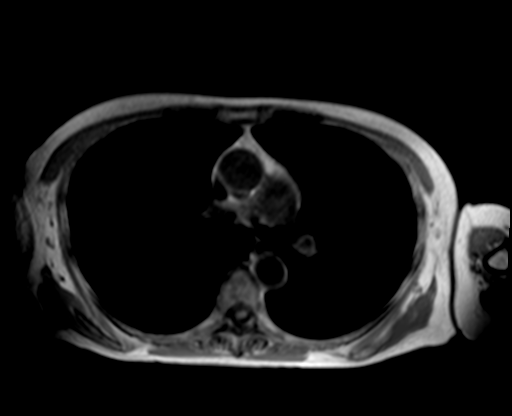

[Series 7: T2 fat-sat · axial · 6.5mm · 1.19mm/px · z∈[-47,+202]mm · 3 of 33 slices shown]
[im 1/33]
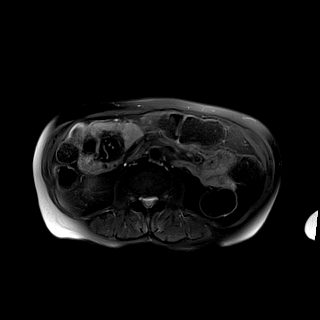
[im 17/33]
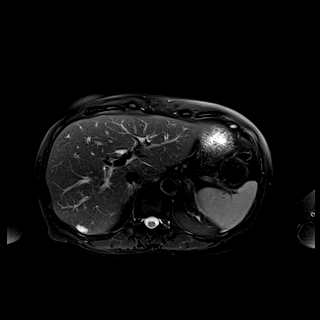
[im 33/33]
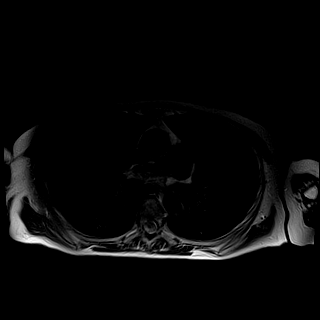

[Series 9: ax dwi_tracew · axial · 6.5mm · 1.42mm/px · z∈[-51,+206]mm · 10 of 102 slices shown]
[im 1/102]
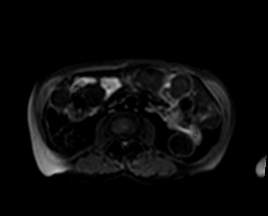
[im 12/102]
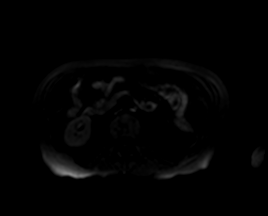
[im 23/102]
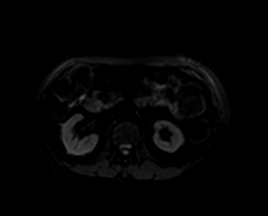
[im 34/102]
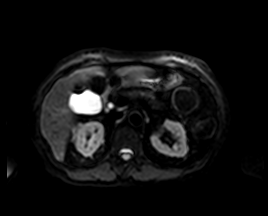
[im 45/102]
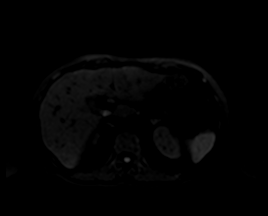
[im 57/102]
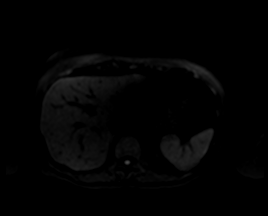
[im 68/102]
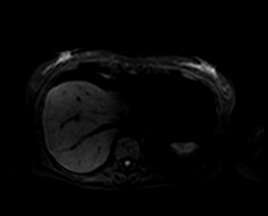
[im 79/102]
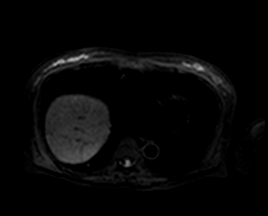
[im 90/102]
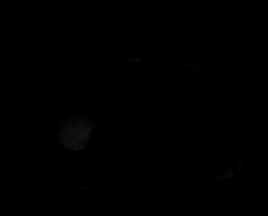
[im 102/102]
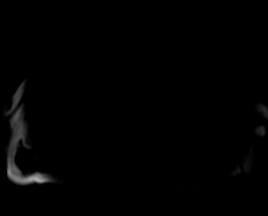

[Series 10: ax dwi_adc · axial · 6.5mm · 1.42mm/px · z∈[-51,+206]mm · 3 of 34 slices shown]
[im 1/34]
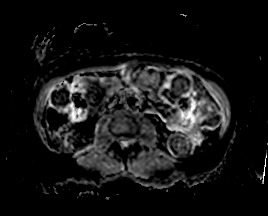
[im 17/34]
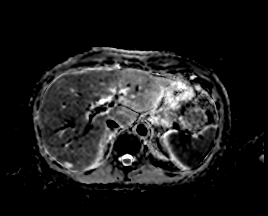
[im 34/34]
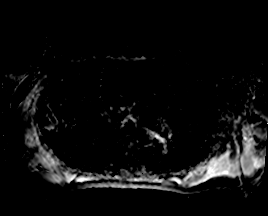

[Series 14: MRCP · coronal · 3.5mm · 1.12mm/px · 2 of 17 slices shown]
[im 1/17]
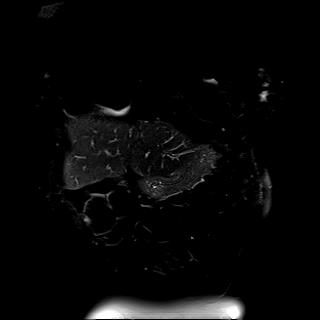
[im 17/17]
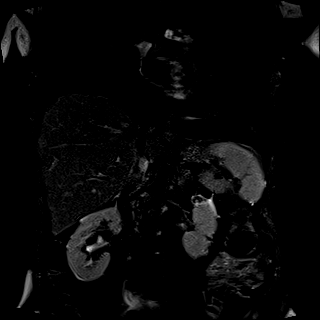

[Series 15: radials · oblique · 50.0mm · 0.78mm/px · 1 of 5 slices shown]
[im 1/5]
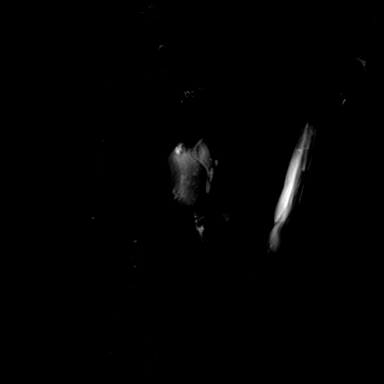

[Series 16: T1 dynamic fat-sat · axial · non-contrast · 3.5mm · 1.19mm/px · z∈[-36,+212]mm · 7 of 72 slices shown]
[im 1/72]
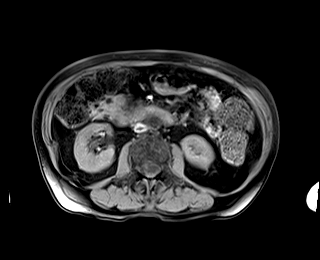
[im 12/72]
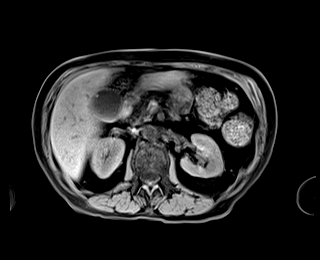
[im 24/72]
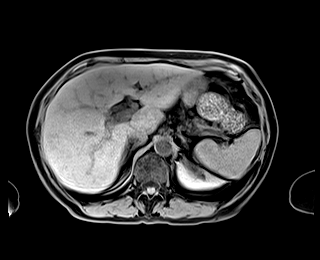
[im 36/72]
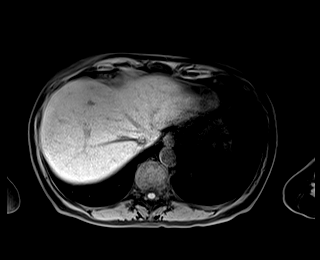
[im 48/72]
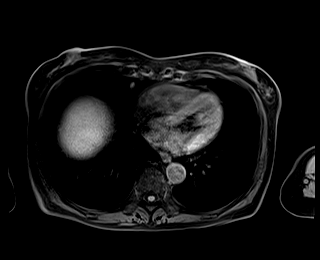
[im 60/72]
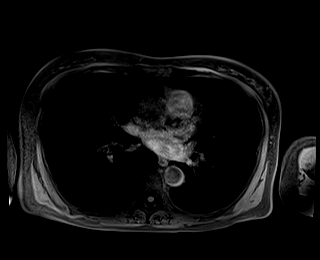
[im 72/72]
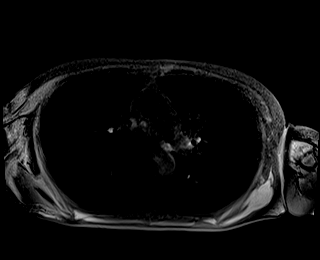

[39 of 48 positions shown; findings below may reference images not displayed]

FINDINGS: Lower chest: Lung bases are clear.

Hepatobiliary: 14 mm cyst in the posterior right hepatic lobe
(series 7/image 17). Moderate opacity a ptosis of focal fatty
sparing along the gallbladder fossa.

Gallbladder is unremarkable. No intrahepatic or extrahepatic ductal
dilatation.

Pancreas: Fatty parenchymal atrophy, particularly involving the
pancreatic head/uncinate process. No pancreatic mass or ductal
dilatation.

Spleen:  Within normal limits.

Adrenals/Urinary Tract:  Adrenal glands are within normal limits.

Kidneys are within normal limits.  No hydronephrosis.

Stomach/Bowel: Stomach is within normal limits.

Visualized bowel is unremarkable.

Vascular/Lymphatic:  No evidence of abdominal aortic aneurysm.

No suspicious abdominal lymphadenopathy.

Other:  No abdominal ascites.

Musculoskeletal: No focal osseous lesions.
IMPRESSION: Fatty parenchymal atrophy of the pancreas. No pancreatic mass or
ductal dilatation.

Moderate hepatic steatosis with focal fatty sparing.

Additional ancillary findings as above.

## 2019-10-31 ENCOUNTER — Encounter
Admission: RE | Admit: 2019-10-31 | Discharge: 2019-10-31 | Disposition: A | Discharge status: Home / Self Care | Payer: Medicare PPO | Source: Ambulatory Visit | Attending: "Endocrinology | Admitting: "Endocrinology

## 2019-10-31 ENCOUNTER — Other Ambulatory Visit: Payer: Self-pay

## 2019-10-31 DIAGNOSIS — E042 Nontoxic multinodular goiter: Secondary | ICD-10-CM

## 2019-10-31 DIAGNOSIS — E0590 Thyrotoxicosis, unspecified without thyrotoxic crisis or storm: Secondary | ICD-10-CM | POA: Insufficient documentation

## 2019-10-31 IMAGING — NM NM THYROID IMAGING W/ UPTAKE MULTI (4&24 HR)
1 series · 4 of 4 positions shown · non-contrast
Comparison: None

CLINICAL DATA: Multinodular goiter, hyperthyroidism, diarrhea,
increased nervousness, heat sensitivity, 10-15 pound weight loss

EXAM:
THYROID SCAN AND UPTAKE - 4 AND 24 HOURS
TECHNIQUE: Following oral administration of [O6] capsule, anterior planar
imaging was acquired at 24 hours. Thyroid uptake was calculated with
a thyroid probe at 4-6 hours and 24 hours.
RADIOPHARMACEUTICALS:  404 uCi [O6] sodium iodide p.o.

[Series 1000: (id) thyroid scan · 2.40mm/px · 4 of 4 slices shown]
[im 1/4  full-range]
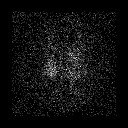
[im 2/4  full-range]
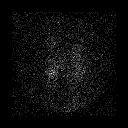
[im 3/4  full-range]
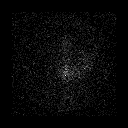
[im 4/4  full-range]
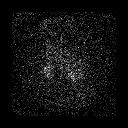

[4 of 4 positions shown; findings below may reference images not displayed]

FINDINGS: Inhomogeneous tracer accumulation throughout both thyroid lobes with
areas of increased or decreased tracer localization consistent with
a multinodular thyroid gland.

4 hour [O6] uptake = 1.4% (normal 5-20%)

24 hour [O6] uptake = 2.5% (normal 10-30%)
IMPRESSION: Decreased 4 hour and 24 hour radio iodine uptakes.

Multinodular thyroid gland.

## 2019-10-31 MED ORDER — SODIUM IODIDE I-123 7.4 MBQ CAPS
404.0600 | ORAL_CAPSULE | Freq: Once | ORAL | Status: AC
  Administered 2019-10-31: 404.06 via ORAL

## 2019-11-01 ENCOUNTER — Encounter
Admission: RE | Admit: 2019-11-01 | Discharge: 2019-11-01 | Disposition: A | Discharge status: Home / Self Care | Payer: Medicare PPO | Source: Ambulatory Visit | Attending: "Endocrinology | Admitting: "Endocrinology

## 2020-01-31 DIAGNOSIS — D638 Anemia in other chronic diseases classified elsewhere: Secondary | ICD-10-CM | POA: Insufficient documentation

## 2020-01-31 HISTORY — DX: Anemia in other chronic diseases classified elsewhere: D63.8

## 2020-02-16 ENCOUNTER — Other Ambulatory Visit: Payer: Self-pay

## 2020-02-16 ENCOUNTER — Ambulatory Visit (LOCAL_COMMUNITY_HEALTH_CENTER): Payer: Medicare PPO

## 2020-02-16 DIAGNOSIS — Z23 Encounter for immunization: Secondary | ICD-10-CM

## 2020-02-16 NOTE — Progress Notes (Signed)
Per client, had tetanus vaccine n 2014 but unsure if was Tdap. Plans to verify which tetanus containing vaccine she had with her PCP. Client states she has had her Covid vaccines. Rich Number, RN

## 2020-03-19 ENCOUNTER — Other Ambulatory Visit: Payer: Self-pay

## 2020-03-19 ENCOUNTER — Ambulatory Visit (LOCAL_COMMUNITY_HEALTH_CENTER): Payer: Medicare PPO

## 2020-03-19 DIAGNOSIS — Z23 Encounter for immunization: Secondary | ICD-10-CM

## 2020-03-19 NOTE — Progress Notes (Signed)
Here for Hep B #2. [facility] copy given. Josie Saunders, RN

## 2020-03-23 DIAGNOSIS — N1832 Chronic kidney disease, stage 3b: Secondary | ICD-10-CM

## 2020-03-23 DIAGNOSIS — E1122 Type 2 diabetes mellitus with diabetic chronic kidney disease: Secondary | ICD-10-CM

## 2020-03-23 HISTORY — DX: Type 2 diabetes mellitus with diabetic chronic kidney disease: E11.22

## 2020-03-23 HISTORY — DX: Chronic kidney disease, stage 3b: N18.32

## 2020-06-04 ENCOUNTER — Other Ambulatory Visit: Payer: Self-pay | Admitting: Family Medicine

## 2020-06-04 DIAGNOSIS — Z1231 Encounter for screening mammogram for malignant neoplasm of breast: Secondary | ICD-10-CM

## 2020-06-26 ENCOUNTER — Other Ambulatory Visit: Payer: Self-pay | Admitting: Orthopedic Surgery

## 2020-06-26 DIAGNOSIS — M7551 Bursitis of right shoulder: Secondary | ICD-10-CM

## 2020-06-26 DIAGNOSIS — G8929 Other chronic pain: Secondary | ICD-10-CM

## 2020-06-26 DIAGNOSIS — M25311 Other instability, right shoulder: Secondary | ICD-10-CM

## 2020-07-02 ENCOUNTER — Ambulatory Visit
Admission: RE | Admit: 2020-07-02 | Discharge: 2020-07-02 | Disposition: A | Discharge status: Home / Self Care | Payer: Medicare PPO | Source: Ambulatory Visit | Attending: Family Medicine | Admitting: Family Medicine

## 2020-07-02 ENCOUNTER — Other Ambulatory Visit: Payer: Self-pay

## 2020-07-02 DIAGNOSIS — Z1231 Encounter for screening mammogram for malignant neoplasm of breast: Secondary | ICD-10-CM | POA: Insufficient documentation

## 2020-07-02 IMAGING — MG DIGITAL SCREENING BILAT W/ TOMO W/ CAD
6 of 10 series · 6 of 30 positions shown · non-contrast
Comparison: Previous exam(s).

CLINICAL DATA: Screening.

EXAM:
DIGITAL SCREENING BILATERAL MAMMOGRAM WITH TOMO AND CAD

[R CC synth-2D]
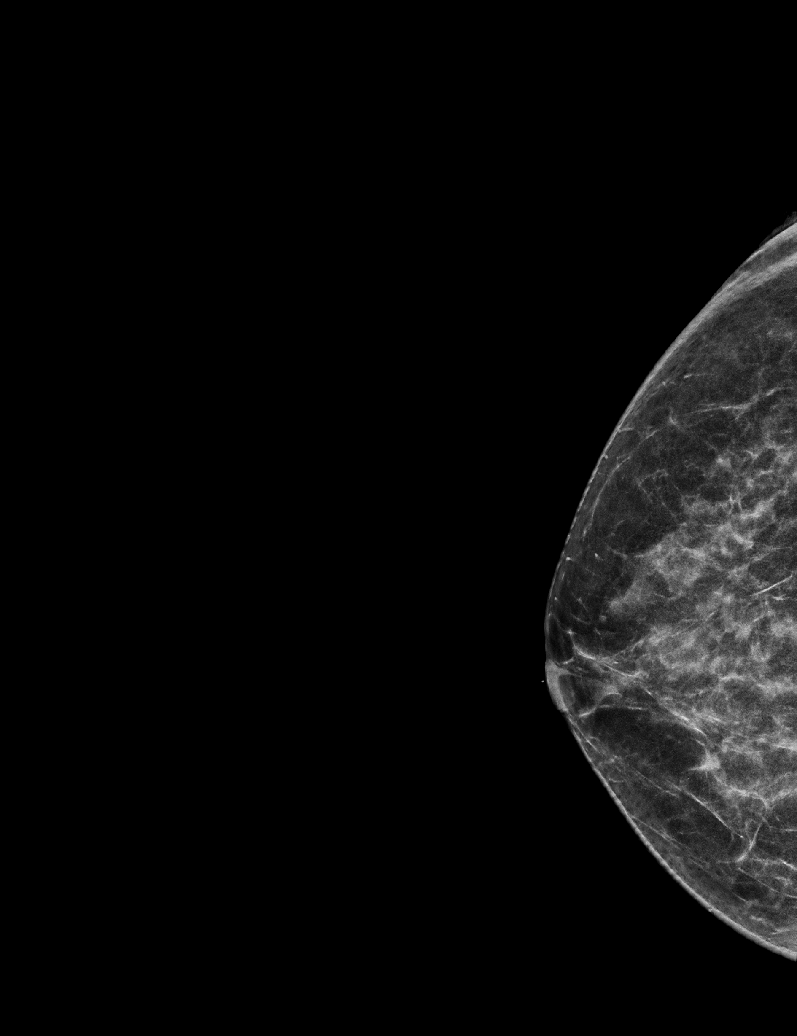

[L MLO synth-2D (1 of 2)]
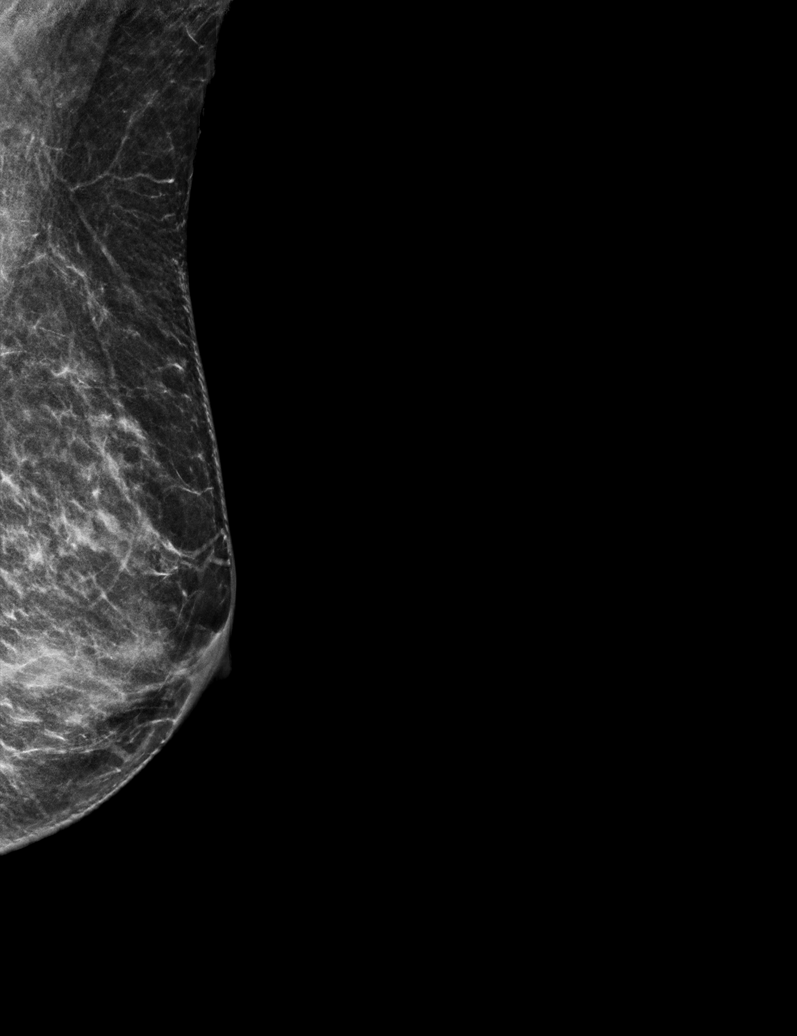

[L CC synth-2D]
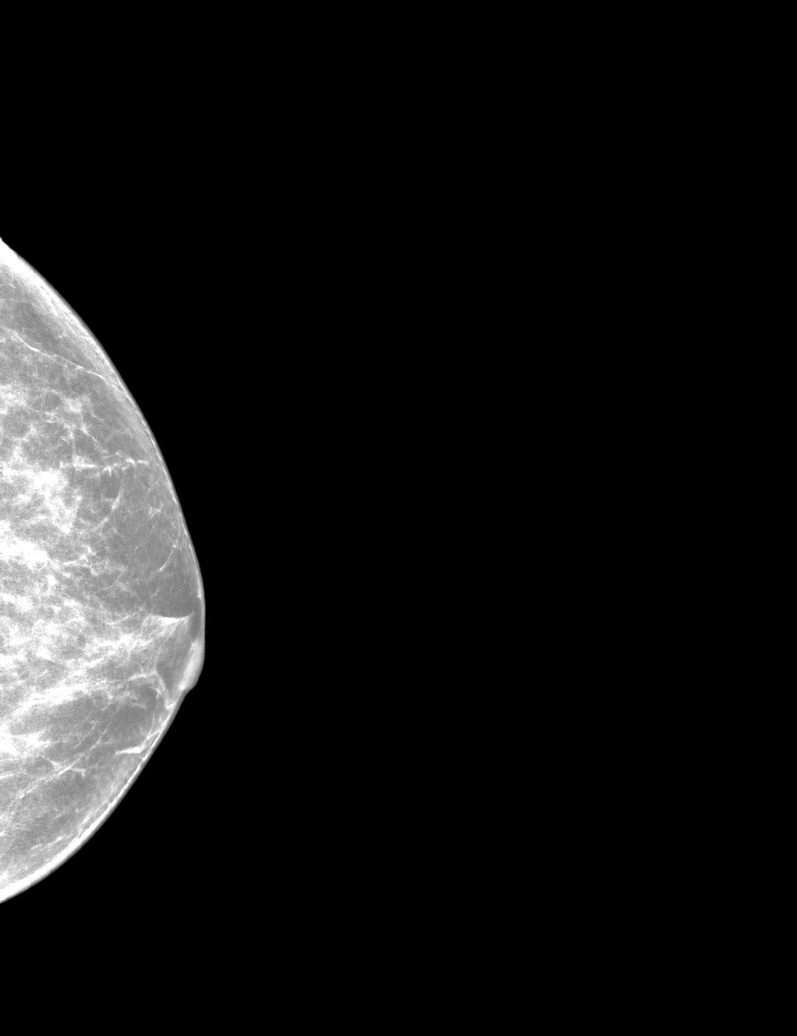

[L MLO synth-2D (2 of 2)]
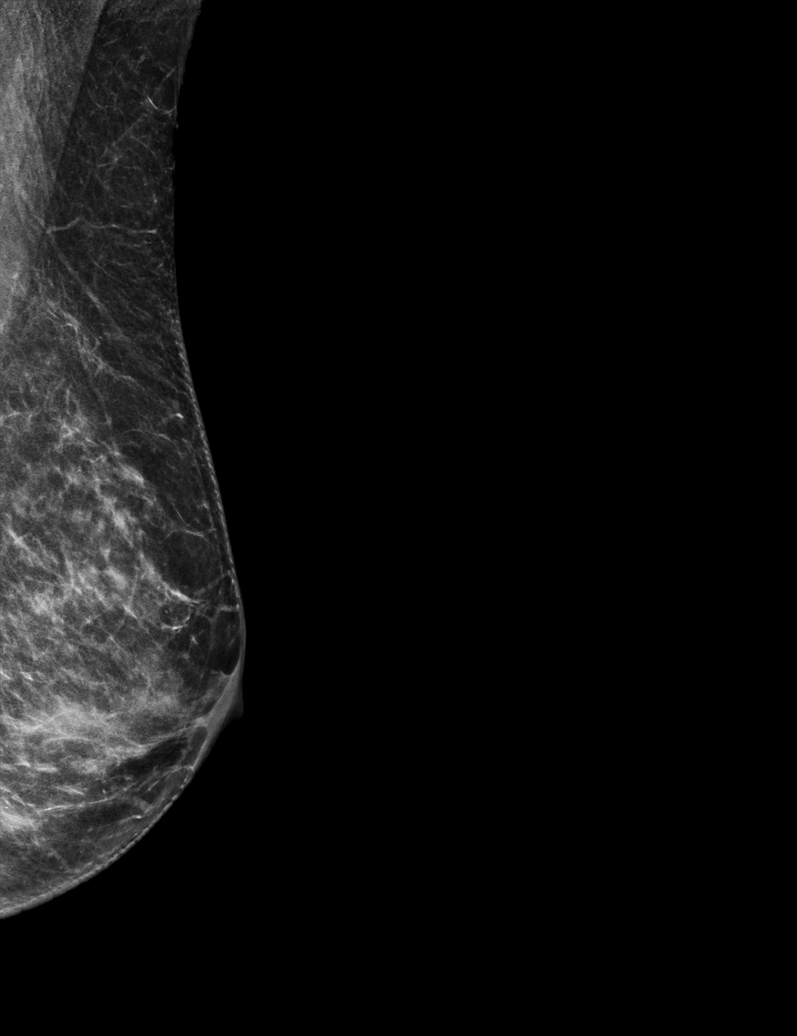

[R MLO synth-2D]
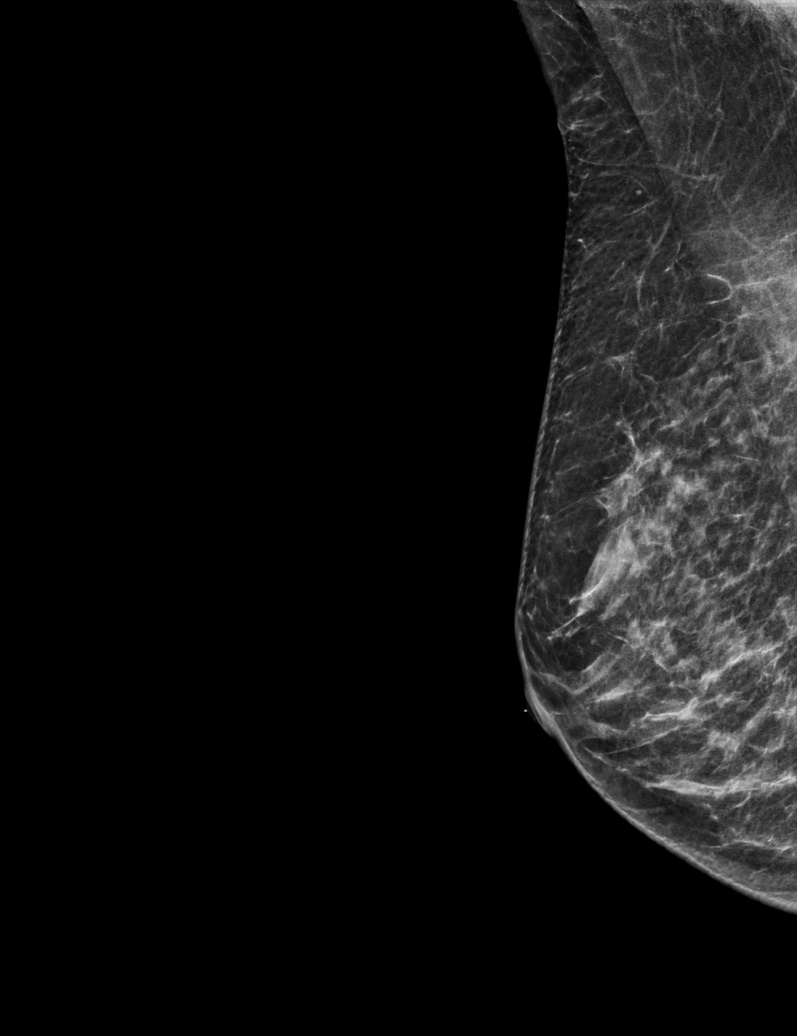

[L MLO tomo · tomo slice 25/50.0]
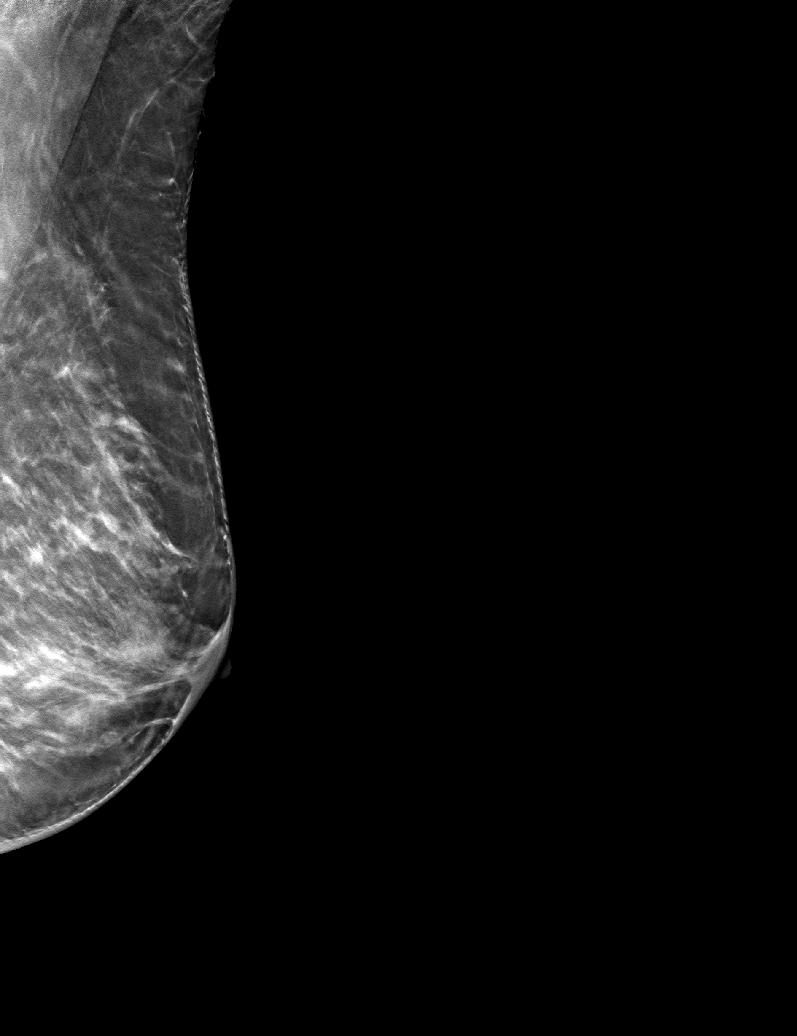

[6 of 30 positions shown; findings below may reference images not displayed]

ACR Breast Density Category c: The breast tissue is heterogeneously
dense, which may obscure small masses.
FINDINGS: There are no findings suspicious for malignancy. Images were
processed with CAD.
IMPRESSION: No mammographic evidence of malignancy. A result letter of this
screening mammogram will be mailed directly to the patient.

RECOMMENDATION:
Screening mammogram in one year. (Code:[5V])

BI-RADS CATEGORY  1: Negative.

## 2020-07-16 ENCOUNTER — Ambulatory Visit
Admission: RE | Admit: 2020-07-16 | Discharge: 2020-07-16 | Disposition: A | Discharge status: Home / Self Care | Payer: Medicare PPO | Source: Ambulatory Visit | Attending: Orthopedic Surgery | Admitting: Orthopedic Surgery

## 2020-07-16 ENCOUNTER — Other Ambulatory Visit: Payer: Self-pay

## 2020-07-16 DIAGNOSIS — G8929 Other chronic pain: Secondary | ICD-10-CM | POA: Diagnosis present

## 2020-07-16 DIAGNOSIS — M25511 Pain in right shoulder: Secondary | ICD-10-CM | POA: Diagnosis present

## 2020-07-16 DIAGNOSIS — M25311 Other instability, right shoulder: Secondary | ICD-10-CM | POA: Insufficient documentation

## 2020-07-16 DIAGNOSIS — M7551 Bursitis of right shoulder: Secondary | ICD-10-CM | POA: Diagnosis present

## 2020-07-16 IMAGING — MR MR SHOULDER*R* W/O CM
5 series · 33 of 40 positions shown · non-contrast
Comparison: None.

CLINICAL DATA: Right shoulder pain and limited range of motion for
approximately 2 months.

EXAM:
MRI OF THE RIGHT SHOULDER WITHOUT CONTRAST
TECHNIQUE: Multiplanar, multisequence MR imaging of the shoulder was performed.
No intravenous contrast was administered.

[Series 6: PD fat-sat · axial · right · 4.0mm · 0.55mm/px · z∈[-100,+30]mm · 8 of 28 slices shown (1 of 2)]
[im 1/28]
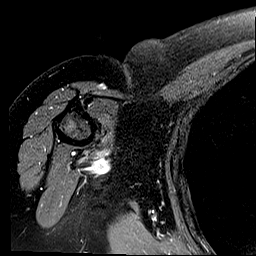
[im 4/28]
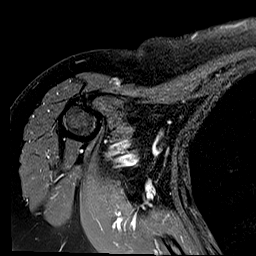
[im 10/28]
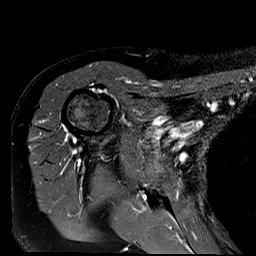
[im 13/28]
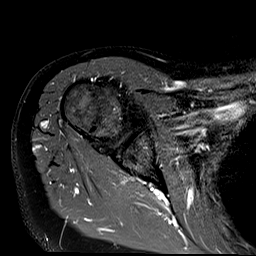
[im 16/28]
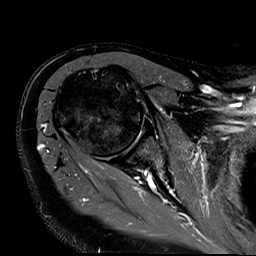
[im 19/28]
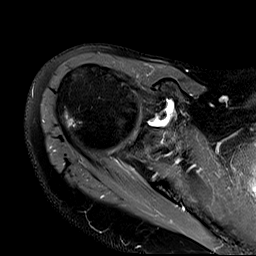
[im 25/28]
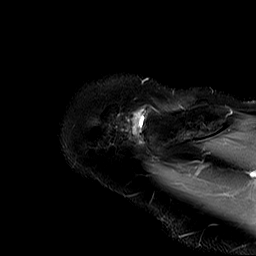
[im 28/28]
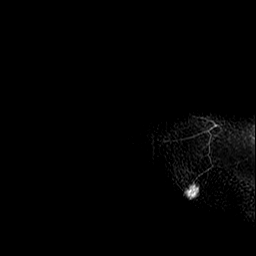

[Series 7: PD fat-sat · oblique · right · 4.0mm · 0.44mm/px · 8 of 24 slices shown (2 of 2)]
[im 1/24]
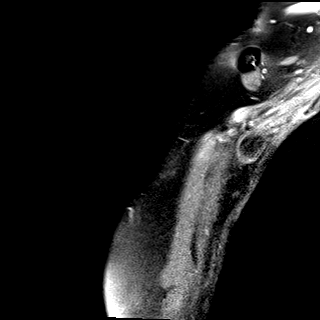
[im 4/24]
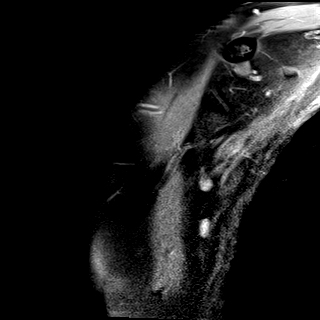
[im 7/24]
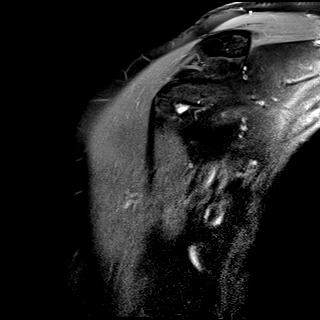
[im 10/24]
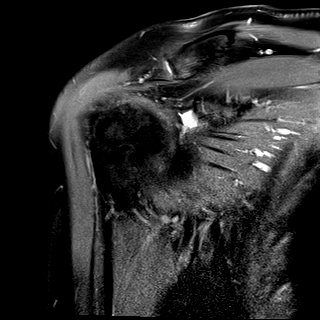
[im 14/24]
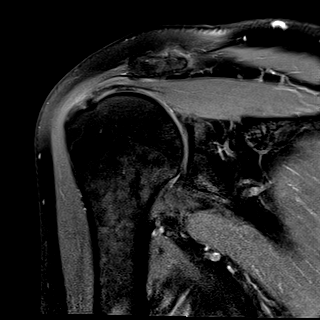
[im 17/24]
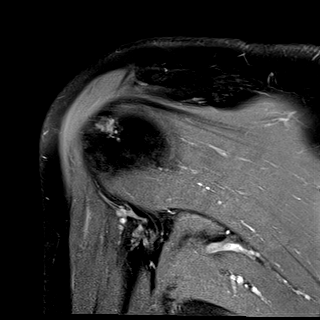
[im 20/24]
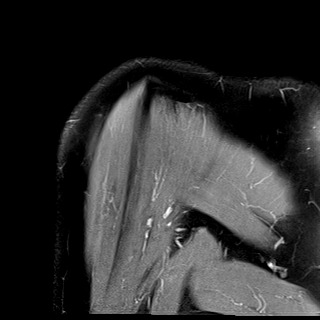
[im 24/24]
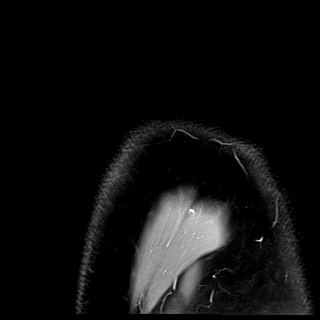

[Series 8: T2 fat-sat · oblique · right · 4.0mm · 0.44mm/px · 8 of 24 slices shown (1 of 2)]
[im 1/24]
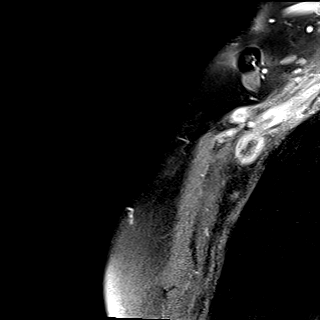
[im 4/24]
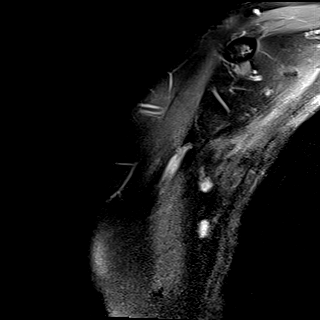
[im 7/24]
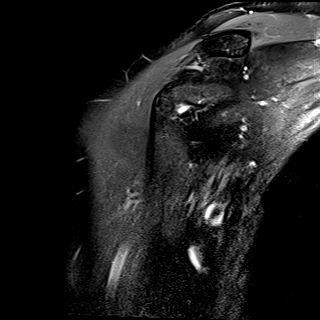
[im 10/24]
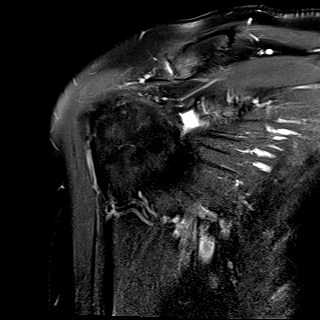
[im 14/24]
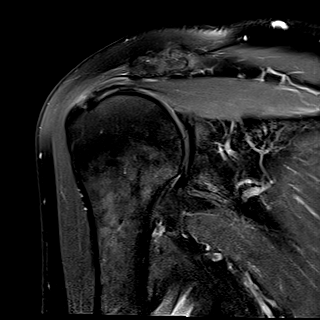
[im 17/24]
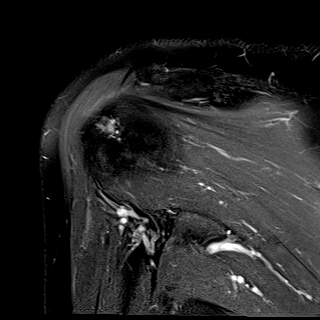
[im 20/24]
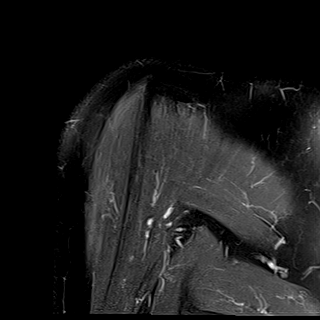
[im 24/24]
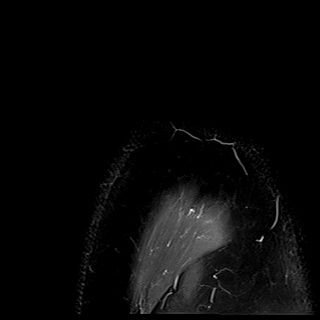

[Series 9: T2 fat-sat · oblique · right · 4.0mm · 0.23mm/px · 7 of 22 slices shown (2 of 2)]
[im 1/22]
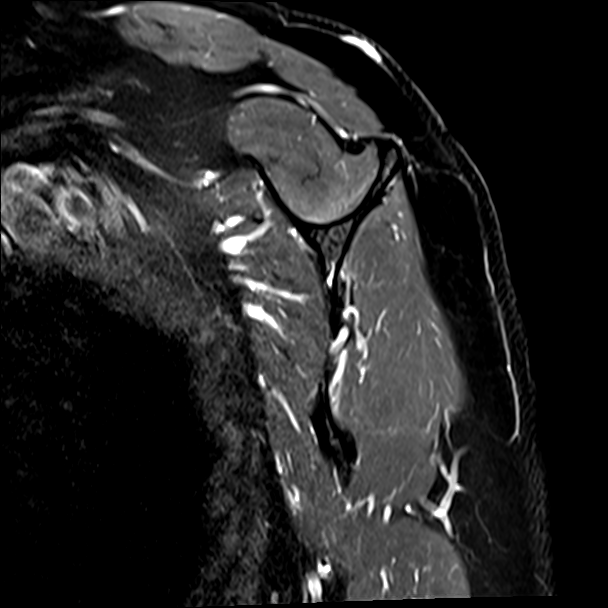
[im 4/22]
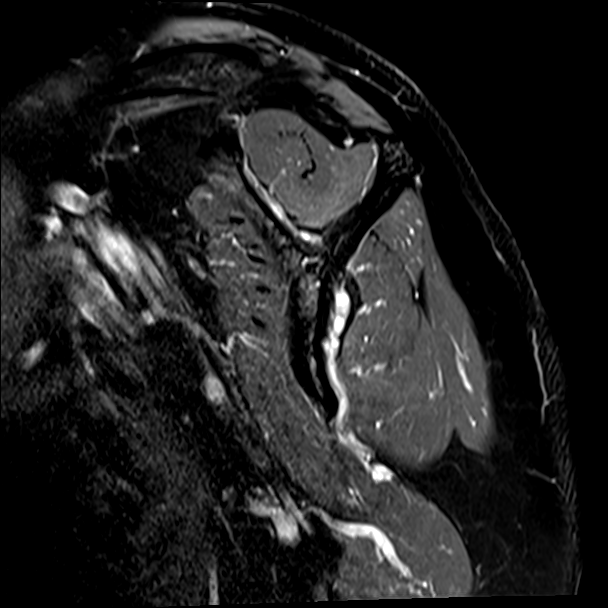
[im 8/22]
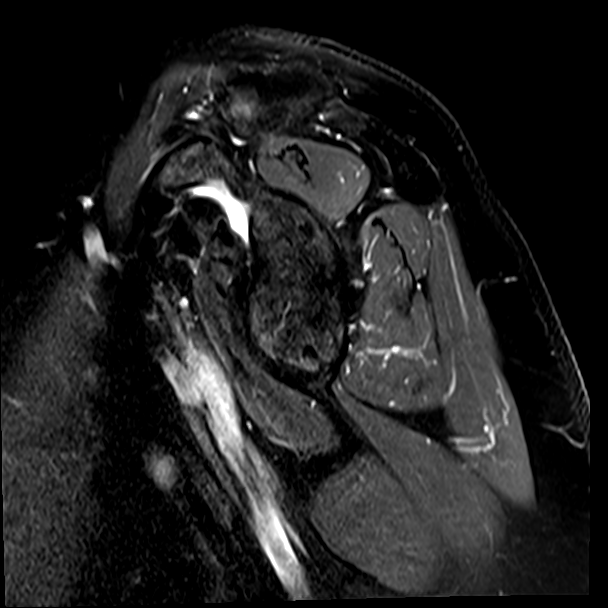
[im 11/22]
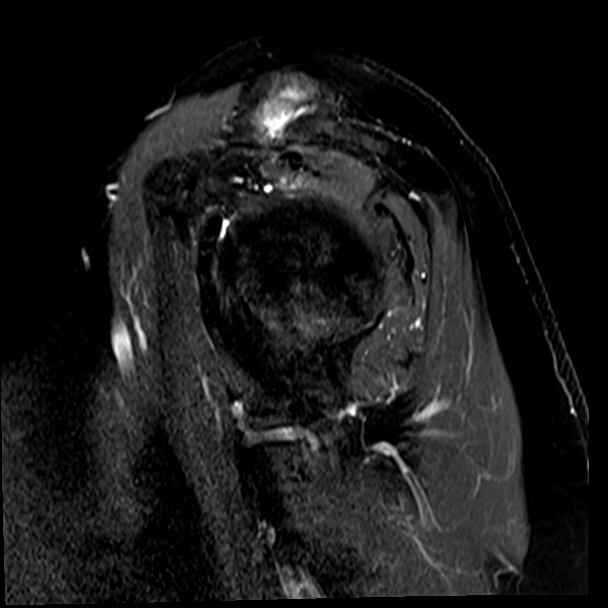
[im 15/22]
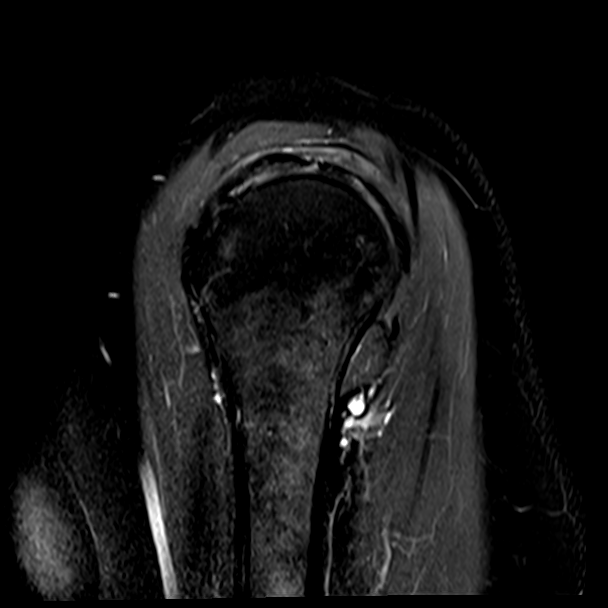
[im 18/22]
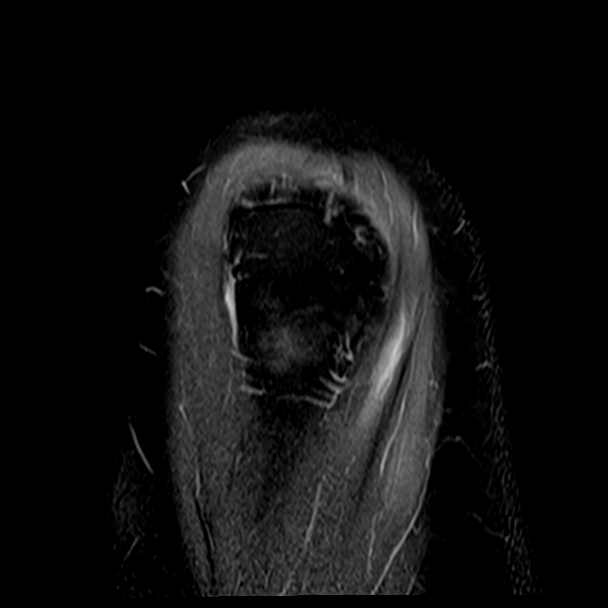
[im 22/22]
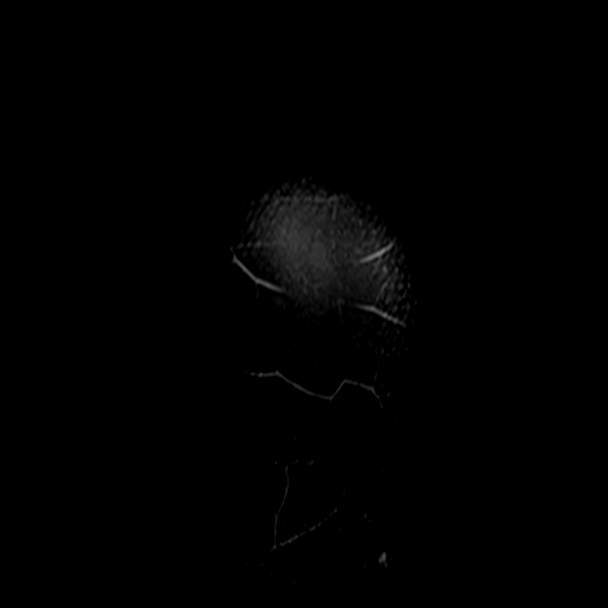

[Series 10: T1 · oblique · right · 4.0mm · 0.36mm/px · 2 of 22 slices shown]
[im 1/22]
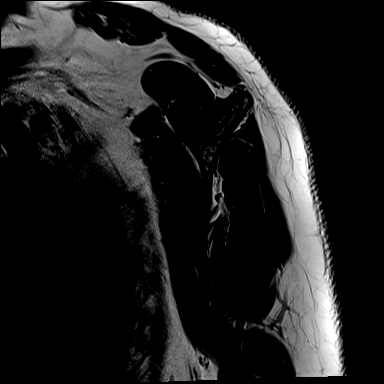
[im 4/22]
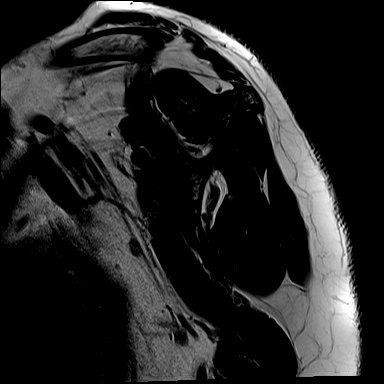

[33 of 40 positions shown; findings below may reference images not displayed]

FINDINGS: Rotator cuff: Intact. There is mildly increased T2 signal in the
supraspinatus and infraspinatus tendons.

Muscles:  Intact and normal in appearance.

Biceps long head:  Completely torn from the superior labrum.

Acromioclavicular Joint: Moderate to moderately severe
osteoarthritis. Type 2 acromion. No subacromial/subdeltoid bursal
fluid.

Glenohumeral Joint: Appears normal.

Labrum:  The superior labrum is blunted and degenerated.

Bones:  No fracture, contusion or worrisome lesion.

Other: None.
IMPRESSION: Complete tear of the long head of biceps from the superior labrum.

Mild appearing supraspinatus and infraspinatus tendinopathy without
tear.

Moderate to moderately severe acromioclavicular osteoarthritis.

Frayed and blunted superior labrum.

## 2020-07-30 ENCOUNTER — Other Ambulatory Visit: Payer: Self-pay | Admitting: Gastroenterology

## 2020-07-30 DIAGNOSIS — K760 Fatty (change of) liver, not elsewhere classified: Secondary | ICD-10-CM

## 2020-07-30 DIAGNOSIS — K8689 Other specified diseases of pancreas: Secondary | ICD-10-CM

## 2020-10-04 ENCOUNTER — Ambulatory Visit
Admission: RE | Admit: 2020-10-04 | Discharge: 2020-10-04 | Disposition: A | Discharge status: Home / Self Care | Payer: Medicare PPO | Source: Ambulatory Visit | Attending: Gastroenterology | Admitting: Gastroenterology

## 2020-10-04 ENCOUNTER — Other Ambulatory Visit: Payer: Self-pay

## 2020-10-04 DIAGNOSIS — K760 Fatty (change of) liver, not elsewhere classified: Secondary | ICD-10-CM | POA: Insufficient documentation

## 2020-10-04 DIAGNOSIS — K8689 Other specified diseases of pancreas: Secondary | ICD-10-CM | POA: Insufficient documentation

## 2020-10-04 IMAGING — US US ABDOMEN COMPLETE W/ ELASTOGRAPHY
2 of 3 series · 12 of 25 positions shown · non-contrast
Comparison: CT abdomen and pelvis [DATE], MRCP [DATE]

CLINICAL DATA: Fatty infiltration of liver, pancreatic
insufficiency, question hepatic fibrosis

EXAM:
ULTRASOUND ABDOMEN
ULTRASOUND HEPATIC ELASTOGRAPHY
TECHNIQUE: Sonography of the upper abdomen was performed. In addition,
ultrasound elastography evaluation of the liver was performed. A
region of interest was placed within the right lobe of the liver.
Following application of a compressive sonographic pulse, tissue
compressibility was assessed. Multiple assessments were performed at
the selected site. Median tissue compressibility was determined.
Previously, hepatic stiffness was assessed by shear wave velocity.
Based on recently published Society of Radiologists in Ultrasound
consensus article, reporting is now recommended to be performed in
the SI units of pressure (kiloPascals) representing hepatic
stiffness/elasticity. The obtained result is compared to the
published reference standards. (cACLD = compensated Advanced Chronic
Liver Disease)

[Series 1: us abdomen complete w/elastography · 11 of 120 slices shown]
[im 6/120]
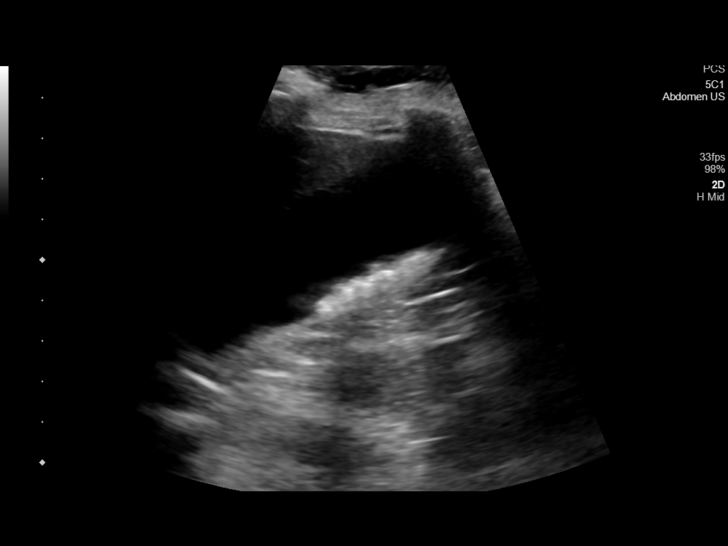
[im 18/120]
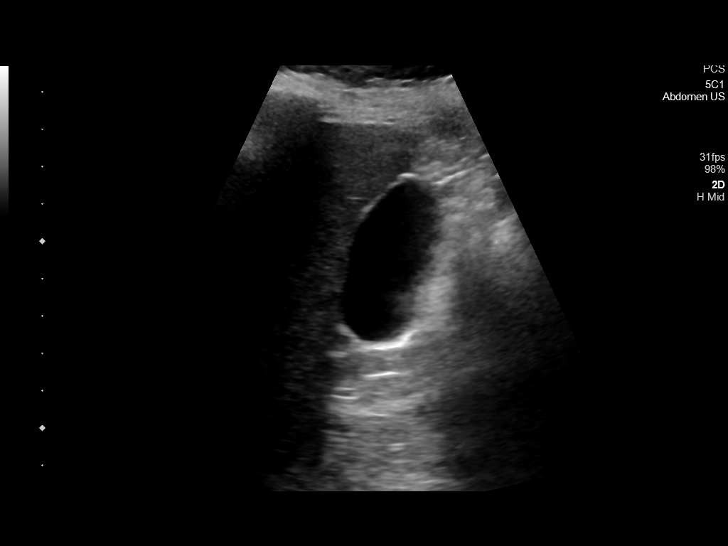
[im 29/120]
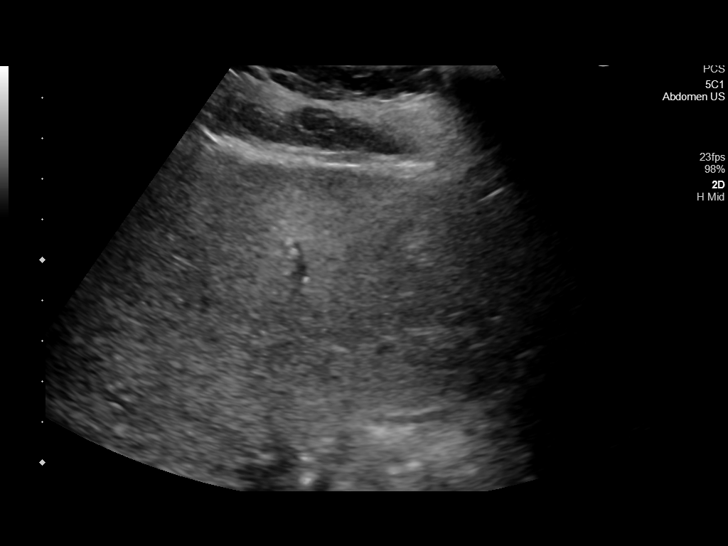
[im 40/120]
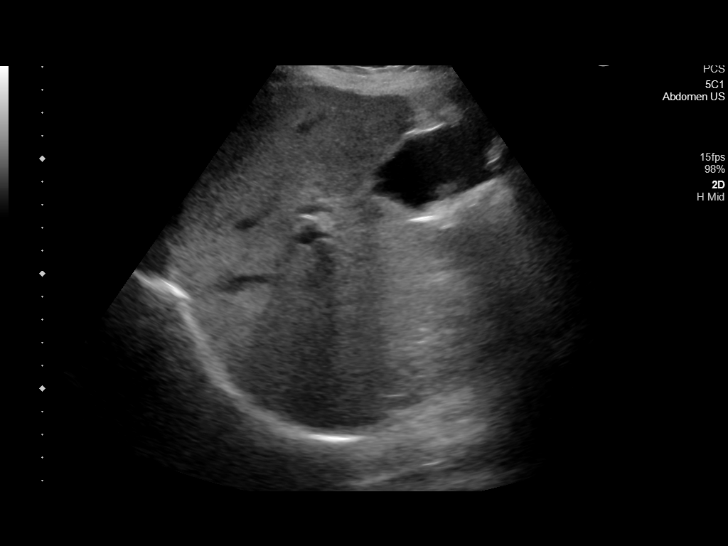
[im 52/120]
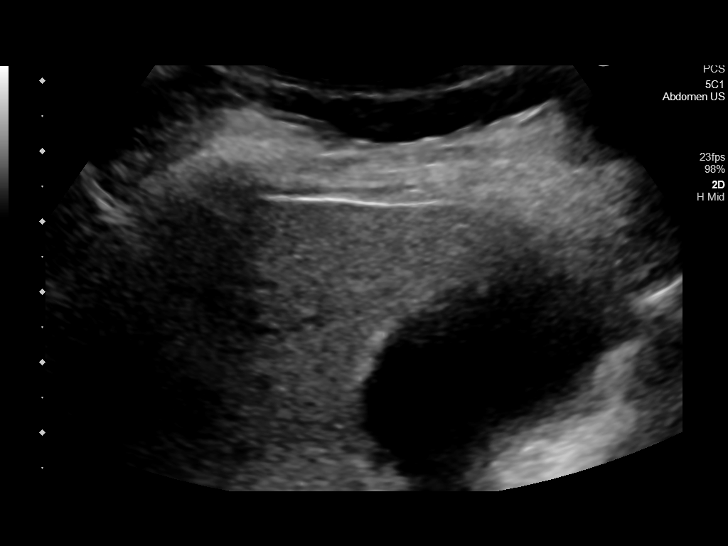
[im 63/120]
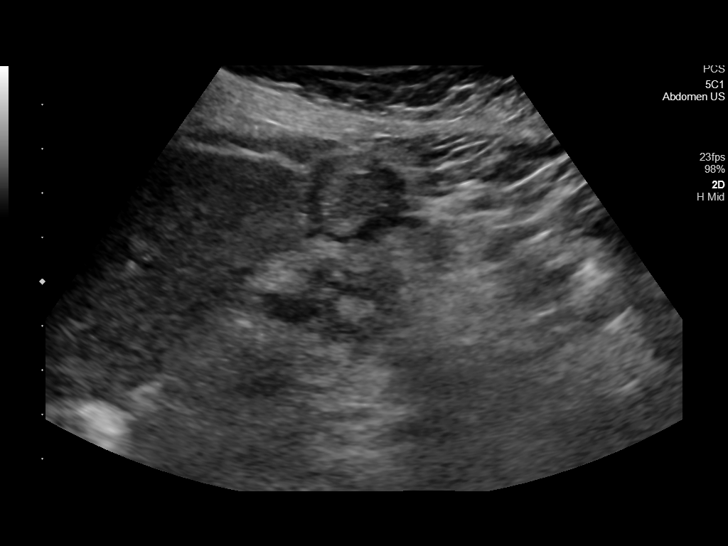
[im 74/120]
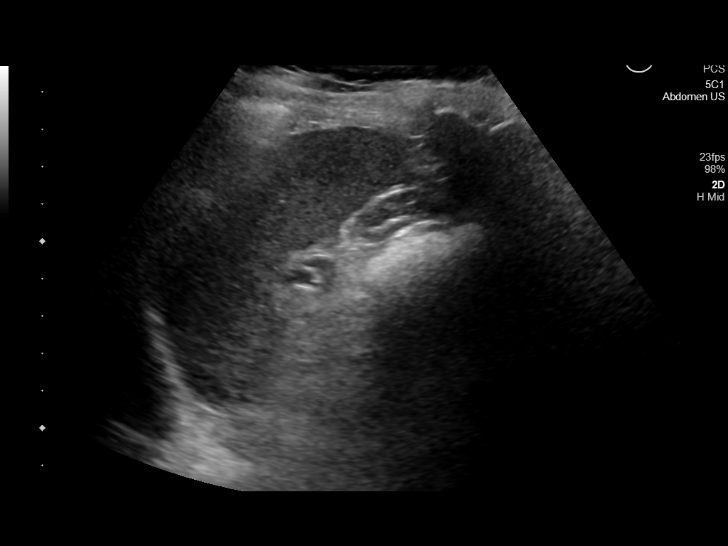
[im 86/120]
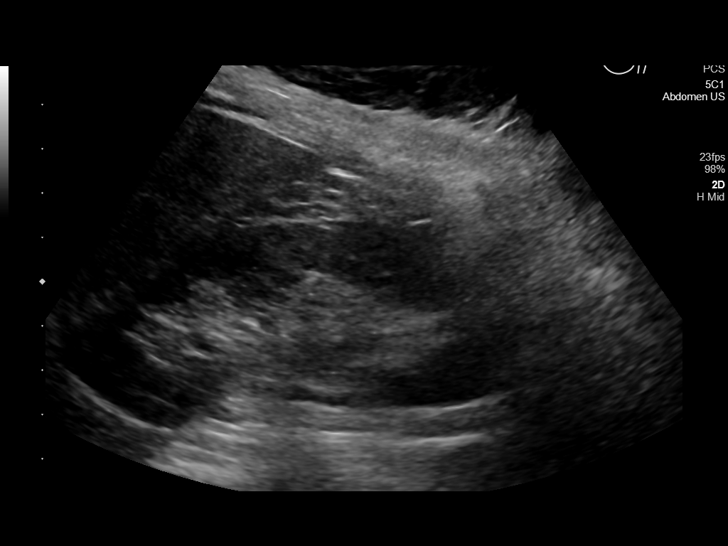
[im 97/120]
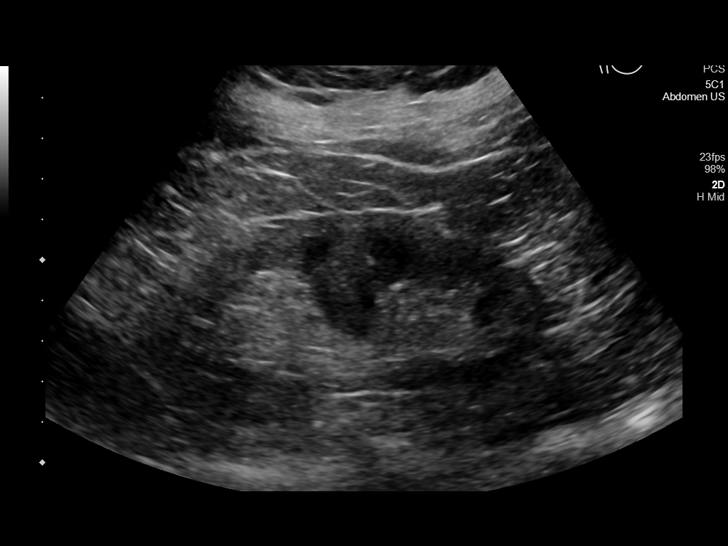
[im 108/120]
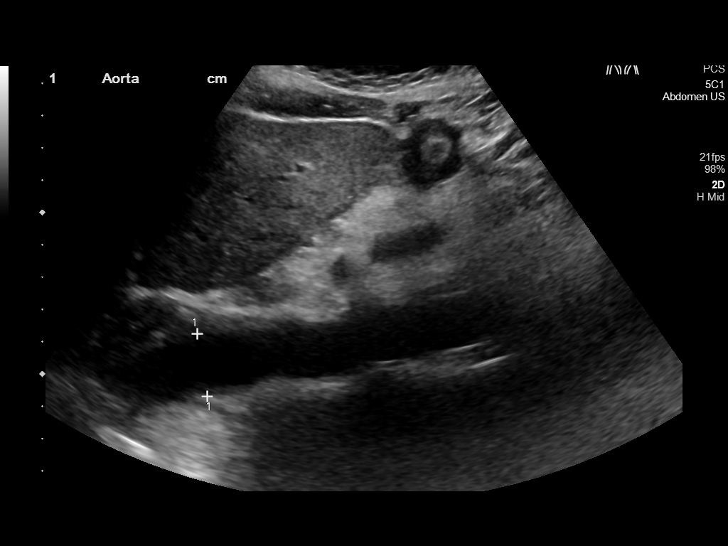
[im 120/120]
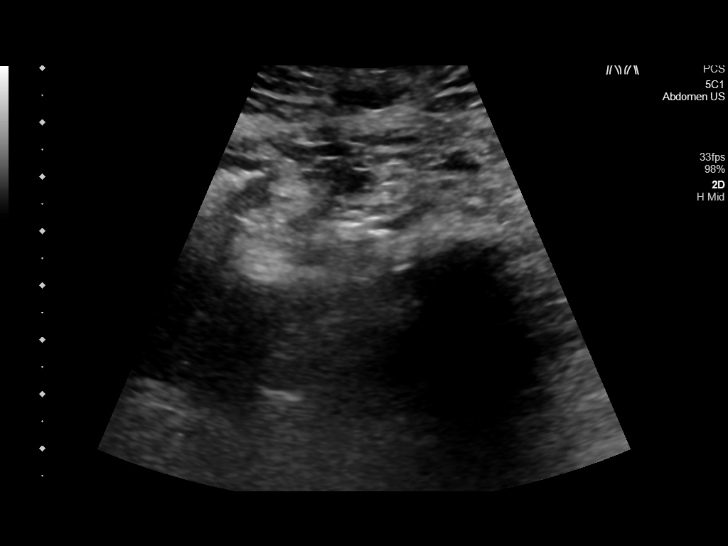

[Series 1001: abdomen us · 1 of 11 slices shown]
[im 11/11]
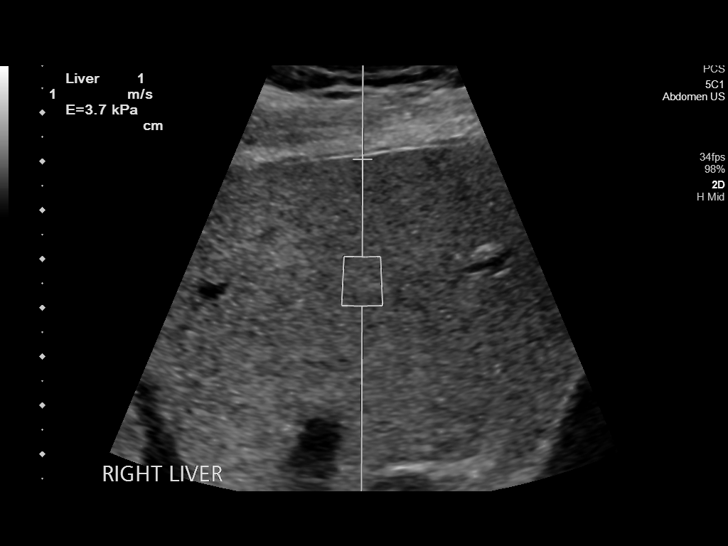

[12 of 25 positions shown; findings below may reference images not displayed]

FINDINGS: ULTRASOUND ABDOMEN

Gallbladder: Multiple shadowing calculi in gallbladder up to 7 mm.
No gallbladder wall thickening, pericholecystic fluid or sonographic
Murphy sign.

Common bile duct: Diameter: 4 mm, normal

Liver: Echogenic parenchyma, likely fatty infiltration though this
can be seen with cirrhosis and certain infiltrative disorders. No
focal hepatic mass or nodularity. No intrahepatic biliary
dilatation. Portal vein is patent on color Doppler imaging with
normal direction of blood flow towards the liver.

IVC: Normal appearance

Pancreas: Questionable hypoechoic mass at pancreatic tail region
x 1.8 x 4.5 cm, not seen on prior CT or MR exams. Remaining pancreas
appears fatty replaced

Spleen: Normal appearance, 6.5 cm length

Right Kidney: Length: 10.5 cm. Normal morphology without mass or
hydronephrosis.

Left Kidney: Length: 9.5 cm. Normal morphology without mass or
hydronephrosis.

Abdominal aorta: Normal caliber

Other findings: No free fluid

ULTRASOUND HEPATIC ELASTOGRAPHY

Device: Siemens Helix VTQ

Patient position: Supine

Transducer [id number]

Number of measurements: 10

Hepatic segment:  8

Median kPa:

IQR:

IQR/Median kPa ratio:

Data quality:  Good

Diagnostic category:  < or = 5 kPa: high probability of being normal

The use of hepatic elastography is applicable to patients with viral
hepatitis and non-alcoholic fatty liver disease. At this time, there
is insufficient data for the referenced cut-off values and use in
other causes of liver disease, including alcoholic liver disease.
Patients, however, may be assessed by elastography and serve as
their own reference standard/baseline.

In patients with non-alcoholic liver disease, the values suggesting
compensated advanced chronic liver disease (cACLD) may be lower, and
patients may need additional testing with elasticity results of [DATE]
kPa.

Please note that abnormal hepatic elasticity and shear wave
velocities may also be identified in clinical settings other than
with hepatic fibrosis, such as: acute hepatitis, elevated right
heart and central venous pressures including use of beta blockers,
PIANISTA disease (PIANISTA), infiltrative processes such as
mastocytosis/amyloidosis/infiltrative tumor/lymphoma, extrahepatic
cholestasis, with hyperemia in the post-prandial state, and with
liver transplantation. Correlation with patient history, laboratory
data, and clinical condition recommended.

Diagnostic Categories:

< or =5 kPa: high probability of being normal

< or =9 kPa: in the absence of other known clinical signs, rules [DATE] kPa and ?13 kPa: suggestive of cACLD, but needs further testing

>13 kPa: highly suggestive of cACLD

> or =17 kPa: highly suggestive of cACLD with an increased
probability of clinically significant portal hypertension
IMPRESSION: ULTRASOUND ABDOMEN:

Probable mild fatty infiltration of liver without focal hepatic mass
or nodularity.

Questionable mass at pancreatic tail 2.1 x 1.8 x 4.5 cm, not seen on
previous imaging; focused CT of the pancreas with and without
contrast recommended.

Cholelithiasis.

ULTRASOUND HEPATIC ELASTOGRAPHY:

Median kPa:

Diagnostic category:  < or = 5 kPa: high probability of being normal

## 2020-10-08 ENCOUNTER — Other Ambulatory Visit (HOSPITAL_COMMUNITY): Payer: Self-pay | Admitting: Gastroenterology

## 2020-10-08 ENCOUNTER — Other Ambulatory Visit: Payer: Self-pay | Admitting: Gastroenterology

## 2020-10-08 DIAGNOSIS — K8689 Other specified diseases of pancreas: Secondary | ICD-10-CM

## 2020-10-08 DIAGNOSIS — R935 Abnormal findings on diagnostic imaging of other abdominal regions, including retroperitoneum: Secondary | ICD-10-CM

## 2020-10-17 ENCOUNTER — Ambulatory Visit
Admission: RE | Admit: 2020-10-17 | Discharge: 2020-10-17 | Disposition: A | Discharge status: Home / Self Care | Payer: Medicare PPO | Source: Ambulatory Visit | Attending: Gastroenterology | Admitting: Gastroenterology

## 2020-10-17 ENCOUNTER — Other Ambulatory Visit: Payer: Self-pay

## 2020-10-17 DIAGNOSIS — R935 Abnormal findings on diagnostic imaging of other abdominal regions, including retroperitoneum: Secondary | ICD-10-CM | POA: Insufficient documentation

## 2020-10-17 DIAGNOSIS — K8689 Other specified diseases of pancreas: Secondary | ICD-10-CM | POA: Insufficient documentation

## 2020-10-17 IMAGING — CT CT ABD-PEL WO/W CM
2 of 11 series · 13 of 46 positions shown, 15 images · IV contrast (omnipaque)
Comparison: CT the abdomen and pelvis [DATE].

CLINICAL DATA: 79-year-old female with history of possible mass in
the pancreas. Follow-up study.

EXAM:
CT ABDOMEN AND PELVIS WITHOUT AND WITH CONTRAST
TECHNIQUE: Multidetector CT imaging of the abdomen and pelvis was performed
following the standard protocol before and following the bolus
administration of intravenous contrast.
CONTRAST:  100mL OMNIPAQUE IOHEXOL 300 MG/ML  SOLN

[Series 7: coronal arterial · coronal · arterial · 0.55mm/px · 3 of 86 slices shown]
[im 22/86  soft-tissue]
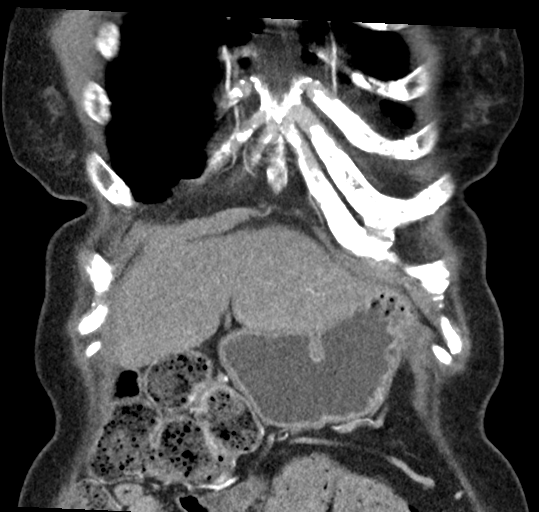
[im 43/86  soft-tissue]
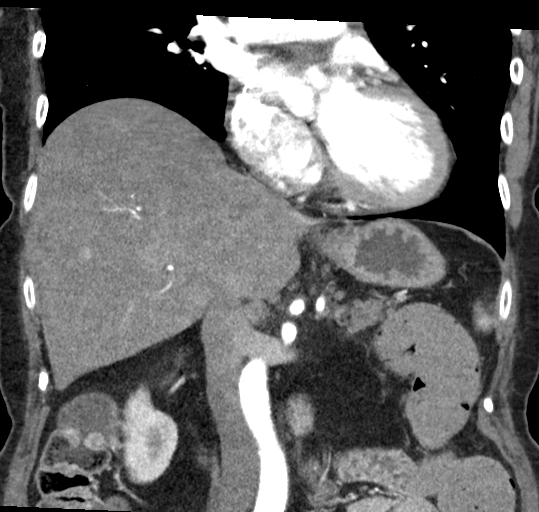
[im 64/86  soft-tissue]
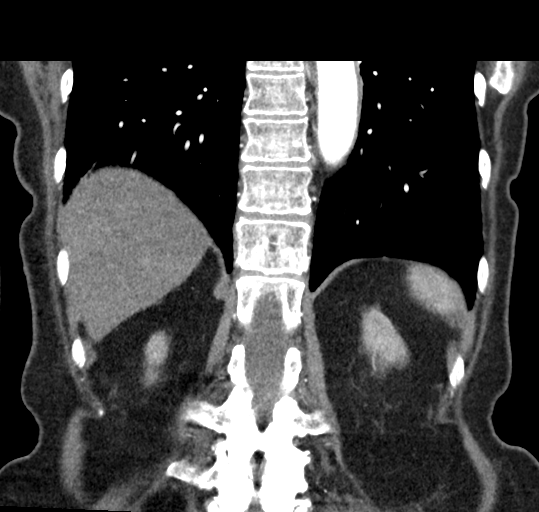

[Series 9: axial venous · axial · portal-venous · 0.69mm/px · z∈[-444,-40]mm · 10 of 167 slices shown, 12 images]
[im 16/167  soft-tissue]
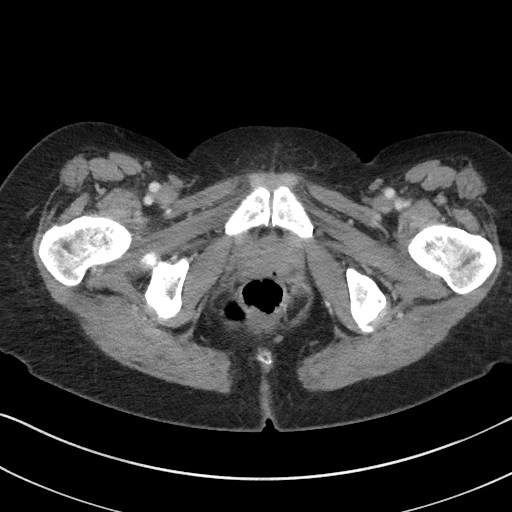
[im 16/167  bone]
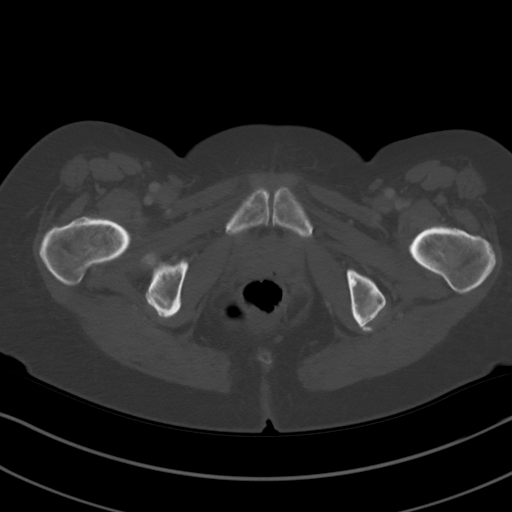
[im 31/167  soft-tissue]
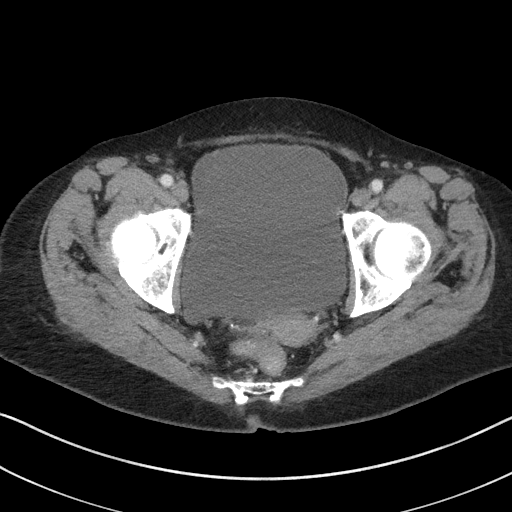
[im 46/167  soft-tissue]
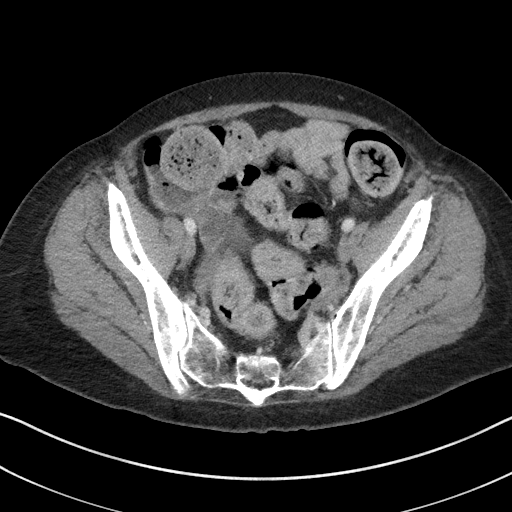
[im 61/167  soft-tissue]
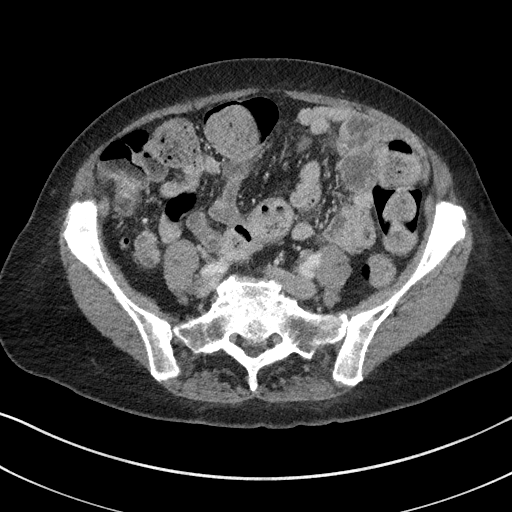
[im 76/167  soft-tissue]
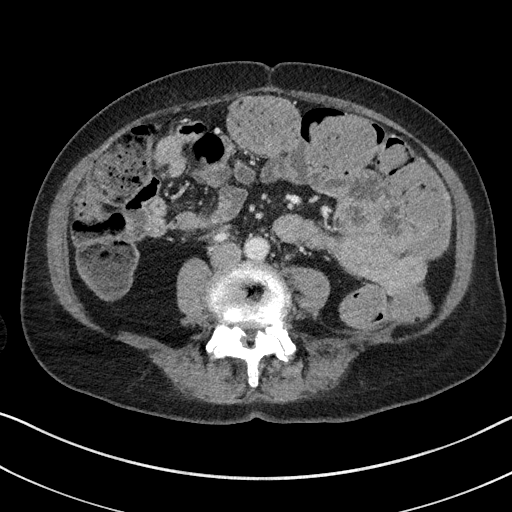
[im 91/167  soft-tissue]
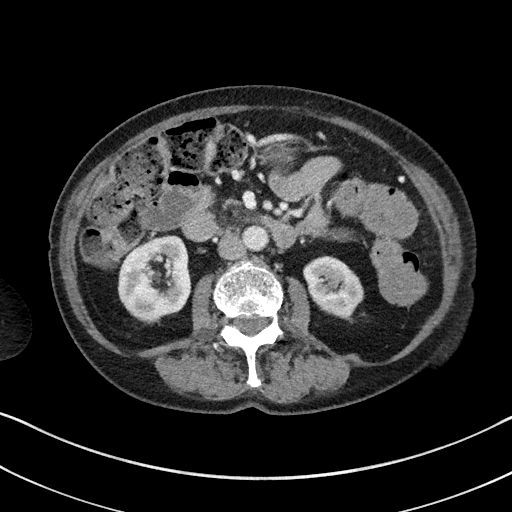
[im 106/167  soft-tissue]
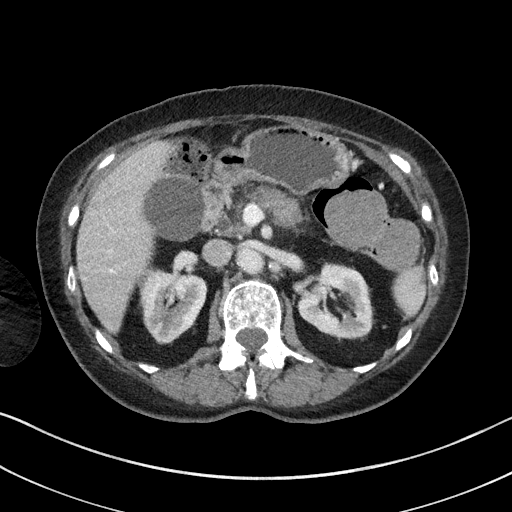
[im 121/167  soft-tissue]
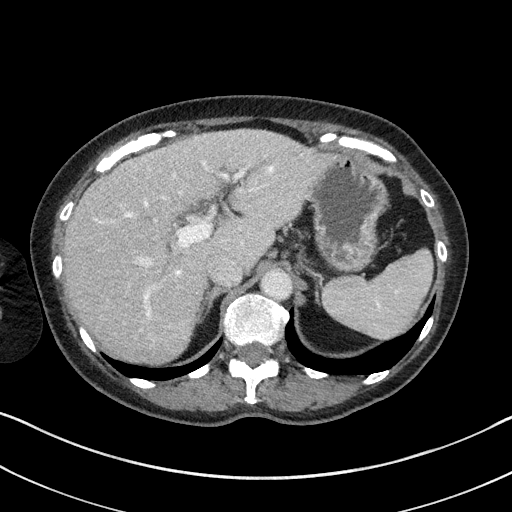
[im 136/167  soft-tissue]
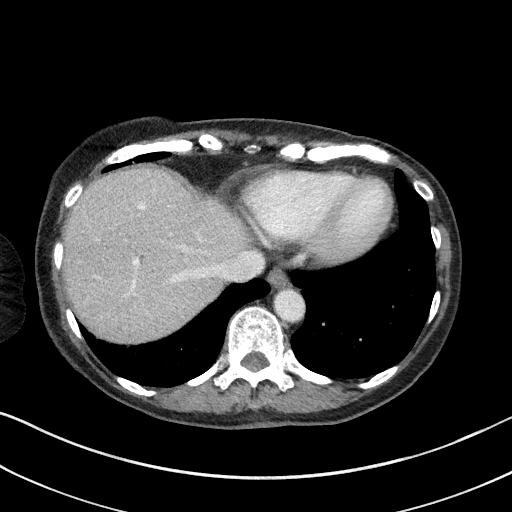
[im 136/167  bone]
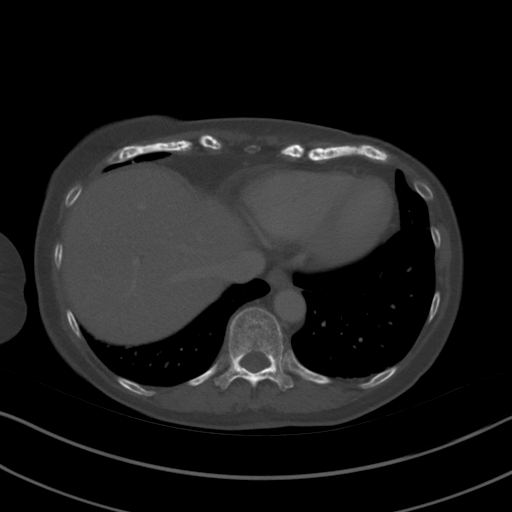
[im 151/167  soft-tissue]
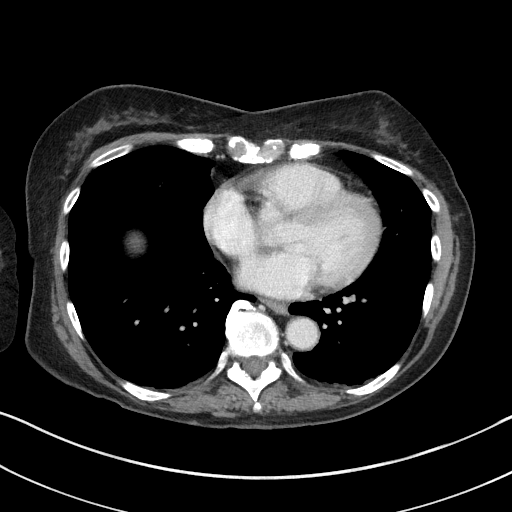

[13 of 46 positions shown; findings below may reference images not displayed]

FINDINGS: Lower chest: Tiny calcified granuloma in the periphery of the left
lower lobe.

Hepatobiliary: Mild diffuse low attenuation throughout the hepatic
parenchyma, indicative of hepatic steatosis. Ill-defined
hypovascular lesion in segment 8 of the liver (axial image 35 of
series 9) measuring 1.6 x 1.4 cm, concerning for potential
metastatic lesion. Additional hypovascular lesion measuring 1.7 x
1.0 cm (axial image 24 of series 9), concerning for a metastatic
lesion. An additional mild diffuse low attenuation throughout the
hepatic parenchyma, indicative of hepatic steatosis. 1.5 x 0.8 cm
low-attenuation lesion in the periphery of segment 6 of the liver,
compatible with a simple cyst. No intra or extrahepatic biliary
ductal dilatation. Gallbladder is normal in appearance.

Pancreas: There continues to be severe fatty atrophy in the
pancreas, most evident in the head and uncinate process. When
compared to the prior examination, there is now a large mass in the
body of the pancreas (axial image 58 of series 9 and coronal image
36 of series 12) measuring 5.6 x 2.2 x 2.9 cm. This lesion causes
obstruction of the pancreatic duct resulting in ductal dilatation
throughout the tail of the pancreas where the duct measures up to 5
mm in diameter. This lesion comes in direct contact with the distal
superior mesenteric vein and splenoportal confluence, and there is a
filling defect which extends from the point of contact into the
lumen of the portal vein, likely to represent a small amount of
tumor thrombus. Splenic vein appears chronically occluded with
several collateral pathways. This lesion also comes in contact with
the undersurface of the splenic artery, which remains patent at this
time. There is separation between the lesion and the common hepatic
artery as well as the celiac axis. No peripancreatic fluid
collections or inflammatory changes.

Spleen: Unremarkable.

Adrenals/Urinary Tract: Tiny nonobstructive calculi in the
collecting systems of both kidneys measuring up to 3 mm in the lower
pole the left kidney. No suspicious renal lesions. No
hydroureteronephrosis. Urinary bladder is moderately distended, but
otherwise unremarkable in appearance. Bilateral adrenal glands are
normal in appearance.

Stomach/Bowel: Normal appearance of the stomach. No pathologic
dilatation of small bowel or colon. Normal appendix.

Vascular/Lymphatic: Aortic atherosclerosis, without evidence of
aneurysm or dissection in the abdominal or pelvic vasculature.
Vascular findings pertinent to probable pancreatic neoplasm, as
detailed above. 11 mm short axis portacaval lymph node concerning
for metastatic lesion (axial image 55 of series 9). 7 mm celiac axis
node (axial image 48 of series 9) also suspicious, but technically
not enlarged. 1.0 x 1.3 cm soft tissue attenuation nodule in the
subcutaneous fat of the right inguinal region (axial image 145 of
series 9), potentially an enlarged lymph node, but stable compared
to remote prior examination from [DATE], presumably benign.

Reproductive: Bilateral tubal ligation clips are noted. Uterus and
ovaries are otherwise unremarkable in appearance.

Other: No significant volume of ascites.  No pneumoperitoneum.

Musculoskeletal: There are no aggressive appearing lytic or blastic
lesions noted in the visualized portions of the skeleton.
IMPRESSION: 1. Large mass in the pancreatic body measuring approximately 5.6 x
2.2 x 2.9 cm which is locally invasive, including probable direct
extension into the splenoportal confluence with tumor thrombus
extending into the proximal portal vein, regional upper abdominal
lymphadenopathy, and at least 2 suspicious hepatic lesions which may
represent metastatic lesions, as detailed above. Further evaluation
with nonemergent abdominal MRI with and without IV gadolinium with
MRCP is suggested to better evaluate the hepatic lesions.
2. Hepatic steatosis.
3. Nonobstructive calculi in the collecting systems of both kidneys
measuring up to 3 mm in the lower pole of the left kidney.
4. Additional incidental findings, as above.

These results will be called to the ordering clinician or
representative by the Radiologist Assistant, and communication
documented in the PACS or [REDACTED].

## 2020-10-17 MED ORDER — IOHEXOL 300 MG/ML  SOLN
100.0000 mL | Freq: Once | INTRAMUSCULAR | Status: AC | PRN
  Administered 2020-10-17: 100 mL via INTRAVENOUS

## 2020-10-28 ENCOUNTER — Other Ambulatory Visit (HOSPITAL_COMMUNITY): Payer: Self-pay | Admitting: Gastroenterology

## 2020-10-28 ENCOUNTER — Other Ambulatory Visit: Payer: Self-pay | Admitting: Gastroenterology

## 2020-10-28 DIAGNOSIS — K8689 Other specified diseases of pancreas: Secondary | ICD-10-CM

## 2020-10-28 DIAGNOSIS — R591 Generalized enlarged lymph nodes: Secondary | ICD-10-CM

## 2020-10-28 DIAGNOSIS — K769 Liver disease, unspecified: Secondary | ICD-10-CM

## 2020-11-05 ENCOUNTER — Ambulatory Visit
Admission: RE | Admit: 2020-11-05 | Discharge: 2020-11-05 | Disposition: A | Discharge status: Home / Self Care | Payer: Medicare PPO | Source: Ambulatory Visit | Attending: Gastroenterology | Admitting: Gastroenterology

## 2020-11-05 ENCOUNTER — Other Ambulatory Visit: Payer: Self-pay | Admitting: Gastroenterology

## 2020-11-05 ENCOUNTER — Other Ambulatory Visit: Payer: Self-pay

## 2020-11-05 DIAGNOSIS — R591 Generalized enlarged lymph nodes: Secondary | ICD-10-CM

## 2020-11-05 DIAGNOSIS — K769 Liver disease, unspecified: Secondary | ICD-10-CM

## 2020-11-05 DIAGNOSIS — K8689 Other specified diseases of pancreas: Secondary | ICD-10-CM

## 2020-11-05 IMAGING — MR MR ABDOMEN WO/W CM MRCP
17 of 19 series · 43 of 48 positions shown · IV contrast (gadavist)
Comparison: CT [DATE]

CLINICAL DATA: Pancreas neoplasm

EXAM:
MRI ABDOMEN WITHOUT AND WITH CONTRAST (INCLUDING MRCP)
TECHNIQUE: Multiplanar multisequence MR imaging of the abdomen was performed
both before and after the administration of intravenous contrast.
Heavily T2-weighted images of the biliary and pancreatic ducts were
obtained, and three-dimensional MRCP images were rendered by post
processing.
CONTRAST:  7mL GADAVIST GADOBUTROL 1 MMOL/ML IV SOLN

[Series 3: T2 · coronal · 6.0mm · 1.00mm/px · 2 of 30 slices shown (1 of 2)]
[im 1/30]
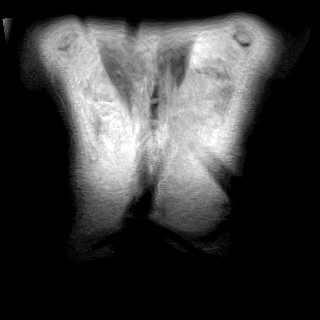
[im 30/30]
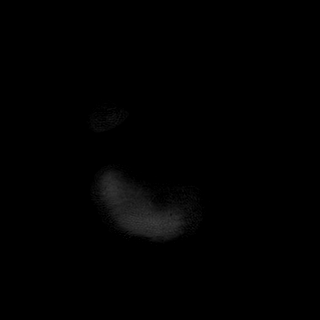

[Series 4: T2 · axial · 6.0mm · 1.19mm/px · z∈[-123,+129]mm · 2 of 36 slices shown (2 of 2)]
[im 1/36]
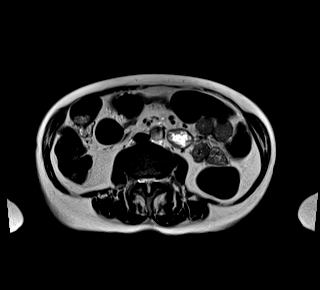
[im 36/36]
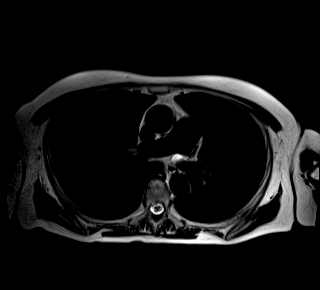

[Series 5: T1 · axial · 6.0mm · 0.74mm/px · z∈[-123,+129]mm · 4 of 72 slices shown]
[im 1/72]
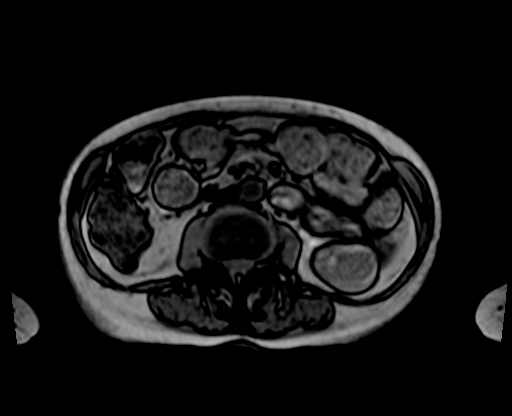
[im 24/72]
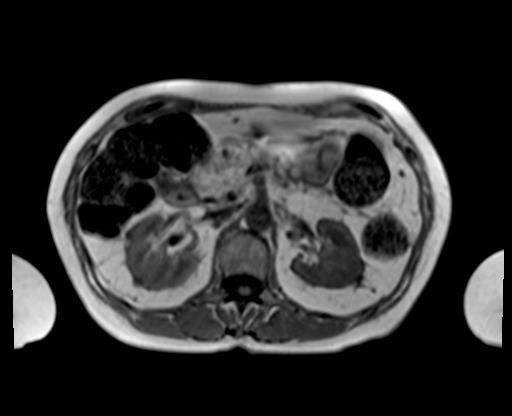
[im 48/72]
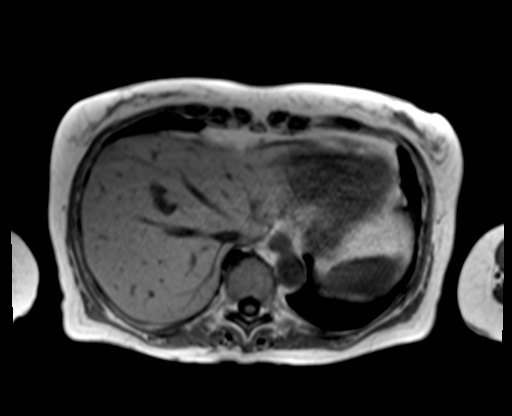
[im 72/72]
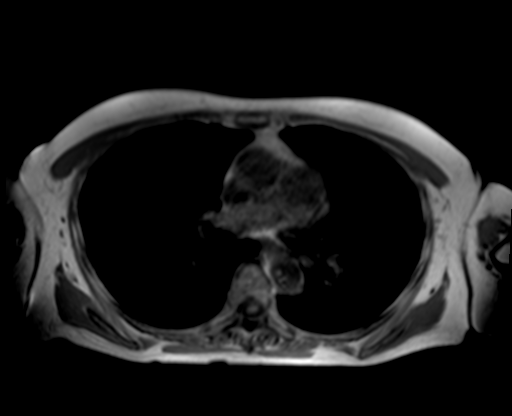

[Series 8: T2 fat-sat · axial · 6.0mm · 1.19mm/px · 1 of 36 slices shown]
[im 1/36]
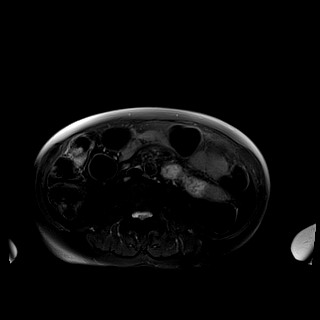

[Series 9: ax dwi_tracew · axial · 6.0mm · 1.42mm/px · z∈[-126,+126]mm · 4 of 108 slices shown]
[im 1/108]
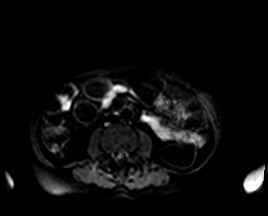
[im 36/108]
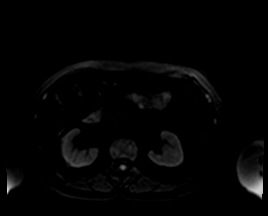
[im 72/108]
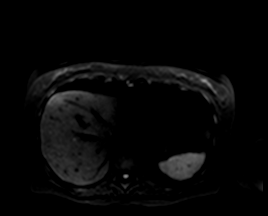
[im 108/108]
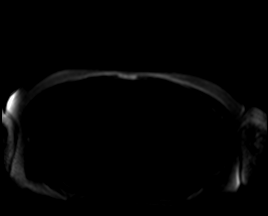

[Series 10: ax dwi_adc · axial · 6.0mm · 1.42mm/px · 1 of 36 slices shown]
[im 1/36]
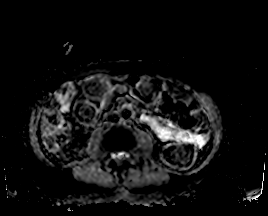

[Series 14: MRCP · coronal · 3.0mm · 1.12mm/px · 1 of 19 slices shown]
[im 1/19]
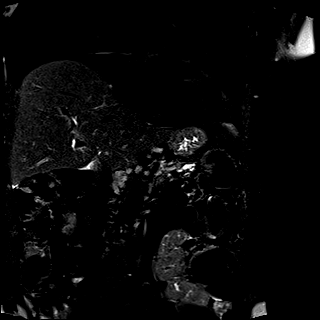

[Series 15: radials · coronal · 50.0mm · 0.78mm/px · 1 of 4 slices shown]
[im 1/4]
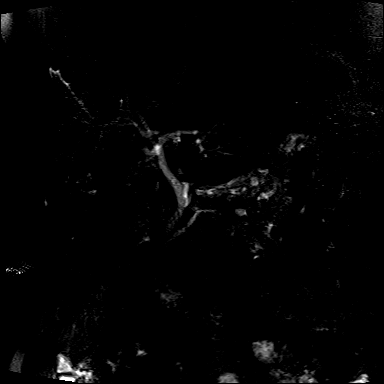

[Series 16: T1 dynamic fat-sat · axial · non-contrast · 3.5mm · 1.19mm/px · z∈[-135,+141]mm · 3 of 80 slices shown (1 of 4)]
[im 1/80]
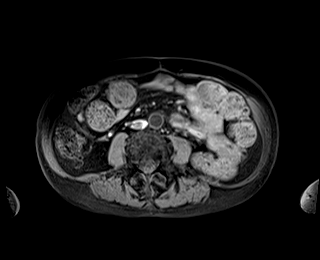
[im 40/80]
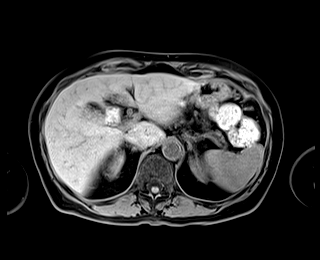
[im 80/80]
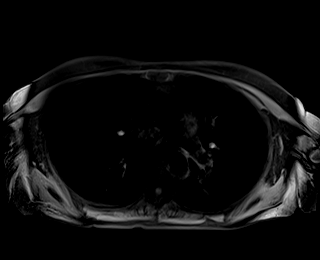

[Series 17: T1 dynamic fat-sat post-contrast · axial · 3.5mm · 1.19mm/px · z∈[-135,+141]mm · 3 of 80 slices shown (1 of 4)]
[im 1/80]
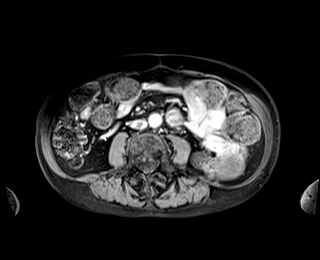
[im 40/80]
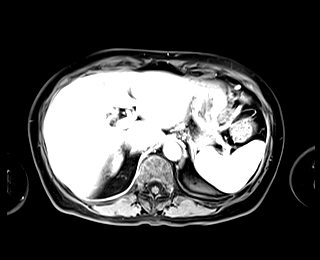
[im 80/80]
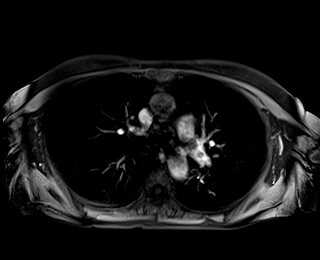

[Series 18: T1 dynamic fat-sat · axial · 3.5mm · 1.19mm/px · z∈[-135,+141]mm · 3 of 80 slices shown (2 of 4)]
[im 1/80]
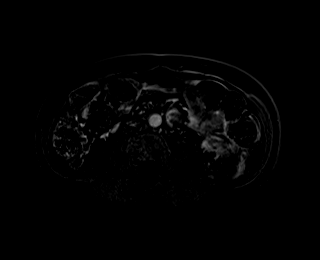
[im 40/80]
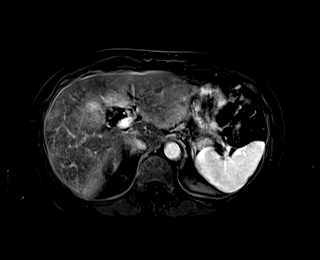
[im 80/80]
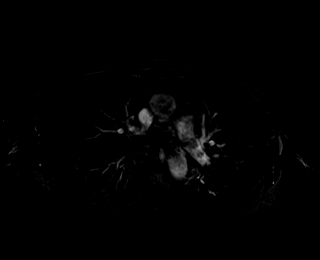

[Series 19: T1 dynamic fat-sat post-contrast · axial · 3.5mm · 1.19mm/px · z∈[-135,+141]mm · 3 of 80 slices shown (2 of 4)]
[im 1/80]
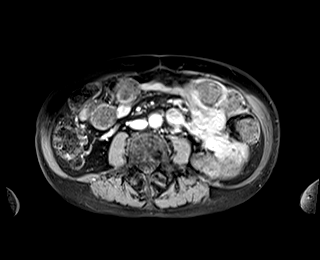
[im 40/80]
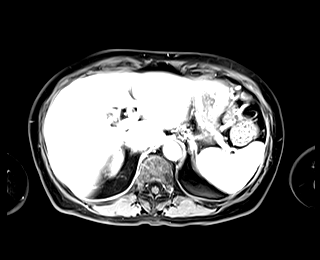
[im 80/80]
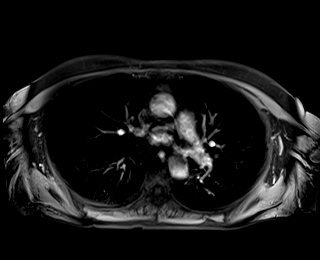

[Series 20: T1 dynamic fat-sat · axial · 3.5mm · 1.19mm/px · z∈[-135,+141]mm · 3 of 80 slices shown (3 of 4)]
[im 1/80]
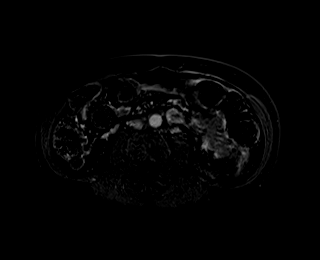
[im 40/80]
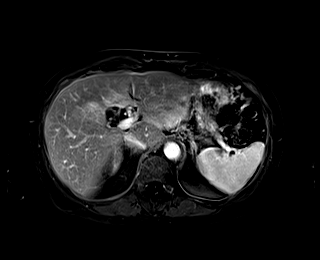
[im 80/80]
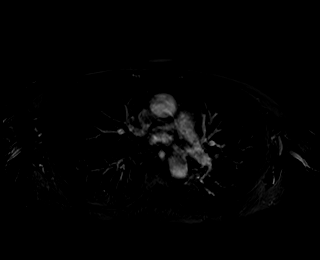

[Series 21: T1 dynamic fat-sat post-contrast · axial · 3.5mm · 1.19mm/px · z∈[-135,+141]mm · 3 of 80 slices shown (3 of 4)]
[im 1/80]
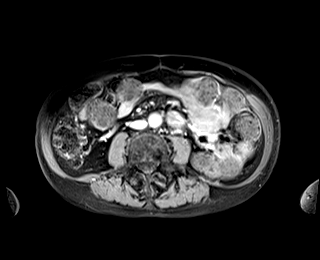
[im 40/80]
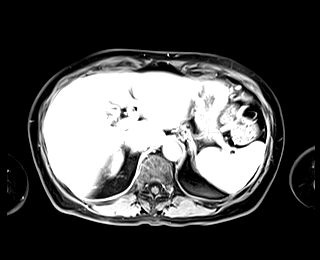
[im 80/80]
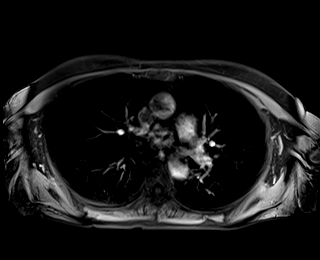

[Series 22: T1 dynamic fat-sat · axial · 3.5mm · 1.19mm/px · z∈[-135,+141]mm · 3 of 80 slices shown (4 of 4)]
[im 1/80]
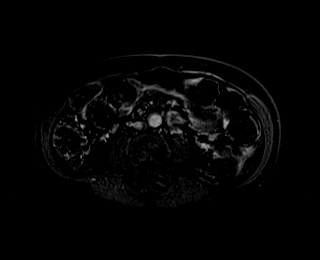
[im 40/80]
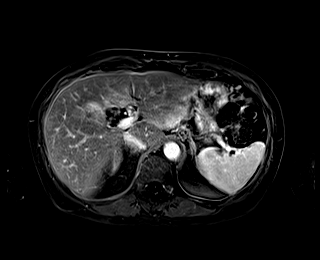
[im 80/80]
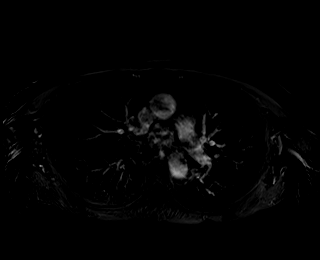

[Series 23: T1 dynamic post-contrast · coronal · 3.0mm · 1.12mm/px · 3 of 72 slices shown]
[im 1/72]
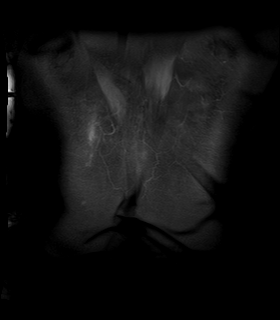
[im 36/72]
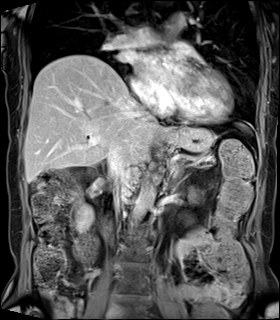
[im 72/72]
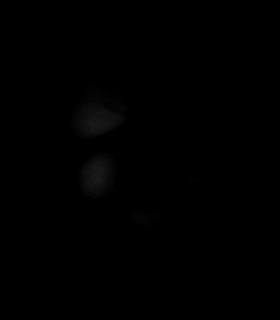

[Series 24: T1 dynamic fat-sat post-contrast · axial · 3.5mm · 1.19mm/px · z∈[-135,+141]mm · 3 of 80 slices shown (4 of 4)]
[im 1/80]
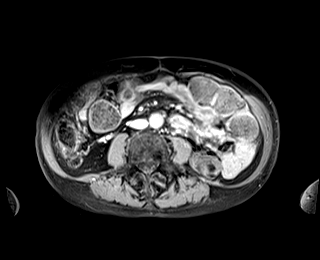
[im 40/80]
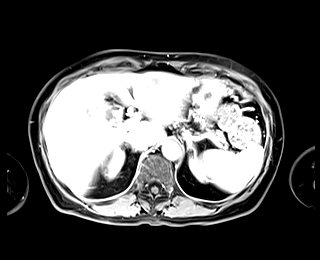
[im 80/80]
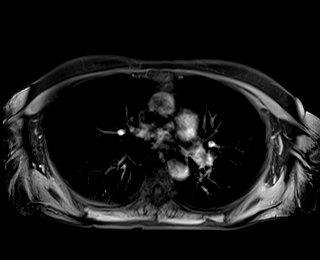

[43 of 48 positions shown; findings below may reference images not displayed]

FINDINGS: Lower chest: No acute findings.

Hepatobiliary: Diffuse hepatic steatosis.

Multifocal areas of restricted diffusion noted predominantly
involving segment 7 and segment 8 of the liver. On the arterial
phase postcontrast images these areas show relative hyperintensity.
The largest area of restricted diffusion is in segment [DATE] and
measures 2.9 x 1.9 cm, image 33/7. On the portal venous and delayed
phase images most of these areas are isointense to the liver with
the exception the dominant lesion in segment [DATE]. Only 1 area of
restricted diffusion has a corresponding area of both increased T2
and T1 signal which is located in the posteromedial dome of liver
measuring 1.1 cm, image 14/16 and image [DATE].

Simple appearing cyst is noted within the posterior right hepatic
lobe measuring 1.4 cm.

Pancreas: Mass involving the body and proximal tail of pancreas is
again noted. This measures 5.3 x 2.2 by 2.7 cm, image [DATE]. Atrophy
of the mid and distal tail of pancreas with dilatation of the main
duct is noted as seen on recent CT. Tumor involving of the portal
venous confluence is again noted which appears partially encased
with signs of nonocclusive thrombus within the lumen of the portal
venous confluence, image 43/23. Splenic vein appears completely
occluded. No signs of tumor involvement of the celiac artery. The
superior mesenteric artery extends posterior to the mass and appears
patent.

Spleen:  Within normal limits in size and appearance.

Adrenals/Urinary Tract: Normal appearance of the adrenal glands no
kidney mass or hydronephrosis. Right posterior kidney cyst measures
8 mm, image [DATE].

Stomach/Bowel: The stomach appears nondistended. The pancreatic
lesion extends up to and has contact with the posterior wall of the
body of stomach, image [DATE]. No signs of duodenal involvement by
tumor. No abnormal dilated loops of bowel noted

Vascular/Lymphatic: No signs of aortic aneurysm. Prominent
portacaval lymph node is again noted measuring 1 cm, image 46/21.

Other:  No ascites or focal fluid collections.

Musculoskeletal: No suspicious bone lesions identified.
IMPRESSION: 1. Again noted is a mass involving the body and proximal tail of
pancreas measuring 5.3 x 2.2 by 2.7 cm. Signs of involvement of the
portal venous confluence is noted. Small thrombus within the portal
venous confluence is again noted as seen on recent CT. Additionally,
appears to abut the posterior wall of the body of stomach. Cannot
rule out serosal involvement of the stomach.
2. Multiple foci of restricted diffusion with hyperenhancement noted
predominantly involving the right hepatic lobe. Most of these areas
of restricted diffusion and hyperenhancement reflect transient
hyperintensity differences (THID). Although transient hyperintensity
defects are benign they may reflect underlying liver lesions which
are too small to visualize. Management strategies include further
investigation with PET-CT versus short-term follow-up with repeat
MRI in 3 months using Eovist contrast material.
3. Focal area of restricted diffusion within the posteromedial dome
of liver with corresponding T1 and T2 signal abnormality. This is
the most suspicious area and corresponds with one of the suspicious
lesions identified on CT from [DATE].
[DATE]. Prominent portacaval lymph node is again noted. Suspicious for
local nodal metastasis.

## 2020-11-05 MED ORDER — GADOBUTROL 1 MMOL/ML IV SOLN
7.0000 mL | Freq: Once | INTRAVENOUS | Status: AC | PRN
  Administered 2020-11-05: 7 mL via INTRAVENOUS

## 2020-11-11 ENCOUNTER — Telehealth: Payer: Self-pay

## 2020-11-11 ENCOUNTER — Other Ambulatory Visit: Payer: Self-pay

## 2020-11-11 NOTE — Telephone Encounter (Signed)
Received referral for EUS from Octavia Bruckner, at [facility]. EUS scheduled for 11/14/20 with Dr. Cephas Darby at [facility], to evaluate pancreatic mass. I have spoken with Ms. Wieczorek and went over instructions and COVID testing requirement. A copy of these instructions can be located under the letters. Provided my contact information for future needs or questions.

## 2020-11-12 ENCOUNTER — Other Ambulatory Visit: Payer: Self-pay

## 2020-11-12 ENCOUNTER — Other Ambulatory Visit
Admission: RE | Admit: 2020-11-12 | Discharge: 2020-11-12 | Disposition: A | Discharge status: Home / Self Care | Payer: Medicare PPO | Source: Ambulatory Visit | Attending: Gastroenterology | Admitting: Gastroenterology

## 2020-11-12 DIAGNOSIS — U071 COVID-19: Secondary | ICD-10-CM | POA: Insufficient documentation

## 2020-11-12 DIAGNOSIS — Z01812 Encounter for preprocedural laboratory examination: Secondary | ICD-10-CM | POA: Diagnosis present

## 2020-11-12 LAB — SARS CORONAVIRUS 2 (TAT 6-24 HRS): SARS Coronavirus 2: POSITIVE — AB

## 2020-11-13 ENCOUNTER — Telehealth: Payer: Self-pay | Admitting: *Deleted

## 2020-11-13 ENCOUNTER — Telehealth: Payer: Self-pay

## 2020-11-13 NOTE — Telephone Encounter (Signed)
Called to discuss with patient about COVID-19 symptoms and the use of one of the available treatments for those with mild to moderate Covid symptoms and at a high risk of hospitalization.  Pt appears to qualify for outpatient treatment due to co-morbid conditions and/or a member of an at-risk group in accordance with the FDA Emergency Use Authorization.    Symptom onset:  Vaccinated:  Booster?  Immunocompromised?  Qualifiers:   Unable to reach pt - No answer. Left VM to return call for information.  Rhonda Day   

## 2020-11-13 NOTE — Telephone Encounter (Signed)
Notified [name] with COVID positive results. She is reporting no symptoms. Instructed to notify her PCP if any develop. Reviewed CDC guidelines for quarantinie and encouraged her spouse to get tested. Case will be presented at case conference 11-14-20. I have requested referral to medical oncology from [facility]. EUS cancelled for 11-14-20. Awaiting call back from EUS provider regarding reschedule. Updated Ms. Police on above. All questions answered.

## 2020-11-14 ENCOUNTER — Other Ambulatory Visit: Payer: Self-pay | Admitting: Oncology

## 2020-11-14 ENCOUNTER — Encounter: Admission: RE | Payer: Self-pay | Source: Home / Self Care

## 2020-11-14 ENCOUNTER — Other Ambulatory Visit: Payer: Self-pay

## 2020-11-14 ENCOUNTER — Ambulatory Visit: Admission: RE | Admit: 2020-11-14 | Payer: Medicare PPO | Source: Home / Self Care

## 2020-11-14 DIAGNOSIS — K8689 Other specified diseases of pancreas: Secondary | ICD-10-CM

## 2020-11-14 DIAGNOSIS — K769 Liver disease, unspecified: Secondary | ICD-10-CM

## 2020-11-14 SURGERY — ULTRASOUND, UPPER GI TRACT, ENDOSCOPIC
Anesthesia: General

## 2020-11-15 ENCOUNTER — Telehealth: Payer: Self-pay

## 2020-11-15 NOTE — Telephone Encounter (Signed)
Call placed to Rhonda Day to review appointments that have been arranged. We went over liver biopsy scheduled for 11/26/20. Instructions provided per AVS.   Tuesday Nov 26, 2020 9:30 AM Please arrive 60 minutes prior to appointment time. Also, please do not eat or drink anything after midnight (6-8 hours NPO) except blood pressure/heart/seizure medications. Please take with just a sip of water  [facility] has contacted her with EUS that has been scheduled for 11/27/20. They went over instructions and details. She is aware if liver biopsy is successful we will cancel this procedure.   Medical Oncology appointment arranged for 12/02/20 with Dr. Janese Banks. Went over what to expect during consult. Directions to the cancer center given.  All questions answered. Encouraged to call with any further questions or needs.

## 2020-11-22 NOTE — Progress Notes (Signed)
Patient on schedule for Liver Biopsy 11/26/2020, called and spoke with patient on phone with pre procedure instructions given. Made aware to be here @ 0830, NPO after [place] prior to procedure, and driver for discharge post procedure/recovery. Will hold am dose of metformin day of procedure. Stated understanding.

## 2020-11-25 ENCOUNTER — Other Ambulatory Visit: Payer: Self-pay | Admitting: Radiology

## 2020-11-26 ENCOUNTER — Other Ambulatory Visit: Payer: Self-pay

## 2020-11-26 ENCOUNTER — Ambulatory Visit
Admission: RE | Admit: 2020-11-26 | Discharge: 2020-11-26 | Disposition: A | Discharge status: Home / Self Care | Payer: Medicare PPO | Source: Ambulatory Visit | Attending: Oncology | Admitting: Oncology

## 2020-11-26 DIAGNOSIS — Z8616 Personal history of COVID-19: Secondary | ICD-10-CM | POA: Insufficient documentation

## 2020-11-26 DIAGNOSIS — K8689 Other specified diseases of pancreas: Secondary | ICD-10-CM | POA: Insufficient documentation

## 2020-11-26 DIAGNOSIS — E119 Type 2 diabetes mellitus without complications: Secondary | ICD-10-CM | POA: Diagnosis not present

## 2020-11-26 DIAGNOSIS — Z79899 Other long term (current) drug therapy: Secondary | ICD-10-CM | POA: Insufficient documentation

## 2020-11-26 DIAGNOSIS — Z793 Long term (current) use of hormonal contraceptives: Secondary | ICD-10-CM | POA: Insufficient documentation

## 2020-11-26 DIAGNOSIS — Z7984 Long term (current) use of oral hypoglycemic drugs: Secondary | ICD-10-CM | POA: Diagnosis not present

## 2020-11-26 DIAGNOSIS — K8020 Calculus of gallbladder without cholecystitis without obstruction: Secondary | ICD-10-CM | POA: Diagnosis not present

## 2020-11-26 DIAGNOSIS — Z538 Procedure and treatment not carried out for other reasons: Secondary | ICD-10-CM | POA: Diagnosis not present

## 2020-11-26 DIAGNOSIS — I10 Essential (primary) hypertension: Secondary | ICD-10-CM | POA: Diagnosis not present

## 2020-11-26 DIAGNOSIS — Z7982 Long term (current) use of aspirin: Secondary | ICD-10-CM | POA: Diagnosis not present

## 2020-11-26 DIAGNOSIS — Z882 Allergy status to sulfonamides status: Secondary | ICD-10-CM | POA: Insufficient documentation

## 2020-11-26 DIAGNOSIS — Z8051 Family history of malignant neoplasm of kidney: Secondary | ICD-10-CM | POA: Diagnosis not present

## 2020-11-26 DIAGNOSIS — K769 Liver disease, unspecified: Secondary | ICD-10-CM | POA: Diagnosis present

## 2020-11-26 LAB — CBC
HCT: 33.2 % — ABNORMAL LOW (ref 36.0–46.0)
Hemoglobin: 10.3 g/dL — ABNORMAL LOW (ref 12.0–15.0)
MCH: 27 pg (ref 26.0–34.0)
MCHC: 31 g/dL (ref 30.0–36.0)
MCV: 87.1 fL (ref 80.0–100.0)
Platelets: 274 10*3/uL (ref 150–400)
RBC: 3.81 MIL/uL — ABNORMAL LOW (ref 3.87–5.11)
RDW: 13.7 % (ref 11.5–15.5)
WBC: 7.6 10*3/uL (ref 4.0–10.5)
nRBC: 0 % (ref 0.0–0.2)

## 2020-11-26 LAB — PROTIME-INR
INR: 1 (ref 0.8–1.2)
Prothrombin Time: 12.7 seconds (ref 11.4–15.2)

## 2020-11-26 IMAGING — US US ABDOMEN LIMITED RUQ/ASCITES
1 series · 15 of 25 positions shown · non-contrast
Comparison: MRI of the abdomen on [DATE], CT of the abdomen on
[DATE] and abdominal ultrasound on [DATE]

CLINICAL DATA: Pancreatic mass and liver lesions. The most
suspicious liver lesion for metastatic disease is a roughly 1.1 cm
lesion at the posteromedial dome of the liver by MRI. The patient
presents for possible ultrasound-guided liver biopsy.

EXAM:
ULTRASOUND ABDOMEN LIMITED RIGHT UPPER QUADRANT

[Series 1: us biopsy · 15 of 77 slices shown]
[im 1/77]
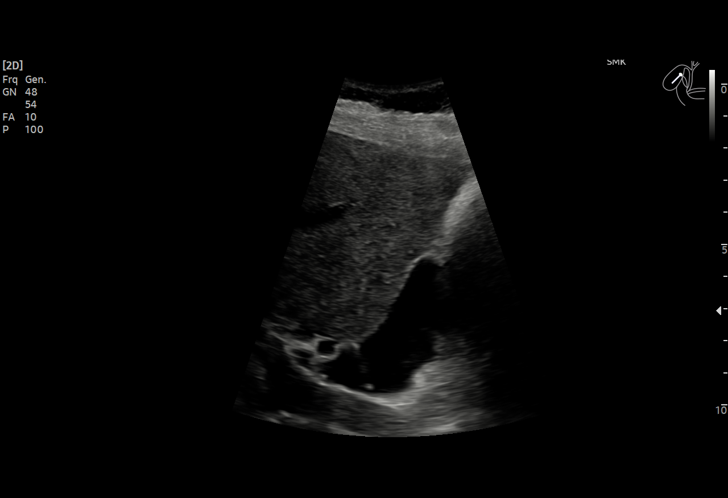
[im 7/77]
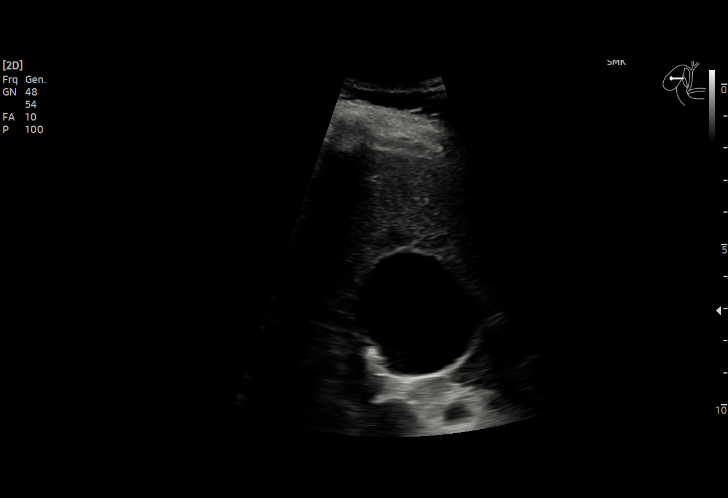
[im 13/77]
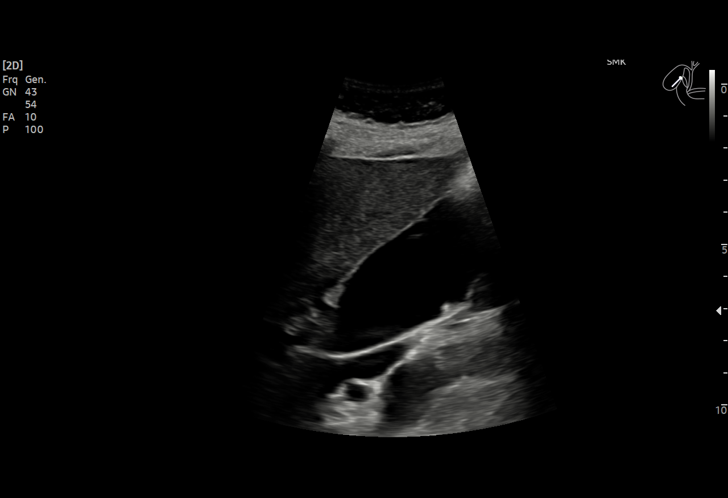
[im 16/77]
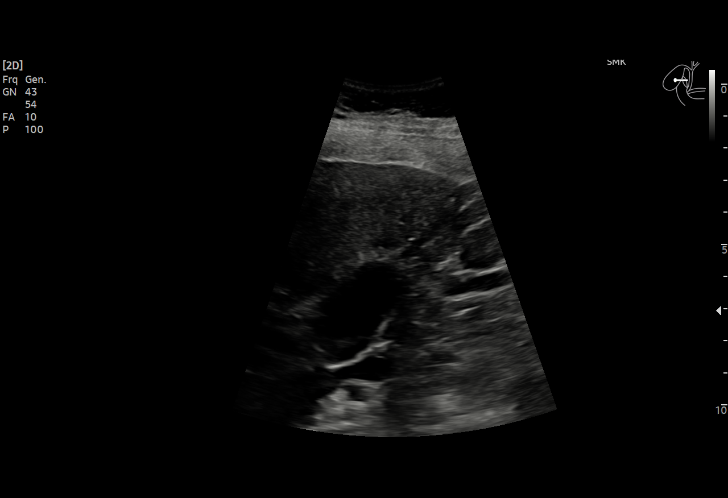
[im 23/77]
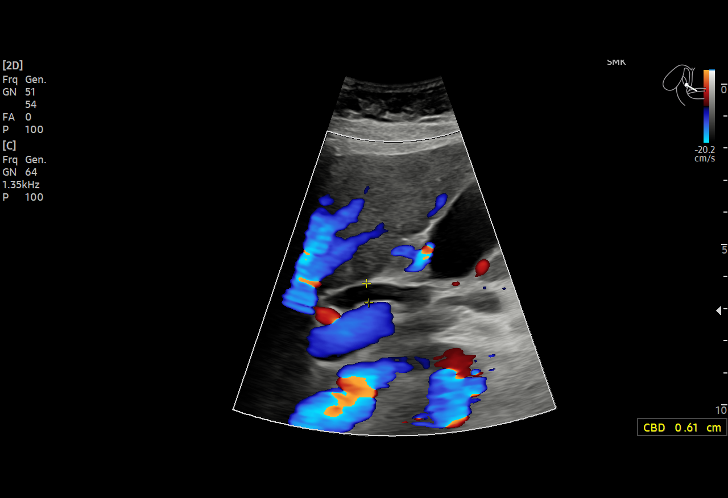
[im 29/77]
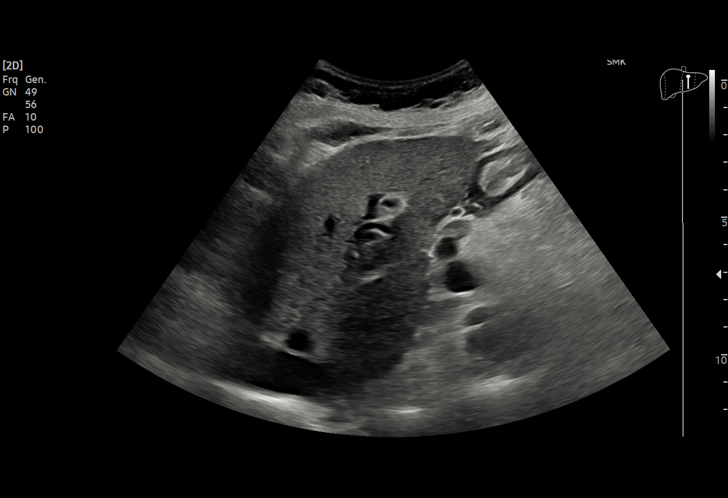
[im 32/77]
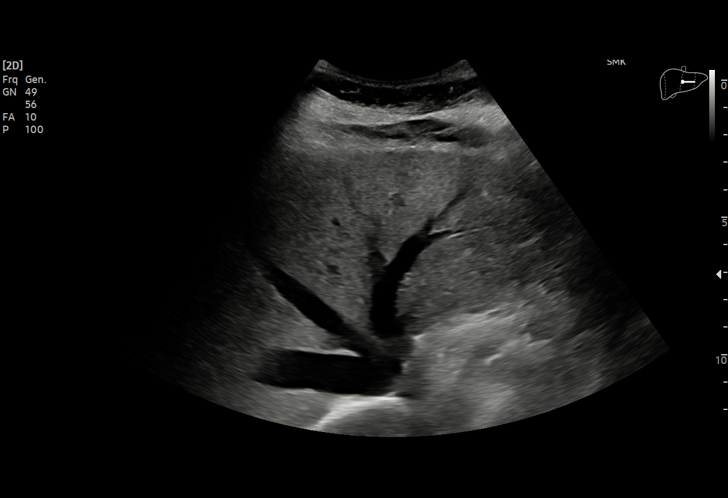
[im 39/77]
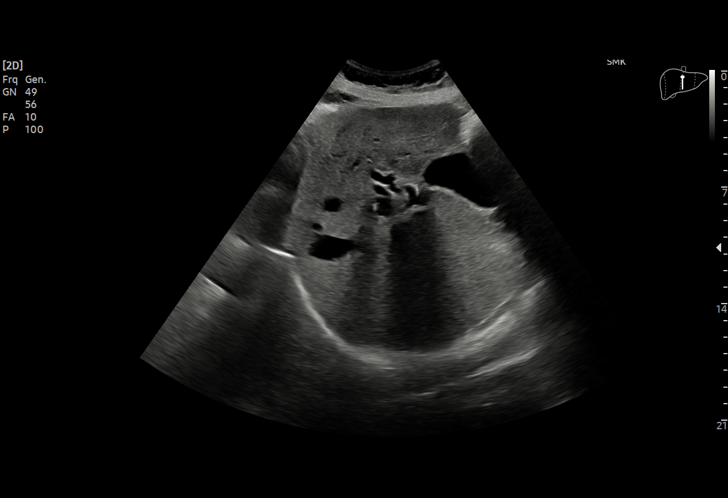
[im 45/77]
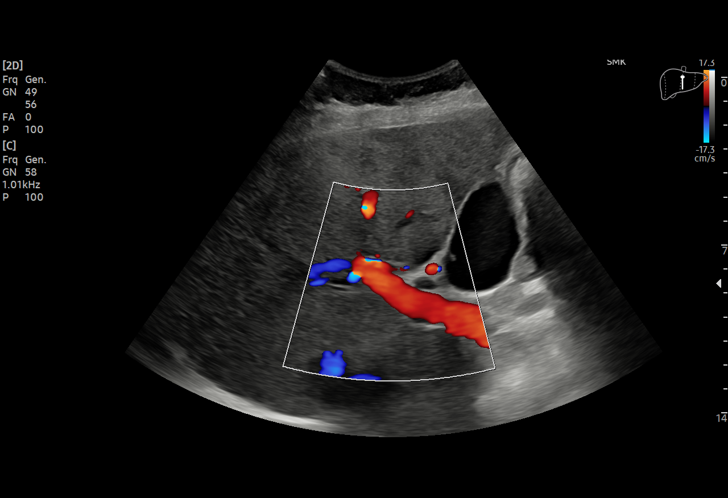
[im 48/77]
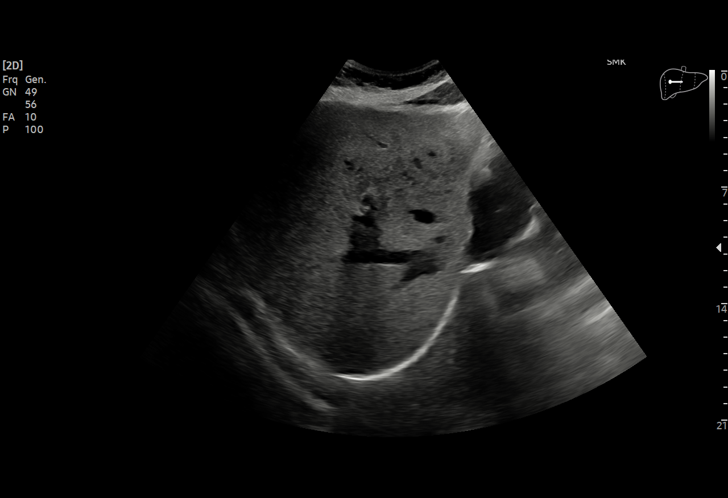
[im 54/77]
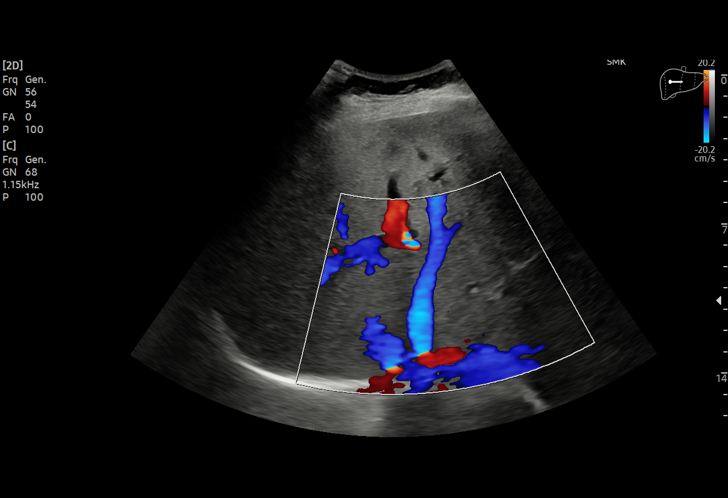
[im 61/77]
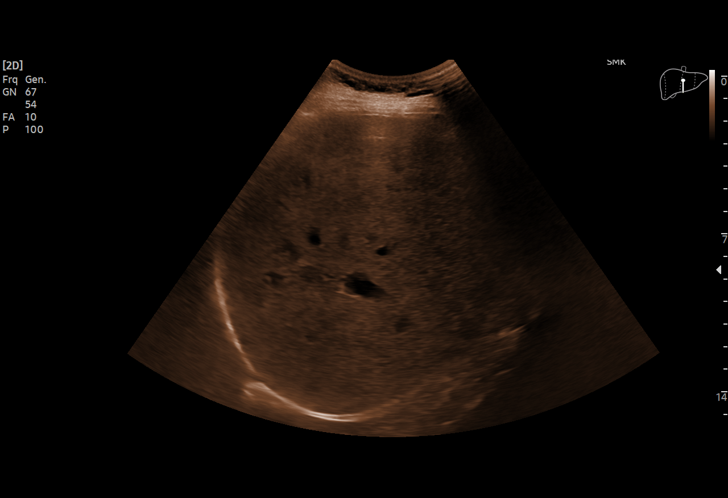
[im 64/77]
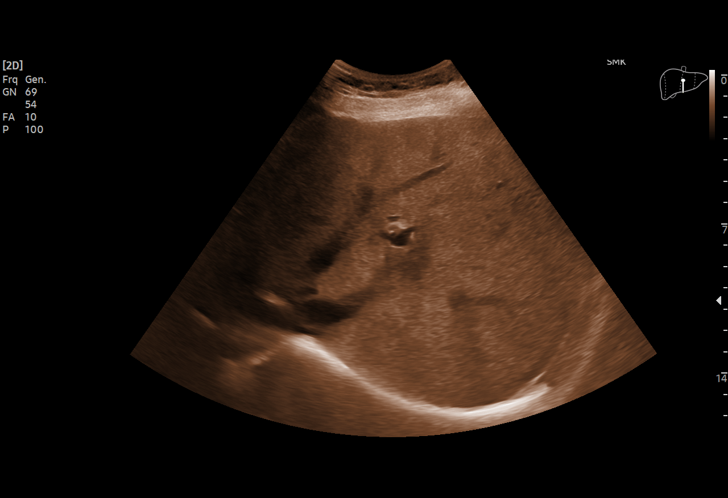
[im 70/77]
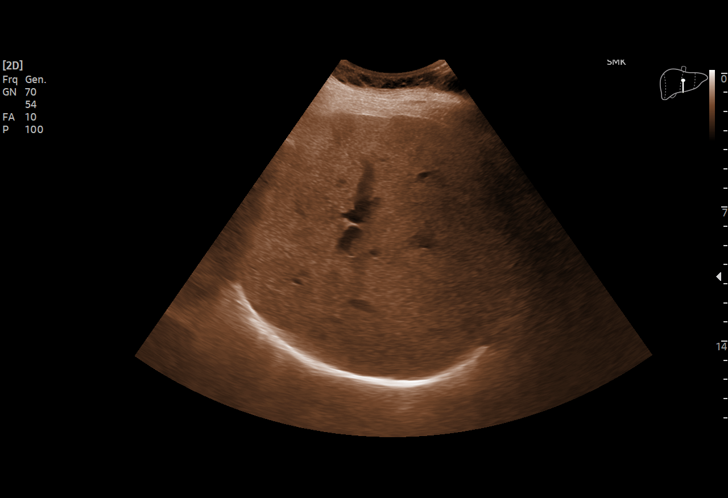
[im 77/77]
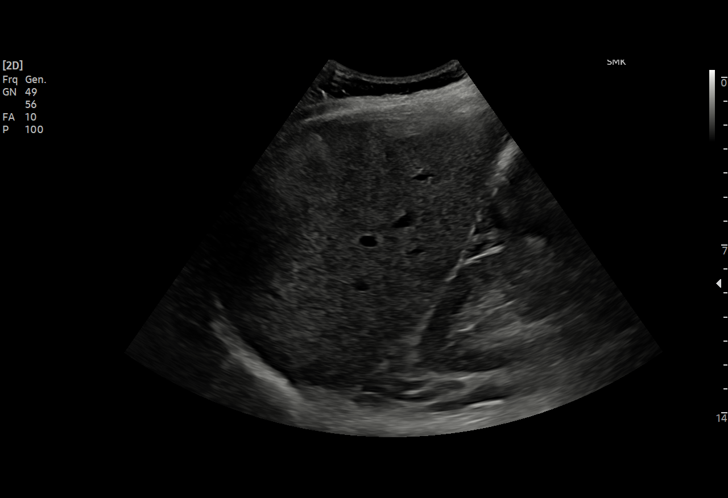

[15 of 25 positions shown; findings below may reference images not displayed]

FINDINGS: Gallbladder:

Small gallstones again visualized.

Common bile duct:

Diameter: 6 mm

Liver:

Thorough survey of the liver parenchyma by ultrasound demonstrates
only a single vaguely marginated hypoechoic lesion at the top of the
liver at the posterior dome that correlates in location to the
suspicious lesion by MRI 2. This measures approximately 13 x 9 x 13
mm. In this location, the lesion is roughly 11-12 cm deep to the
skin and several portal vein and hepatic vein branches would likely
be traversed to get to the lesion for potential biopsy. Given lesion
size, depth of lesion and increased risk of bleeding complication,
biopsy was not recommended and not performed today.
IMPRESSION: 1. The single, vaguely marginated, 13 x 9 x 13 mm hypoechoic lesion
at the posterior dome of the liver correlates in location to the
suspicious lesion by MRI. Given lesion size, depth of lesion and
increased risk of bleeding, biopsy was not recommended and not
performed today. The patient is scheduled for endoscopic ultrasound
and possible biopsy of a pancreatic mass tomorrow at [facility].
2. Stable cholelithiasis.

## 2020-11-26 MED ORDER — FENTANYL CITRATE (PF) 100 MCG/2ML IJ SOLN
INTRAMUSCULAR | Status: AC
  Filled 2020-11-26: qty 2

## 2020-11-26 MED ORDER — MIDAZOLAM HCL 2 MG/2ML IJ SOLN
INTRAMUSCULAR | Status: AC
  Filled 2020-11-26: qty 2

## 2020-11-26 MED ORDER — SODIUM CHLORIDE 0.9 % IV SOLN: INTRAVENOUS | Status: DC

## 2020-11-26 NOTE — H&P (Signed)
Chief Complaint: Patient was seen in consultation today for liver lesion biopsy   Referring Physician(s): Sindy Guadeloupe  Supervising Physician: Aletta Edouard  Patient Status: [facility] - Out-pt  History of Present Illness: Rhonda Day is a 80 y.o. 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   MRI Abdomen 11/05/20 IMPRESSION: 1. Again noted is a mass involving the body and proximal tail of pancreas measuring 5.3 x 2.2 by 2.7 cm. 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 Additionally, appears to abut the posterior wall of the body of stomach. Cannot rule out serosal involvement of the stomach. 2. Multiple foci of restricted diffusion with hyperenhancement noted predominantly involving the right hepatic lobe. Most of these areas of restricted diffusion and hyperenhancement reflect transient hyperintensity differences (THID). Although transient hyperintensity defects are benign they may reflect underlying liver lesions which are too small to visualize. Management strategies include further investigation with PET-CT versus short-term follow-up with repeat MRI in 3 months using Eovist contrast material. 3. Focal area of restricted diffusion within the posteromedial dome of liver with corresponding T1 and T2 signal abnormality. This is the most suspicious area and corresponds with one of the suspicious lesions identified on CT from 10/17/2020. 4. Prominent portacaval lymph node is again noted. Suspicious for local nodal metastasis.  Interventional Radiology has been asked to evaluate this patient for an image-guided liver lesion biopsy for further work up. This case has been reviewed and procedure approved by Dr. Anselm Pancoast.   Past Medical History:  Diagnosis Date  .  Bladder infection   . Diabetes mellitus without complication ([place])   . Hypertension     Past Surgical History:  Procedure Laterality Date  . BACK SURGERY    . TUBAL LIGATION      Allergies: Sulfa antibiotics  Medications: Prior to Admission medications   Medication Sig Start Date End Date Taking? Authorizing Provider  aspirin 120 MG suppository Place 120 mg rectally every 6 (six) hours as needed for fever.    [provider]  azelastine (OPTIVAR) 0.05 % ophthalmic solution  11/30/17   [provider]  estradiol (ESTRACE) 0.5 MG tablet Take 1 tablet (0.5 mg total) by mouth daily. 02/23/17   Gae Dry, MD  fluticasone (FLONASE) 50 MCG/ACT nasal spray SHAKE LQ AND U 1 SPR IEN QD 11/30/17   [provider]  gentamicin cream (GARAMYCIN) 0.1 % Apply 1 application topically 2 (two) times daily. 12/03/17   Edrick Kins, DPM  hydrochlorothiazide (HYDRODIURIL) 25 MG tablet Take 25 mg by mouth daily. 12/16/16   [provider]  levocetirizine (XYZAL) 5 MG tablet TK 1 T PO QD IN THE EVE 11/30/17   [provider]  losartan (COZAAR) 100 MG tablet Take 100 mg by mouth daily.    [provider]  medroxyPROGESTERone (PROVERA) 2.5 MG tablet Take 1 tablet (2.5 mg total) by mouth daily. 02/23/17   Gae Dry, MD  metFORMIN (GLUCOPHAGE-XR) 500 MG 24 hr tablet take 1 tablet by mouth once daily with DINNER 12/16/16   [provider]  montelukast (SINGULAIR) 10 MG tablet TK 1 T PO QD 11/30/17   [provider]  Omega-3 Fatty Acids (FISH OIL PO) Take by mouth.    [provider]  ONE TOUCH ULTRA TEST test strip  09/24/17   [provider]  ONETOUCH DELICA LANCETS 23J MISC 1 each by XX route as directed. Check CBG's fasting once daily. Dx: E11.9 05/12/17   [provider]  ULTRACET 37.5-325 MG tablet  11/25/16   [provider]  ULTRAM 50 MG tablet TK 2 TS PO TID PRN 11/24/17   [provider]      Family History  Problem Relation Age of Onset  . Kidney cancer Father   . Breast cancer Neg Hx     Social History   Socioeconomic History  . Marital status: Divorced    Spouse name: Not on file  . Number of children: Not on file  . Years of education: Not on file  . Highest education level: Not on file  Occupational History  . Not on file  Tobacco Use  . Smoking status: Unknown If Ever Smoked  . Smokeless tobacco: Never Used  Vaping Use  . Vaping Use: Never used  Substance and Sexual Activity  . Alcohol use: No    Alcohol/week: 0.0 standard drinks  . Drug use: No  . Sexual activity: Never  Other Topics Concern  . Not on file  Social History Narrative  . Not on file   Social Determinants of Health   Financial Resource Strain: Not on file  Food Insecurity: Not on file  Transportation Needs: Not on file  Physical Activity: Not on file  Stress: Not on file  Social Connections: Not on file    Review of Systems: A 12 point ROS discussed and pertinent positives are indicated in the HPI above.  All other systems are negative.  Review of Systems  Constitutional: Negative for appetite change and fatigue.  Respiratory: Negative for cough and shortness of breath.   Cardiovascular: Negative for chest pain and leg swelling.  Gastrointestinal: Negative for abdominal pain, diarrhea, nausea and vomiting.  Neurological: Negative for dizziness and headaches.    Vital Signs: BP (!) 184/84   Pulse 72   Temp 98.4 F (36.9 C) (Oral)   Resp 20   Ht 5\' 9"  (1.753 m)   Wt 150 lb (68 kg)   LMP  (LMP Unknown)   SpO2 97%   BMI 22.15 kg/m   Physical Exam Constitutional:      General: She is not in acute distress.    Appearance: She is not ill-appearing.  HENT:     Mouth/Throat:     Mouth: Mucous membranes are moist.     Pharynx: Oropharynx is clear.  Cardiovascular:     Rate and Rhythm: Normal rate and regular rhythm.     Pulses: Normal pulses.     Heart sounds: Normal  heart sounds.  Pulmonary:     Effort: Pulmonary effort is normal.     Breath sounds: Normal breath sounds.  Abdominal:     General: Bowel sounds are normal.     Palpations: Abdomen is soft.     Tenderness: There is no abdominal tenderness.  Skin:    General: Skin is warm and dry.  Neurological:     Mental Status: She is alert and oriented to person, place, and time.  Psychiatric:        Mood and Affect: Mood normal.        Thought Content: Thought content normal.        Judgment: Judgment normal.     Imaging: MR 3D Recon At Scanner  Result Date: 11/06/2020 CLINICAL DATA:  Pancreas neoplasm EXAM: MRI ABDOMEN WITHOUT AND  WITH CONTRAST (INCLUDING MRCP) TECHNIQUE: Multiplanar multisequence MR imaging of the abdomen was performed both before and after the administration of intravenous contrast. Heavily T2-weighted images of the biliary and pancreatic ducts were obtained, and three-dimensional MRCP images were rendered by post processing. CONTRAST:  2mL GADAVIST GADOBUTROL 1 MMOL/ML IV SOLN COMPARISON:  CT 10/17/2020 FINDINGS: Lower chest: No acute findings. Hepatobiliary: Diffuse hepatic steatosis. Multifocal areas of restricted diffusion noted predominantly involving segment 7 and segment 8 of the liver. On the arterial phase postcontrast images these areas show relative hyperintensity. The largest area of restricted diffusion is in segment 7/8 and measures 2.9 x 1.9 cm, image 33/7. On the portal venous and delayed phase images most of these areas are isointense to the liver with the exception the dominant lesion in segment 7/8. Only 1 area of restricted diffusion has a corresponding area of both increased T2 and T1 signal which is located in the posteromedial dome of liver measuring 1.1 cm, image 14/16 and image 5/8. Simple appearing cyst is noted within the posterior right hepatic lobe measuring 1.4 cm. Pancreas: Mass involving the body and proximal tail of pancreas is again noted. This measures  5.3 x 2.2 by 2.7 cm, image 23/8. Atrophy of the mid and distal tail of pancreas with dilatation of the main duct is noted as seen on recent CT. 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 Splenic vein appears completely occluded. No signs of tumor involvement of the celiac artery. The superior mesenteric artery extends posterior to the mass and appears patent. Spleen:  Within normal limits in size and appearance. Adrenals/Urinary Tract: Normal appearance of the adrenal glands no kidney mass or hydronephrosis. Right posterior kidney cyst measures 8 mm, image 25/4. Stomach/Bowel: The stomach appears nondistended. The pancreatic lesion extends up to and has contact with the posterior wall of the body of stomach, image 23/4. No signs of duodenal involvement by tumor. No abnormal dilated loops of bowel noted Vascular/Lymphatic: No signs of aortic aneurysm. Prominent portacaval lymph node is again noted measuring 1 cm, image 46/21. Other:  No ascites or focal fluid collections. Musculoskeletal: No suspicious bone lesions identified. IMPRESSION: 1. Again noted is a mass involving the body and proximal tail of pancreas measuring 5.3 x 2.2 by 2.7 cm. 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 Additionally, appears to abut the posterior wall of the body of stomach. Cannot rule out serosal involvement of the stomach. 2. Multiple foci of restricted diffusion with hyperenhancement noted predominantly involving the right hepatic lobe. Most of these areas of restricted diffusion and hyperenhancement reflect transient hyperintensity differences (THID). Although transient hyperintensity defects are benign they may reflect underlying liver lesions which are too small to visualize.  Management strategies include further investigation with PET-CT versus short-term follow-up with repeat MRI in 3 months using Eovist contrast material. 3. Focal area of restricted diffusion within the posteromedial dome of liver with corresponding T1 and T2 signal abnormality. This is the most suspicious area and corresponds with one of the suspicious lesions identified on CT from 10/17/2020. 4. Prominent portacaval lymph node is again noted. Suspicious for local nodal metastasis. Electronically Signed   By: Kerby Moors M.D.   On: 11/06/2020 09:51   MR ABDOMEN MRCP W WO CONTAST  Result Date: 11/06/2020  CLINICAL DATA:  Pancreas neoplasm EXAM: MRI ABDOMEN WITHOUT AND WITH CONTRAST (INCLUDING MRCP) TECHNIQUE: Multiplanar multisequence MR imaging of the abdomen was performed both before and after the administration of intravenous contrast. Heavily T2-weighted images of the biliary and pancreatic ducts were obtained, and three-dimensional MRCP images were rendered by post processing. CONTRAST:  55mL GADAVIST GADOBUTROL 1 MMOL/ML IV SOLN COMPARISON:  CT 10/17/2020 FINDINGS: Lower chest: No acute findings. Hepatobiliary: Diffuse hepatic steatosis. Multifocal areas of restricted diffusion noted predominantly involving segment 7 and segment 8 of the liver. On the arterial phase postcontrast images these areas show relative hyperintensity. The largest area of restricted diffusion is in segment 7/8 and measures 2.9 x 1.9 cm, image 33/7. On the portal venous and delayed phase images most of these areas are isointense to the liver with the exception the dominant lesion in segment 7/8. Only 1 area of restricted diffusion has a corresponding area of both increased T2 and T1 signal which is located in the posteromedial dome of liver measuring 1.1 cm, image 14/16 and image 5/8. Simple appearing cyst is noted within the posterior right hepatic lobe measuring 1.4 cm. Pancreas: Mass involving the body and proximal tail of  pancreas is again noted. This measures 5.3 x 2.2 by 2.7 cm, image 23/8. Atrophy of the mid and distal tail of pancreas with dilatation of the main duct is noted as seen on recent CT. 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 Splenic vein appears completely occluded. No signs of tumor involvement of the celiac artery. The superior mesenteric artery extends posterior to the mass and appears patent. Spleen:  Within normal limits in size and appearance. Adrenals/Urinary Tract: Normal appearance of the adrenal glands no kidney mass or hydronephrosis. Right posterior kidney cyst measures 8 mm, image 25/4. Stomach/Bowel: The stomach appears nondistended. The pancreatic lesion extends up to and has contact with the posterior wall of the body of stomach, image 23/4. No signs of duodenal involvement by tumor. No abnormal dilated loops of bowel noted Vascular/Lymphatic: No signs of aortic aneurysm. Prominent portacaval lymph node is again noted measuring 1 cm, image 46/21. Other:  No ascites or focal fluid collections. Musculoskeletal: No suspicious bone lesions identified. IMPRESSION: 1. Again noted is a mass involving the body and proximal tail of pancreas measuring 5.3 x 2.2 by 2.7 cm. 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 Additionally, appears to abut the posterior wall of the body of stomach. Cannot rule out serosal involvement of the stomach. 2. Multiple foci of restricted diffusion with hyperenhancement noted predominantly involving the right hepatic lobe. Most of these areas of restricted diffusion and hyperenhancement reflect transient hyperintensity differences (THID). Although transient hyperintensity defects are benign they may reflect underlying liver  lesions which are too small to visualize. Management strategies include further investigation with PET-CT versus short-term follow-up with repeat MRI in 3 months using Eovist contrast material. 3. Focal area of restricted diffusion within the posteromedial dome of liver with corresponding T1 and T2 signal abnormality. This is the most suspicious area and corresponds with one of the suspicious lesions identified on CT from 10/17/2020. 4. Prominent portacaval lymph node is again noted. Suspicious for local nodal metastasis. Electronically Signed   By: Kerby Moors M.D.   On: 11/06/2020 09:51  Labs:  CBC: No results for input(s): WBC, HGB, HCT, PLT in the last 8760 hours.  COAGS: No results for input(s): INR, APTT in the last 8760 hours.  BMP: No results for input(s): NA, K, CL, CO2, GLUCOSE, BUN, CALCIUM, CREATININE, GFRNONAA, GFRAA in the last 8760 hours.  Invalid input(s): CMP  LIVER FUNCTION TESTS: No results for input(s): BILITOT, AST, ALT, ALKPHOS, PROT, ALBUMIN in the last 8760 hours.  TUMOR MARKERS: No results for input(s): AFPTM, CEA, CA199, CHROMGRNA in the last 8760 hours.  Assessment and Plan:  Pancreatic mass; liver lesions: Blasa Raisch. [place], 80 year old female, presents today to the [facility] Interventional Radiology department for an ultrasound-guided liver lesion biopsy. The lesions are subtle by CT and MRI, they may be difficult to see with ultrasound and the patient understands a sample may not be obtainable.  Risks and benefits of this procedure were discussed with the patient and/or patient's family including, but not limited to bleeding, infection, damage to adjacent structures or low yield requiring additional tests.  All of the questions were answered and there is agreement to proceed.  She has been NPO. Vitals have been reviewed. Labs are pending but will be reviewed prior to the start of the procedure.   Consent signed and in chart.    Thank you for this interesting consult.  I greatly enjoyed meeting Lena Gores Chamberlain and look forward to participating in their care.  A copy of this report was sent to the requesting provider on this date.  Electronically Signed: Soyla Dryer, AGACNP-BC [phone] 11/26/2020, 9:03 AM   I spent a total of  30 Minutes   in face to face in clinical consultation, greater than 50% of which was counseling/coordinating care for image-guided liver lesion biopsy

## 2020-11-26 NOTE — Discharge Instructions (Signed)
Moderate Conscious Sedation, Adult Sedation is the use of medicines to promote relaxation and to relieve discomfort and anxiety. Moderate conscious sedation is a type of sedation. Under moderate conscious sedation, you are less alert than normal, but you are still able to respond to instructions, touch, or both. Moderate conscious sedation is used during short medical and dental procedures. It is milder than deep sedation, which is a type of sedation under which you cannot be easily woken up. It is also milder than general anesthesia, which is the use of medicines to make you unconscious. Moderate conscious sedation allows you to return to your regular activities sooner. Tell a health care provider about:  Any allergies you have.  All medicines you are taking, including vitamins, herbs, eye drops, creams, and over-the-counter medicines.  Any use of steroids. This includes steroids taken by mouth or as a cream.  Any problems you or family members have had with sedatives and anesthetic medicines.  Any blood disorders you have.  Any surgeries you have had.  Any medical conditions you have, such as sleep apnea.  Whether you are pregnant or may be pregnant.  Any use of cigarettes, alcohol, marijuana, or drugs. What are the risks? Generally, this is a safe procedure. However, problems may occur, including:  Getting too much medicine (oversedation).  Nausea.  Allergic reaction to medicines.  Trouble breathing. If this happens, a breathing tube may be used. It will be removed when you are awake and breathing on your own.  Heart trouble.  Lung trouble.  Confusion that gets better with time (emergence delirium). What happens before the procedure? Staying hydrated Follow instructions from your health care provider about hydration, which may include:  Up to 2 hours before the procedure - you may continue to drink clear liquids, such as water, clear fruit juice, black coffee, and plain  tea. Eating and drinking restrictions Follow instructions from your health care provider about eating and drinking, which may include:  8 hours before the procedure - stop eating heavy meals or foods, such as meat, fried foods, or fatty foods.  6 hours before the procedure - stop eating light meals or foods, such as toast or cereal.  6 hours before the procedure - stop drinking milk or drinks that contain milk.  2 hours before the procedure - stop drinking clear liquids. Medicines Ask your health care provider about:  Changing or stopping your regular medicines. This is especially important if you are taking diabetes medicines or blood thinners.  Taking medicines such as aspirin and ibuprofen. These medicines can thin your blood. Do not take these medicines unless your health care provider tells you to take them.  Taking over-the-counter medicines, vitamins, herbs, and supplements. Tests and exams  You will have a physical exam.  You may have blood tests done to show how well: ? Your kidneys and liver work. ? Your blood clots. General instructions  Plan to have a responsible adult take you home from the hospital or clinic.  If you will be going home right after the procedure, plan to have a responsible adult care for you for the time you are told. This is important. What happens during the procedure?  You will be given the sedative. The sedative may be given: ? As a pill that you will swallow. It can also be inserted into the rectum. ? As a spray through the nose. ? As an injection into the muscle. ? As an injection into the vein through an IV.    You may be given oxygen as needed.  Your breathing, heart rate, and blood pressure will be monitored during the procedure.  The medical or dental procedure will be done. The procedure may vary among health care providers and hospitals.   What happens after the procedure?  Your blood pressure, heart rate, breathing rate, and  blood oxygen level will be monitored until you leave the hospital or clinic.  You will get fluids through your IV if needed.  Do not drive or operate machinery until your health care provider says that it is safe. Summary  Sedation is the use of medicines to promote relaxation and to relieve discomfort and anxiety. Moderate conscious sedation is a type of sedation that is used during short medical and dental procedures.  Tell the health care provider about any medical conditions that you have and about all the medicines that you are taking.  You will be given the sedative as a pill, a spray through the nose, an injection into the muscle, or an injection into the vein through an IV. Vital signs are monitored during the sedation.  Moderate conscious sedation allows you to return to your regular activities sooner. This information is not intended to replace advice given to you by your health care provider. Make sure you discuss any questions you have with your health care provider. Document Revised: 02/02/2020 Document Reviewed: 08/31/2019 Elsevier Patient Education  2021 [employer] Liver Biopsy, Care After These instructions give you information on caring for yourself after your procedure. Your doctor may also give you more specific instructions. Call your doctor if you have any problems or questions after your procedure. What can I expect after the procedure? After the procedure, it is common to have:  Pain and soreness where the biopsy was done.  Bruising around the area where the biopsy was done.  Sleepiness and be tired for a few days. Follow these instructions at home: Medicines  Take over-the-counter and prescription medicines only as told by your doctor.  If you were prescribed an antibiotic medicine, take it as told by your doctor. Do not stop taking the antibiotic even if you start to feel better.  Do not take medicines such as aspirin and ibuprofen. These medicines can  thin your blood. Do not take these medicines unless your doctor tells you to take them.  If you are taking prescription pain medicine, take actions to prevent or treat constipation. Your doctor may recommend that you: ? Drink enough fluid to keep your pee (urine) clear or pale yellow. ? Take over-the-counter or prescription medicines. ? Eat foods that are high in fiber, such as fresh fruits and vegetables, whole grains, and beans. ? Limit foods that are high in fat and processed sugars, such as fried and sweet foods. Caring for your cut  Follow instructions from your doctor about how to take care of your cuts from surgery (incisions). Make sure you: ? Wash your hands with soap and water before you change your bandage (dressing). If you cannot use soap and water, use hand sanitizer. ? Change your bandage as told by your doctor. ? Leave stitches (sutures), skin glue, or skin tape (adhesive) strips in place. They may need to stay in place for 2 weeks or longer. If tape strips get loose and curl up, you may trim the loose edges. Do not remove tape strips completely unless your doctor says it is okay.  Check your cuts every day for signs of infection. Check for: ? Redness, swelling,   or more pain. ? Fluid or blood. ? Pus or a bad smell. ? Warmth.  Do not take baths, swim, or use a hot tub until your doctor says it is okay to do so. Activity  Rest at home for 1-2 days or as told by your doctor. ? Avoid sitting for a long time without moving. Get up to take short walks every 1-2 hours.  Return to your normal activities as told by your doctor. Ask what activities are safe for you.  Do not do these things in the first 24 hours: ? Drive. ? Use machinery. ? Take a bath or shower.  Do not lift more than 10 pounds (4.5 kg) or play contact sports for the first 2 weeks.   General instructions  Do not drink alcohol in the first week after the procedure.  Have someone stay with you for at least  24 hours after the procedure.  Get your test results. Ask your doctor or the department that is doing the test: ? When will my results be ready? ? How will I get my results? ? What are my treatment options? ? What other tests do I need? ? What are my next steps?  Keep all follow-up visits as told by your doctor. This is important.   Contact a doctor if:  A cut bleeds and leaves more than just a small spot of blood.  A cut is red, puffs up (swells), or hurts more than before.  Fluid or something else comes from a cut.  A cut smells bad.  You have a fever or chills. Get help right away if:  You have swelling, bloating, or pain in your belly (abdomen).  You get dizzy or faint.  You have a rash.  You feel sick to your stomach (nauseous) or throw up (vomit).  You have trouble breathing, feel short of breath, or feel faint.  Your chest hurts.  You have problems talking or seeing.  You have trouble with your balance or moving your arms or legs. Summary  After the procedure, it is common to have pain, soreness, bruising, and tiredness.  Your doctor will tell you how to take care of yourself at home. Change your bandage, take your medicines, and limit your activities as told by your doctor.  Call your doctor if you have symptoms of infection. Get help right away if your belly swells, your cut bleeds a lot, or you have trouble talking or breathing. This information is not intended to replace advice given to you by your health care provider. Make sure you discuss any questions you have with your health care provider. Document Revised: 10/14/2017 Document Reviewed: 10/15/2017 Elsevier Patient Education  2021 [employer]  

## 2020-11-26 NOTE — Progress Notes (Signed)
Interventional Radiology Progress Note  Patient brought to [place] for possible liver lesion biopsy. US shows a subtle, hypoechoic lesion at high dome of liver, far posteriorly measuring roughly 9 x 13 mm at most (mean 11 mm). Would have to cross multiple portal vein branches, hepatic vein branches and bile ducts to get to lesion and not guaranteed of adequate sample given lesion size and movement with respiration.    After discussion, I have advised against a liver biopsy given higher risks and potential reduced yield and recommended she proceed with the EUS and possible EUS biopsy of the pancreas tomorrow at [facility].  Venetia Night. Kathlene Cote, M.D Pager:  [phone]

## 2020-11-28 ENCOUNTER — Telehealth: Payer: Self-pay | Admitting: *Deleted

## 2020-11-28 NOTE — Telephone Encounter (Signed)
Patient called reporting that she was not abole to get her EUS due to her heart and spent the day in the ER trying to regulate her rate. She has not been rescheduled for EUS and has an appointment 2/14 with Dr Janese Banks. I transferred her to [employer]

## 2020-11-29 ENCOUNTER — Telehealth: Payer: Self-pay

## 2020-11-29 NOTE — Telephone Encounter (Signed)
Call returned to Rhonda Day. Received voicemail. It has been noted that she has a history of afib with RVR, dated 2017, and she is no longer taking medication or anticoagulation for this. I left her a message to keep her appointment on 12/02/20 with Dr. Janese Banks. She has also been scheduled to see cardiology on 12/09/20. I have encouraged her to call with any questions regarding her next steps. Left my contact number on her voicemail.

## 2020-11-29 NOTE — Telephone Encounter (Signed)
Spoke with Rhonda Day. She will keep her appointment with Rhonda Day on Monday. She was seen by cardiology in the ED at [facility]. She was given scripts for Cardizem CD and Eliquis. She has not stated these at this time. Per the cardiology note and her discharge summary she was to start these. She has documented instructions on holding Eliquis for procedure. She was given an appointment with a cardiologist in [place] and she preferred to be seen locally and has arranged an appointment with Rhonda Day on 12/09/20. In regards to rescheduling EUS, I have messaged GI to obtain recommendation for reschedule.

## 2020-12-02 ENCOUNTER — Telehealth: Payer: Self-pay

## 2020-12-02 ENCOUNTER — Telehealth: Payer: Self-pay | Admitting: Oncology

## 2020-12-02 ENCOUNTER — Inpatient Hospital Stay: Payer: Medicare PPO

## 2020-12-02 ENCOUNTER — Other Ambulatory Visit: Payer: Self-pay | Admitting: *Deleted

## 2020-12-02 ENCOUNTER — Encounter: Payer: Self-pay | Admitting: Oncology

## 2020-12-02 ENCOUNTER — Inpatient Hospital Stay: Payer: Medicare PPO | Attending: Oncology | Admitting: Oncology

## 2020-12-02 VITALS — BP 139/75 | HR 85 | Temp 97.7°F | Resp 18 | Wt 152.3 lb

## 2020-12-02 DIAGNOSIS — D017 Carcinoma in situ of other specified digestive organs: Secondary | ICD-10-CM | POA: Insufficient documentation

## 2020-12-02 DIAGNOSIS — Z8616 Personal history of COVID-19: Secondary | ICD-10-CM | POA: Diagnosis not present

## 2020-12-02 DIAGNOSIS — Z7901 Long term (current) use of anticoagulants: Secondary | ICD-10-CM | POA: Insufficient documentation

## 2020-12-02 DIAGNOSIS — R932 Abnormal findings on diagnostic imaging of liver and biliary tract: Secondary | ICD-10-CM | POA: Insufficient documentation

## 2020-12-02 DIAGNOSIS — R978 Other abnormal tumor markers: Secondary | ICD-10-CM | POA: Insufficient documentation

## 2020-12-02 DIAGNOSIS — Z7189 Other specified counseling: Secondary | ICD-10-CM

## 2020-12-02 DIAGNOSIS — I4891 Unspecified atrial fibrillation: Secondary | ICD-10-CM | POA: Diagnosis not present

## 2020-12-02 DIAGNOSIS — Z79899 Other long term (current) drug therapy: Secondary | ICD-10-CM | POA: Diagnosis not present

## 2020-12-02 DIAGNOSIS — Z8051 Family history of malignant neoplasm of kidney: Secondary | ICD-10-CM | POA: Diagnosis not present

## 2020-12-02 DIAGNOSIS — J309 Allergic rhinitis, unspecified: Secondary | ICD-10-CM

## 2020-12-02 DIAGNOSIS — K8689 Other specified diseases of pancreas: Secondary | ICD-10-CM

## 2020-12-02 DIAGNOSIS — C787 Secondary malignant neoplasm of liver and intrahepatic bile duct: Secondary | ICD-10-CM | POA: Diagnosis not present

## 2020-12-02 DIAGNOSIS — J300 Vasomotor rhinitis: Secondary | ICD-10-CM | POA: Insufficient documentation

## 2020-12-02 DIAGNOSIS — F419 Anxiety disorder, unspecified: Secondary | ICD-10-CM | POA: Insufficient documentation

## 2020-12-02 DIAGNOSIS — H1045 Other chronic allergic conjunctivitis: Secondary | ICD-10-CM | POA: Insufficient documentation

## 2020-12-02 HISTORY — DX: Allergic rhinitis, unspecified: J30.9

## 2020-12-02 HISTORY — DX: Vasomotor rhinitis: J30.0

## 2020-12-02 HISTORY — DX: Other chronic allergic conjunctivitis: H10.45

## 2020-12-02 LAB — CBC WITH DIFFERENTIAL/PLATELET
Abs Immature Granulocytes: 0.02 10*3/uL (ref 0.00–0.07)
Basophils Absolute: 0 10*3/uL (ref 0.0–0.1)
Basophils Relative: 0 %
Eosinophils Absolute: 0.1 10*3/uL (ref 0.0–0.5)
Eosinophils Relative: 2 %
HCT: 36.3 % (ref 36.0–46.0)
Hemoglobin: 11.4 g/dL — ABNORMAL LOW (ref 12.0–15.0)
Immature Granulocytes: 0 %
Lymphocytes Relative: 17 %
Lymphs Abs: 1.3 10*3/uL (ref 0.7–4.0)
MCH: 27.3 pg (ref 26.0–34.0)
MCHC: 31.4 g/dL (ref 30.0–36.0)
MCV: 87.1 fL (ref 80.0–100.0)
Monocytes Absolute: 0.4 10*3/uL (ref 0.1–1.0)
Monocytes Relative: 6 %
Neutro Abs: 5.7 10*3/uL (ref 1.7–7.7)
Neutrophils Relative %: 75 %
Platelets: 297 10*3/uL (ref 150–400)
RBC: 4.17 MIL/uL (ref 3.87–5.11)
RDW: 13.7 % (ref 11.5–15.5)
WBC: 7.5 10*3/uL (ref 4.0–10.5)
nRBC: 0 % (ref 0.0–0.2)

## 2020-12-02 LAB — COMPREHENSIVE METABOLIC PANEL
ALT: 27 U/L (ref 0–44)
AST: 20 U/L (ref 15–41)
Albumin: 4.4 g/dL (ref 3.5–5.0)
Alkaline Phosphatase: 73 U/L (ref 38–126)
Anion gap: 12 (ref 5–15)
BUN: 29 mg/dL — ABNORMAL HIGH (ref 8–23)
CO2: 26 mmol/L (ref 22–32)
Calcium: 9.9 mg/dL (ref 8.9–10.3)
Chloride: 99 mmol/L (ref 98–111)
Creatinine, Ser: 1.5 mg/dL — ABNORMAL HIGH (ref 0.44–1.00)
GFR, Estimated: 36 mL/min — ABNORMAL LOW (ref >60)
Glucose, Bld: 180 mg/dL — ABNORMAL HIGH (ref 70–99)
Potassium: 4.4 mmol/L (ref 3.5–5.1)
Sodium: 137 mmol/L (ref 135–145)
Total Bilirubin: 0.5 mg/dL (ref 0.3–1.2)
Total Protein: 8.4 g/dL — ABNORMAL HIGH (ref 6.5–8.1)

## 2020-12-02 MED ORDER — LORAZEPAM 0.5 MG PO TABS: 0.5000 mg | ORAL_TABLET | Freq: Three times a day (TID) | ORAL | 0 refills | Status: DC

## 2020-12-02 NOTE — Progress Notes (Signed)
Hematology/Oncology Consult note [facility] Telephone:([phone] [phone]  Patient Care Team: Dion Body, MD as PCP - General (Family Medicine) Clent Jacks, RN as Oncology Nurse Navigator   Name of the patient: Rhonda Day  [id number]  Jun 27, 1941    Reason for referral-pancreatic mass/liver lesions   Referring physician-Dr. Netty Starring  Date of visit: 12/02/20   History of presenting illness- Patient is a 80 year old female who underwent ultrasound abdomen in December 2021 which showed hypoechoic mass in the pancreatic tail measuring 2.1 x 1.8 x 4.5 cm which was not seen on prior CT scans on MRI exams a year ago.  This was followed by a CT abdomen and pelvis with contrast which showed ill-defined hypervascular lesion in segment 8 of liver measuring 1.6 x 1.4 cm concerning for metastases.  2 other lesions 1.7 x 1 cm and 1.5 x 0.8 cm were also noted.  Large mass in the body of pancreas measuring 5.6 x 2.2 x 2.9 cm.  The lesion causes obstruction of the pancreatic duct resulting in ductal dilatation throughout the tail of the pancreas.  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  Splenic vein appears chronically occluded.  Lesion also comes in contact with splenic artery which remains patent.  This was followed by MRI abdomen which again showed similar findings.  Multiple foci of restricted diffusion with hyperenhancement noted in the right hepatic lobe.  Prominent portocaval lymph node seen for local nodal metastases along with a 5.3 cm body and tail pancreatic mass  Case was discussed at tumor board and lab was liver biopsy 2 weeks after patient initial Covid test that was positive.  However at the day of the biopsy this lesion was in close association with other vascular structures and given the potential higher risks liver biopsy was  aborted and EUS was recommended.  Patient is still not had a EUS done as she has to get cardiac clearance for her uncontrolled A. fib.  She is currently on Eliquis and Cardizem.  She is also wearing a heart monitor and was cleared to undergo EUS which will be done on 12/06/2020.  She is here to discuss further management  Patient lives with her significant other.  She is independent of her ADLs and IADLs.  She reports doing well.  Appetite and weight have been stable.  Denies any abdominal pain.  She has been feeling anxious since her recent CT findings.  ECOG PS- 1  Pain scale- 0   Review of systems- Review of Systems  Psychiatric/Behavioral: The patient is nervous/anxious.     Allergies  Allergen Reactions  . Sulfa Antibiotics Rash    Other reaction(s): RASH    Patient Active Problem List   Diagnosis Date Noted  . 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 Controlled type 2 diabetes mellitus without complication ([place]) 47/42/5956  . Type 2 diabetes mellitus ([place]) 11/15/2015  . Essential (primary) hypertension 11/15/2015  . Acute urinary tract infection 09/14/2014  . Incomplete bladder emptying 11/16/2012  . 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 LBP (low back pain) 11/11/2012  . Bladder compliance low 11/11/2012  . Female genital symptoms 11/11/2012  . Symptoms involving urinary system 11/11/2012     Past Medical History:  Diagnosis Date  . Bladder infection   . Diabetes mellitus without complication ([place])   . Hypertension      Past Surgical History:  Procedure  Laterality Date  . BACK SURGERY    . TUBAL LIGATION      Social History   Socioeconomic History  . Marital status: Divorced    Spouse name: Not on file  . Number of children: Not on file  . Years of education: Not on file  . Highest education level: Not on file  Occupational History  . Not on file  Tobacco Use  . Smoking status: Unknown If Ever Smoked  . Smokeless tobacco: Never Used  Vaping Use   . Vaping Use: Never used  Substance and Sexual Activity  . Alcohol use: No    Alcohol/week: 0.0 standard drinks  . Drug use: No  . Sexual activity: Never  Other Topics Concern  . Not on file  Social History Narrative  . Not on file   Social Determinants of Health   Financial Resource Strain: Not on file  Food Insecurity: Not on file  Transportation Needs: Not on file  Physical Activity: Not on file  Stress: Not on file  Social Connections: Not on file  Intimate Partner Violence: Not on file     Family History  Problem Relation Age of Onset  . Kidney cancer Father   . Breast cancer Neg Hx      Current Outpatient Medications:  .  azelastine (OPTIVAR) 0.05 % ophthalmic solution, , Disp: , Rfl: 3 .  estradiol (ESTRACE) 0.5 MG tablet, Take 1 tablet (0.5 mg total) by mouth daily., Disp: 30 tablet, Rfl: 12 .  losartan (COZAAR) 100 MG tablet, Take 100 mg by mouth daily., Disp: , Rfl:  .  metFORMIN (GLUCOPHAGE-XR) 500 MG 24 hr tablet, take 1 tablet by mouth once daily with DINNER, Disp: , Rfl: 0 .  Omega-3 Fatty Acids (FISH OIL PO), Take by mouth., Disp: , Rfl:  .  aspirin 120 MG suppository, Place 120 mg rectally every 6 (six) hours as needed for fever., Disp: , Rfl:  .  ONE TOUCH ULTRA TEST test strip, , Disp: , Rfl: 1 .  ONETOUCH DELICA LANCETS 09T MISC, 1 each by XX route as directed. Check CBG's fasting once daily. Dx: E11.9, Disp: , Rfl:  .  ULTRACET 37.5-325 MG tablet, , Disp: , Rfl: 0 .  ULTRAM 50 MG tablet, TK 2 TS PO TID PRN, Disp: , Rfl: 0   Physical exam:  Vitals:   12/02/20 1351  BP: 139/75  Pulse: 85  Resp: 18  Temp: 97.7 F (36.5 C)  TempSrc: Tympanic  SpO2: 97%  Weight: 152 lb 4.8 oz (69.1 kg)   Physical Exam Eyes:     Extraocular Movements: EOM normal.  Cardiovascular:     Rate and Rhythm: Normal rate. Rhythm irregular.     Heart sounds: Normal heart sounds.  Pulmonary:     Effort: Pulmonary effort is normal.     Breath sounds: Normal breath  sounds.  Abdominal:     General: Bowel sounds are normal.     Palpations: Abdomen is soft.  Skin:    General: Skin is warm and dry.  Neurological:     Mental Status: She is alert and oriented to person, place, and time.        CMP Latest Ref Rng & Units 09/14/2019  Glucose 70 - 99 mg/dL 104(H)  BUN 8 - 23 mg/dL 39(H)  Creatinine 0.44 - 1.00 mg/dL 1.83(H)  Sodium 135 - 145 mmol/L 137  Potassium 3.5 - 5.1 mmol/L 3.3(L)  Chloride 98 - 111 mmol/L 101  CO2 22 -  32 mmol/L 26  Calcium 8.9 - 10.3 mg/dL 9.0   CBC Latest Ref Rng & Units 11/26/2020  WBC 4.0 - 10.5 K/uL 7.6  Hemoglobin 12.0 - 15.0 g/dL 10.3(L)  Hematocrit 36.0 - 46.0 % 33.2(L)  Platelets 150 - 400 K/uL 274    No images are attached to the encounter.  MR 3D Recon At Scanner  Result Date: 11/06/2020 CLINICAL DATA:  Pancreas neoplasm EXAM: MRI ABDOMEN WITHOUT AND WITH CONTRAST (INCLUDING MRCP) TECHNIQUE: Multiplanar multisequence MR imaging of the abdomen was performed both before and after the administration of intravenous contrast. Heavily T2-weighted images of the biliary and pancreatic ducts were obtained, and three-dimensional MRCP images were rendered by post processing. CONTRAST:  14mL GADAVIST GADOBUTROL 1 MMOL/ML IV SOLN COMPARISON:  CT 10/17/2020 FINDINGS: Lower chest: No acute findings. Hepatobiliary: Diffuse hepatic steatosis. Multifocal areas of restricted diffusion noted predominantly involving segment 7 and segment 8 of the liver. On the arterial phase postcontrast images these areas show relative hyperintensity. The largest area of restricted diffusion is in segment 7/8 and measures 2.9 x 1.9 cm, image 33/7. On the portal venous and delayed phase images most of these areas are isointense to the liver with the exception the dominant lesion in segment 7/8. Only 1 area of restricted diffusion has a corresponding area of both increased T2 and T1 signal which is located in the posteromedial dome of liver measuring 1.1 cm,  image 14/16 and image 5/8. Simple appearing cyst is noted within the posterior right hepatic lobe measuring 1.4 cm. Pancreas: Mass involving the body and proximal tail of pancreas is again noted. This measures 5.3 x 2.2 by 2.7 cm, image 23/8. Atrophy of the mid and distal tail of pancreas with dilatation of the main duct is noted as seen on recent CT. 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 Splenic vein appears completely occluded. No signs of tumor involvement of the celiac artery. The superior mesenteric artery extends posterior to the mass and appears patent. Spleen:  Within normal limits in size and appearance. Adrenals/Urinary Tract: Normal appearance of the adrenal glands no kidney mass or hydronephrosis. Right posterior kidney cyst measures 8 mm, image 25/4. Stomach/Bowel: The stomach appears nondistended. The pancreatic lesion extends up to and has contact with the posterior wall of the body of stomach, image 23/4. No signs of duodenal involvement by tumor. No abnormal dilated loops of bowel noted Vascular/Lymphatic: No signs of aortic aneurysm. Prominent portacaval lymph node is again noted measuring 1 cm, image 46/21. Other:  No ascites or focal fluid collections. Musculoskeletal: No suspicious bone lesions identified. IMPRESSION: 1. Again noted is a mass involving the body and proximal tail of pancreas measuring 5.3 x 2.2 by 2.7 cm. 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 Additionally, appears to abut the posterior wall of the body of stomach. Cannot rule out serosal involvement of the stomach. 2. Multiple foci of restricted diffusion with hyperenhancement noted predominantly involving the right hepatic lobe. Most of these areas of restricted  diffusion and hyperenhancement reflect transient hyperintensity differences (THID). Although transient hyperintensity defects are benign they may reflect underlying liver lesions which are too small to visualize. Management strategies include further investigation with PET-CT versus short-term follow-up with repeat MRI in 3 months using Eovist contrast material.  3. Focal area of restricted diffusion within the posteromedial dome of liver with corresponding T1 and T2 signal abnormality. This is the most suspicious area and corresponds with one of the suspicious lesions identified on CT from 10/17/2020. 4. Prominent portacaval lymph node is again noted. Suspicious for local nodal metastasis. Electronically Signed   By: Kerby Moors M.D.   On: 11/06/2020 09:51   MR ABDOMEN MRCP W WO CONTAST  Result Date: 11/06/2020 CLINICAL DATA:  Pancreas neoplasm EXAM: MRI ABDOMEN WITHOUT AND WITH CONTRAST (INCLUDING MRCP) TECHNIQUE: Multiplanar multisequence MR imaging of the abdomen was performed both before and after the administration of intravenous contrast. Heavily T2-weighted images of the biliary and pancreatic ducts were obtained, and three-dimensional MRCP images were rendered by post processing. CONTRAST:  59mL GADAVIST GADOBUTROL 1 MMOL/ML IV SOLN COMPARISON:  CT 10/17/2020 FINDINGS: Lower chest: No acute findings. Hepatobiliary: Diffuse hepatic steatosis. Multifocal areas of restricted diffusion noted predominantly involving segment 7 and segment 8 of the liver. On the arterial phase postcontrast images these areas show relative hyperintensity. The largest area of restricted diffusion is in segment 7/8 and measures 2.9 x 1.9 cm, image 33/7. On the portal venous and delayed phase images most of these areas are isointense to the liver with the exception the dominant lesion in segment 7/8. Only 1 area of restricted diffusion has a corresponding area of both increased T2 and T1 signal which is located in the  posteromedial dome of liver measuring 1.1 cm, image 14/16 and image 5/8. Simple appearing cyst is noted within the posterior right hepatic lobe measuring 1.4 cm. Pancreas: Mass involving the body and proximal tail of pancreas is again noted. This measures 5.3 x 2.2 by 2.7 cm, image 23/8. Atrophy of the mid and distal tail of pancreas with dilatation of the main duct is noted as seen on recent CT. 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 Splenic vein appears completely occluded. No signs of tumor involvement of the celiac artery. The superior mesenteric artery extends posterior to the mass and appears patent. Spleen:  Within normal limits in size and appearance. Adrenals/Urinary Tract: Normal appearance of the adrenal glands no kidney mass or hydronephrosis. Right posterior kidney cyst measures 8 mm, image 25/4. Stomach/Bowel: The stomach appears nondistended. The pancreatic lesion extends up to and has contact with the posterior wall of the body of stomach, image 23/4. No signs of duodenal involvement by tumor. No abnormal dilated loops of bowel noted Vascular/Lymphatic: No signs of aortic aneurysm. Prominent portacaval lymph node is again noted measuring 1 cm, image 46/21. Other:  No ascites or focal fluid collections. Musculoskeletal: No suspicious bone lesions identified. IMPRESSION: 1. Again noted is a mass involving the body and proximal tail of pancreas measuring 5.3 x 2.2 by 2.7 cm. 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 Additionally, appears to abut the posterior wall of the body of stomach. Cannot rule out serosal involvement of the stomach. 2. Multiple foci of restricted diffusion with hyperenhancement noted predominantly involving the right  hepatic lobe. Most of these areas of restricted diffusion and hyperenhancement reflect transient hyperintensity differences (THID). Although transient hyperintensity defects are benign they may reflect underlying liver lesions which are too small to visualize. Management strategies include further investigation with PET-CT versus short-term follow-up  with repeat MRI in 3 months using Eovist contrast material. 3. Focal area of restricted diffusion within the posteromedial dome of liver with corresponding T1 and T2 signal abnormality. This is the most suspicious area and corresponds with one of the suspicious lesions identified on CT from 10/17/2020. 4. Prominent portacaval lymph node is again noted. Suspicious for local nodal metastasis. Electronically Signed   By: Kerby Moors M.D.   On: 11/06/2020 09:51   US Abdomen Limited RUQ (LIVER/GB)  Result Date: 11/26/2020 CLINICAL DATA:  Pancreatic mass and liver lesions. The most suspicious liver lesion for metastatic disease is a roughly 1.1 cm lesion at the posteromedial dome of the liver by MRI. The patient presents for possible ultrasound-guided liver biopsy. EXAM: ULTRASOUND ABDOMEN LIMITED RIGHT UPPER QUADRANT COMPARISON:  MRI of the abdomen on 11/05/2020, CT of the abdomen on 10/17/2020 and abdominal ultrasound on 10/04/2020 FINDINGS: Gallbladder: Small gallstones again visualized. Common bile duct: Diameter: 6 mm Liver: Thorough survey of the liver parenchyma by ultrasound demonstrates only a single vaguely marginated hypoechoic lesion at the top of the liver at the posterior dome that correlates in location to the suspicious lesion by MRI 2. This measures approximately 13 x 9 x 13 mm. In this location, the lesion is roughly 11-12 cm deep to the skin and several portal vein and hepatic vein branches would likely be traversed to get to the lesion for potential biopsy. Given lesion size, depth of lesion and increased risk of bleeding complication, biopsy was not  recommended and not performed today. IMPRESSION: 1. The single, vaguely marginated, 13 x 9 x 13 mm hypoechoic lesion at the posterior dome of the liver correlates in location to the suspicious lesion by MRI. Given lesion size, depth of lesion and increased risk of bleeding, biopsy was not recommended and not performed today. The patient is scheduled for endoscopic ultrasound and possible biopsy of a pancreatic mass tomorrow at [facility]. 2. Stable cholelithiasis. Electronically Signed   By: Aletta Edouard M.D.   On: 11/26/2020 10:55    Assessment and plan- Patient is a 80 y.o. female with large pancreatic body/tail mass and concerning areas of possible liver metastases.  Patient's case was discussed at tumor board and the liver lesions appear suspicious for metastatic disease.  However they could not be biopsied as they were not close association with portal vein and hepatic venous branches as well as the bile ducts.  Plan for now is to proceed with an EUS guided biopsy of the pancreatic body mass after her A. fib is controlled.  Discussed with the patient that all these findings are concerning for metastatic pancreatic cancer.  CA 19-9 was 510 on 11/07/2020.  Will check CBC with differential CMP and CA 19-9 today.  I will obtain a PET CT scan to complete her staging work-up as well as to take another look at the liver.  Discussed with the patient that her case was discussed at tumor board and there continues to remain a concern for hepatic metastatic disease based on MRI findings.  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  There would be no indication for surgery in this situation.  Chemotherapy would be palliative and not curative.  Discussed that stage IV pancreatic cancer overall has poor prognosis with an overall survival of about 1 year with treatment.  Without treatment it would be less than 6 months.   However after the PET CT scan is done, if the liver findings remain  ambiguous I  will consider referral to hepatobiliary surgery at [facility] as well.  However given the large pancreatic mass and local regional adenopathy as well as splenic vein involvement she would require systemic chemotherapy to start with.   I will see her in 10 days time to discuss biopsy results as well as PET scan results and further management.  I will send her a prescription for Ativan 0.5 mg every 8 hours as needed for anxiety.  A. fib: Continue Cardizem and Eliquis.  Her heart rate today was 85 in the clinic  Thank you for this kind referral and the opportunity to participate in the care of this patient   Visit Diagnosis 1. Pancreatic mass   2. Liver metastases ([place])   3. Goals of care, counseling/discussion     Dr. Randa Evens, MD, MPH [facility] at [facility] [phone] 12/02/2020  3:14 PM

## 2020-12-02 NOTE — Telephone Encounter (Signed)
EUS has been rescheduled for 12/06/20 at 1330.

## 2020-12-02 NOTE — Progress Notes (Signed)
Met with [name] alongside Dr. Janese Banks. EUS has been rescheduled for this Friday, 12/06/20. She is currently wearing a cardiac monitor for two weeks. Se will be seeing Dr. Nehemiah Massed on 12/09/20 and he will be able to review those results with her. PET scan being authorized prior to scheduling. She will return on 12/13/20 to review pathology from biopsy and PET results. She denies any appetite changes, weight loss, or pain. She is having new onset of anxiety and Dr. Janese Banks has prescribed low dose ativan for this. Encouraged to call for any questions or support. She was also encouraged to being a support to her visits if she desires.

## 2020-12-02 NOTE — Telephone Encounter (Signed)
Spoke with pt to confirm PET scan scheduled on 2/23 at [facility]. Patient confirmed time and location. Mailing AVS.

## 2020-12-03 LAB — CA 19-9 (SERIAL): CA 19-9: 681 U/mL — ABNORMAL HIGH (ref 0–35)

## 2020-12-04 ENCOUNTER — Inpatient Hospital Stay (HOSPITAL_BASED_OUTPATIENT_CLINIC_OR_DEPARTMENT_OTHER): Payer: Medicare PPO | Admitting: Hospice and Palliative Medicine

## 2020-12-04 DIAGNOSIS — K8689 Other specified diseases of pancreas: Secondary | ICD-10-CM

## 2020-12-04 NOTE — Progress Notes (Signed)
Multidisciplinary Oncology Council Documentation  Yeny Schmoll Houde was presented by our [facility] on 12/04/2020, which included representatives from:  . Palliative Care . Dietitian  . Physical/Occupational Therapist . Nurse Navigator . Genetics . Speech Therapist . Social work . Survivorship RN . Hotel manager . Research RN . [place] Representative  Shawntia currently presents with history of stage IV pancreatic cancer  We reviewed previous medical and familial history, history of present illness, and recent lab results along with all available histopathologic and imaging studies. The [place] considered available treatment options and made the following recommendations/referrals:  Consider nutrition, genetics, and palliative care consults if clinically appropriate  The MOC is a meeting of clinicians from various specialty areas who evaluate and discuss patients for whom a multidisciplinary approach is being considered. Final determinations in the plan of care are those of the provider(s).   Today's extended care, comprehensive team conference, Janece was not present for the discussion and was not examined.

## 2020-12-09 ENCOUNTER — Encounter: Payer: Self-pay | Admitting: Oncology

## 2020-12-09 ENCOUNTER — Telehealth: Payer: Self-pay

## 2020-12-09 DIAGNOSIS — E782 Mixed hyperlipidemia: Secondary | ICD-10-CM | POA: Insufficient documentation

## 2020-12-09 DIAGNOSIS — I480 Paroxysmal atrial fibrillation: Secondary | ICD-10-CM | POA: Insufficient documentation

## 2020-12-09 DIAGNOSIS — R9431 Abnormal electrocardiogram [ECG] [EKG]: Secondary | ICD-10-CM | POA: Insufficient documentation

## 2020-12-09 HISTORY — DX: Mixed hyperlipidemia: E78.2

## 2020-12-09 HISTORY — DX: Paroxysmal atrial fibrillation: I48.0

## 2020-12-09 HISTORY — DX: Abnormal electrocardiogram (ECG) (EKG): R94.31

## 2020-12-09 NOTE — Telephone Encounter (Signed)
Copy of pathology report from EUS given to Dr. Janese Banks for review.

## 2020-12-11 ENCOUNTER — Other Ambulatory Visit: Payer: Self-pay

## 2020-12-11 ENCOUNTER — Encounter (HOSPITAL_COMMUNITY)
Admission: RE | Admit: 2020-12-11 | Discharge: 2020-12-11 | Disposition: A | Discharge status: Home / Self Care | Payer: Medicare PPO | Source: Ambulatory Visit | Attending: Oncology | Admitting: Oncology

## 2020-12-11 DIAGNOSIS — C787 Secondary malignant neoplasm of liver and intrahepatic bile duct: Secondary | ICD-10-CM | POA: Insufficient documentation

## 2020-12-11 DIAGNOSIS — K8689 Other specified diseases of pancreas: Secondary | ICD-10-CM | POA: Insufficient documentation

## 2020-12-11 LAB — GLUCOSE, CAPILLARY: Glucose-Capillary: 184 mg/dL — ABNORMAL HIGH (ref 70–99)

## 2020-12-11 IMAGING — PT NM PET TUM IMG INITIAL (PI) SKULL BASE T - THIGH
8 series · 25 of 25 positions shown · non-contrast
Comparison: MRI [DATE]

CLINICAL DATA: Initial treatment strategy for pancreatic cancer.

EXAM:
NUCLEAR MEDICINE PET SKULL BASE TO THIGH
TECHNIQUE: 8.3 mCi F-18 FDG was injected intravenously. Full-ring PET imaging
was performed from the skull base to thigh after the radiotracer. CT
data was obtained and used for attenuation correction and anatomic
localization.
Fasting blood glucose: 184 mg/dl

[Series 3: pet sk_thigh ac · axial · 5.0mm · 4.07mm/px · z∈[-937,-25]mm · 5 of 229 slices shown]
[im 1/229]
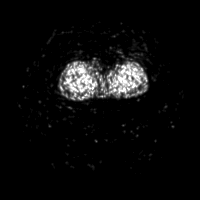
[im 58/229]
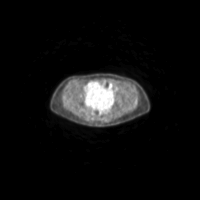
[im 115/229]
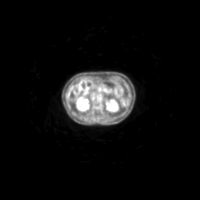
[im 172/229]
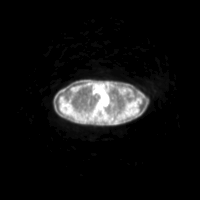
[im 229/229]
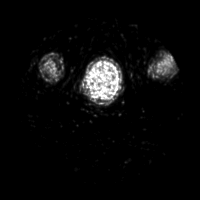

[Series 4: ct sk_thigh 5.0 bf37 · axial · 5.0mm · 0.98mm/px · z∈[-937,-25]mm · 5 of 229 slices shown]
[im 1/229]
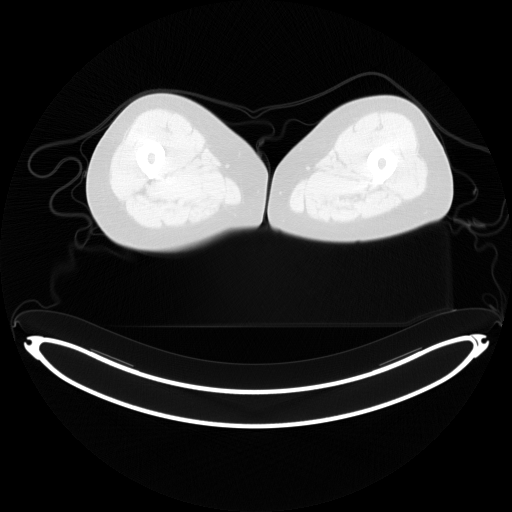
[im 58/229]
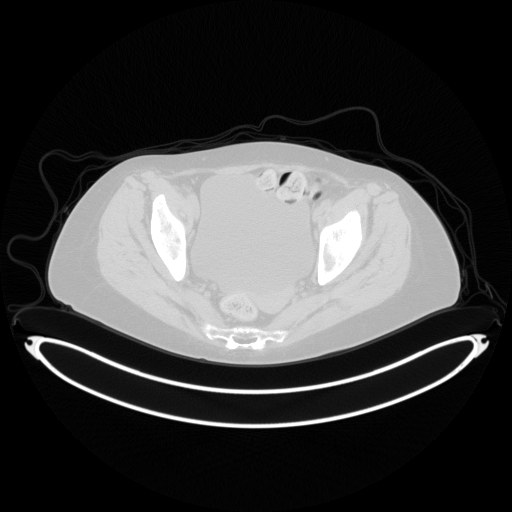
[im 115/229]
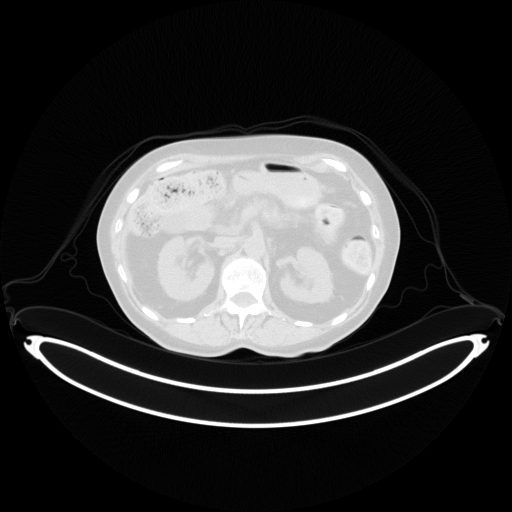
[im 172/229]
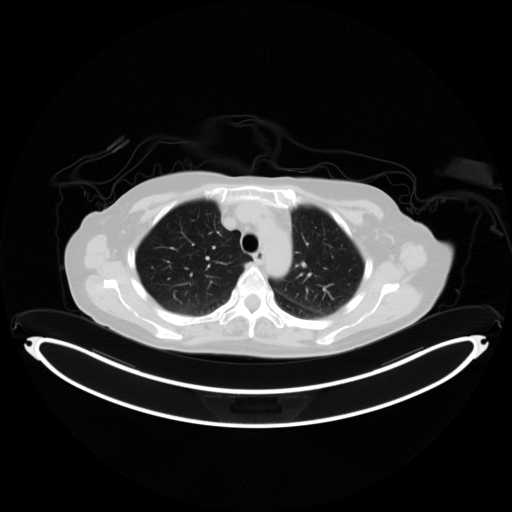
[im 229/229  brain]
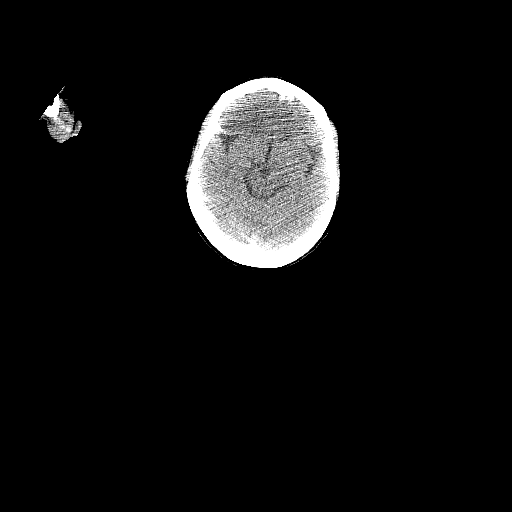

[Series 5: pet sk_thigh nac · axial · 5.0mm · 4.07mm/px · z∈[-937,-25]mm · 5 of 229 slices shown]
[im 1/229]
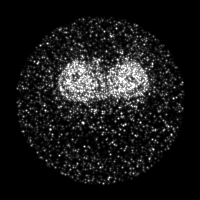
[im 58/229]
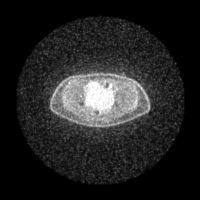
[im 115/229]
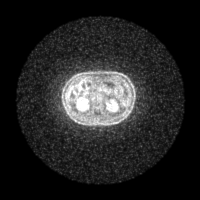
[im 172/229]
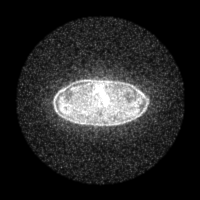
[im 229/229]
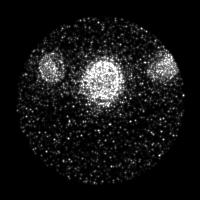

[Series 8: ct sk_thigh 5.0 br59 lung_bone · axial · 5.0mm · 0.62mm/px · z∈[-435,-179]mm · 2 of 65 slices shown]
[im 1/65]
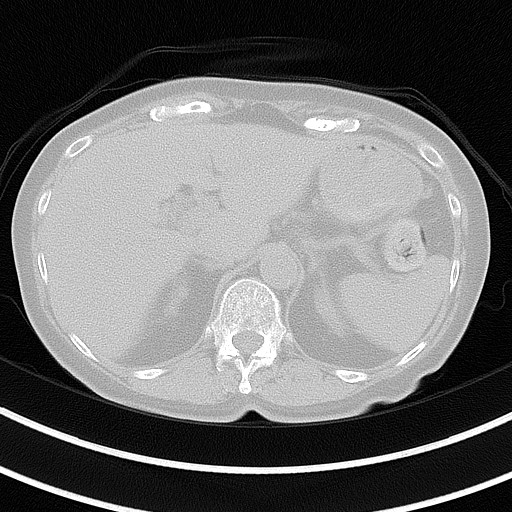
[im 65/65]
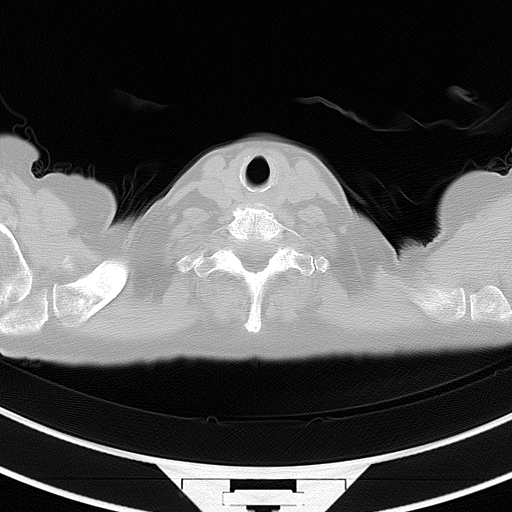

[Series 603: fused cor · 1 of 40 slices shown]
[im 1/40]
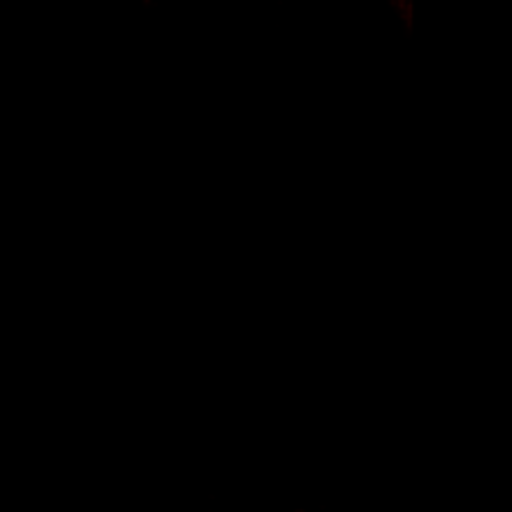

[Series 604: <mip collection> · coronal · 1.89mm/px · 1 of 32 slices shown]
[im 1/32]
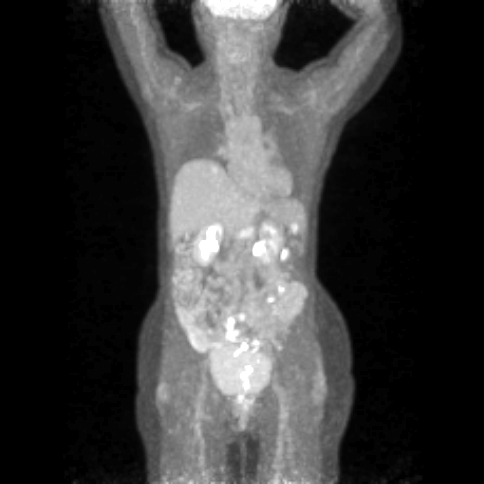

[Series 605: range-ct sk_thigh 5.0 bf37-tra-<alpha range> · 5 of 220 slices shown]
[im 1/220]
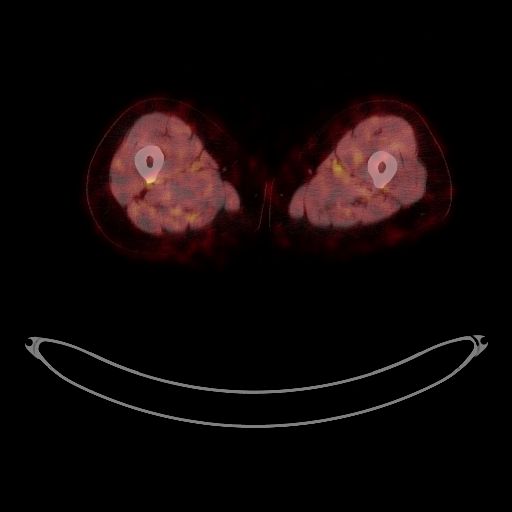
[im 55/220]
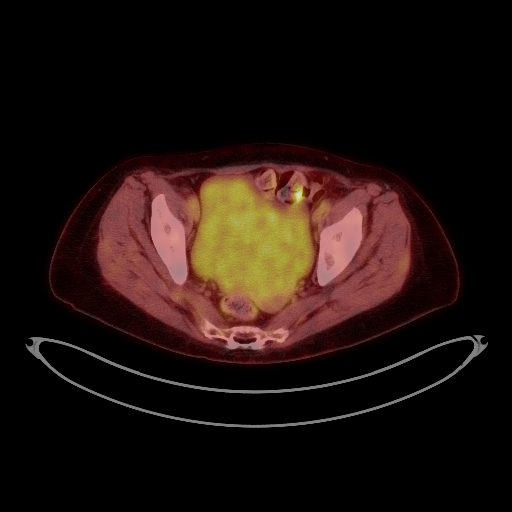
[im 110/220]
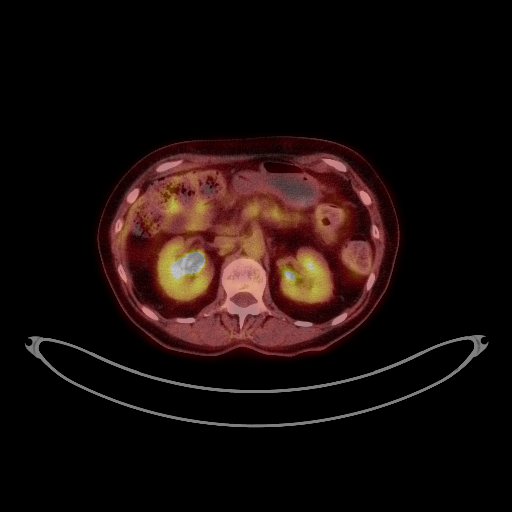
[im 165/220]
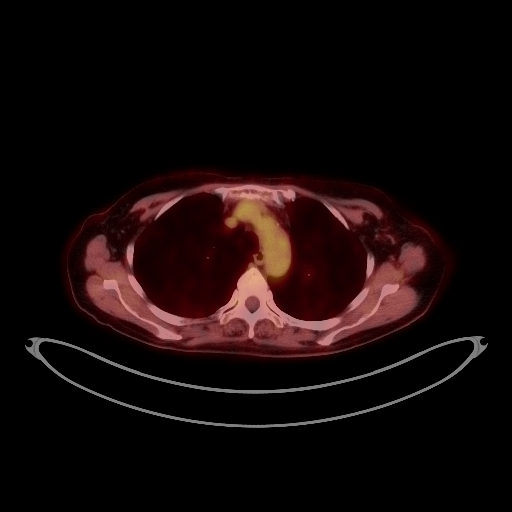
[im 220/220]
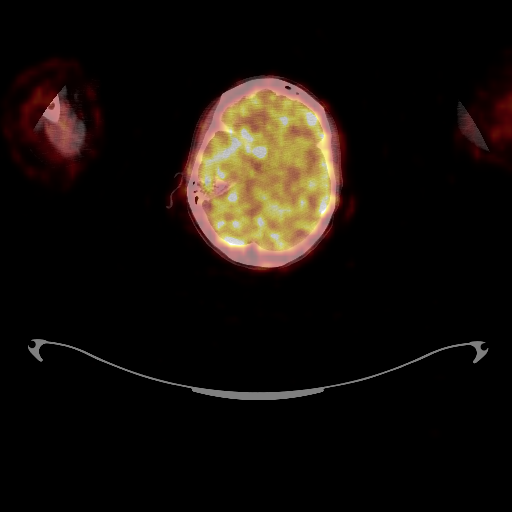

[Series 1056: results mm oncology reading · 1.0mm · 1.00mm/px · 1 of 4 slices shown]
[im 1/4]
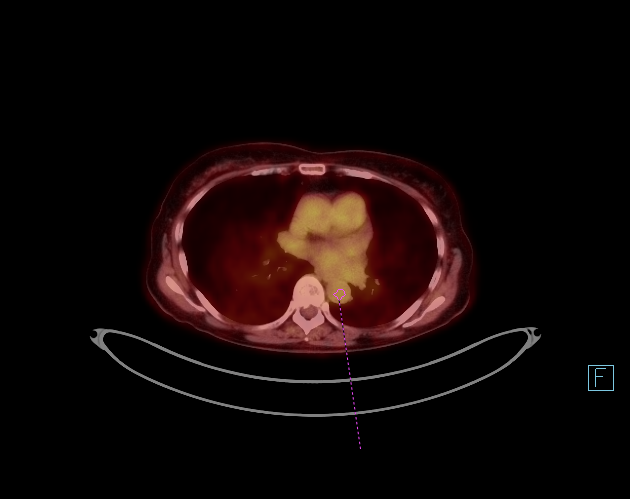

[25 of 25 positions shown; findings below may reference images not displayed]

FINDINGS: Mediastinal blood pool activity: SUV max

Liver activity: SUV max

NECK: No hypermetabolic lymph nodes in the neck.

Incidental CT findings: none

CHEST: No hypermetabolic mediastinal or hilar nodes. No suspicious
pulmonary nodules on the CT scan.

Incidental CT findings: none

ABDOMEN/PELVIS: There is a hypermetabolic lesion in the medial
aspect of the dome of the liver (image 81) with SUV max equal 4.2.
There is a subtle hypodensity on the comparison CT measuring 9 mm at
this level . No clear additional focal hypermetabolic lesions
identified.

The hypermetabolic lesion in the dome liver corresponds to enhancing
lesion on comparison MRI.

Hypermetabolic lesion in the mid body of the pancreas with SUV max
equal 5.1. Lesion measures 3.4 by 2.[address] cm on [address] portion exam

No hypermetabolic peripancreatic lymph nodes are identified. No
hypermetabolic periportal nodes.

Incidental CT findings: Nonobstructing LEFT renal calculus. Moderate
volume stool throughout the colon. Tubal ligation clips noted.

SKELETON: No focal hypermetabolic activity to suggest skeletal
metastasis.

Incidental CT findings: none
IMPRESSION: 1. Hypermetabolic mass in the mid body the pancreas consistent
primary pancreatic adenocarcinoma.
2. Hypermetabolic lesion in the dome the RIGHT hepatic lobe with
associated low-attenuation lesion on CT consistent with hepatic
metastasis. This region demonstrated enhancement on comparison MRI.
3. No hypermetabolic periportal or peripancreatic lymph nodes.

## 2020-12-11 MED ORDER — FLUDEOXYGLUCOSE F - 18 (FDG) INJECTION
6.0000 | Freq: Once | INTRAVENOUS | Status: AC | PRN
  Administered 2020-12-11: 8.32 via INTRAVENOUS

## 2020-12-13 ENCOUNTER — Encounter: Payer: Self-pay | Admitting: Oncology

## 2020-12-13 ENCOUNTER — Inpatient Hospital Stay (HOSPITAL_BASED_OUTPATIENT_CLINIC_OR_DEPARTMENT_OTHER): Payer: Medicare PPO | Admitting: Oncology

## 2020-12-13 VITALS — BP 138/68 | HR 88 | Temp 98.0°F | Resp 20 | Wt 152.7 lb

## 2020-12-13 DIAGNOSIS — C787 Secondary malignant neoplasm of liver and intrahepatic bile duct: Secondary | ICD-10-CM

## 2020-12-13 DIAGNOSIS — Z7189 Other specified counseling: Secondary | ICD-10-CM

## 2020-12-13 DIAGNOSIS — D017 Carcinoma in situ of other specified digestive organs: Secondary | ICD-10-CM | POA: Diagnosis not present

## 2020-12-13 DIAGNOSIS — C259 Malignant neoplasm of pancreas, unspecified: Secondary | ICD-10-CM | POA: Insufficient documentation

## 2020-12-13 HISTORY — DX: Malignant neoplasm of pancreas, unspecified: C25.9

## 2020-12-13 HISTORY — DX: Secondary malignant neoplasm of liver and intrahepatic bile duct: C78.7

## 2020-12-13 HISTORY — DX: Other specified counseling: Z71.89

## 2020-12-13 MED ORDER — ONDANSETRON HCL 8 MG PO TABS: 8.0000 mg | ORAL_TABLET | Freq: Two times a day (BID) | ORAL | 1 refills | Status: DC | PRN

## 2020-12-13 MED ORDER — LOPERAMIDE HCL 2 MG PO TABS: ORAL_TABLET | ORAL | 1 refills | Status: DC

## 2020-12-13 MED ORDER — DEXAMETHASONE 4 MG PO TABS: 8.0000 mg | ORAL_TABLET | Freq: Every day | ORAL | 5 refills | Status: DC

## 2020-12-13 MED ORDER — LIDOCAINE-PRILOCAINE 2.5-2.5 % EX CREA: TOPICAL_CREAM | CUTANEOUS | 3 refills | Status: DC

## 2020-12-13 MED ORDER — PROCHLORPERAZINE MALEATE 10 MG PO TABS: 10.0000 mg | ORAL_TABLET | Freq: Four times a day (QID) | ORAL | 1 refills | Status: DC | PRN

## 2020-12-13 NOTE — Progress Notes (Signed)
START ON PATHWAY REGIMEN - Pancreatic Adenocarcinoma     A cycle is every 14 days:     Oxaliplatin      Leucovorin      Irinotecan      Fluorouracil   **Always confirm dose/schedule in your pharmacy ordering system**  Patient Characteristics: Metastatic Disease, First Line, PS = 0,1, BRCA1/2 and PALB2  Mutation Absent/Unknown Therapeutic Status: Metastatic Disease Line of Therapy: First Line ECOG Performance Status: 1 BRCA1/2 Mutation Status: Awaiting Test Results PALB2 Mutation Status: Awaiting Test Results Intent of Therapy: Non-Curative / Palliative Intent, Discussed with Patient 

## 2020-12-13 NOTE — H&P (View-Only) (Signed)
Hematology/Oncology Consult note [facility]  [phone] [phone]  Patient Care Team: Dion Body, MD as PCP - General (Family Medicine) Clent Jacks, RN as Oncology Nurse Navigator   Name of the patient: Rhonda Day  [id number]  1941-02-04   Date of visit: 12/13/20  Diagnosis-metastatic pancreatic cancer with liver metastases  Chief complaint/ Reason for visit-discuss pathology results and further management  Heme/Onc history: Patient is a 80 year old female who underwent ultrasound abdomen in December 2021 which showed hypoechoic mass in the pancreatic tail measuring 2.1 x 1.8 x 4.5 cm which was not seen on prior CT scans on MRI exams a year ago.  This was followed by a CT abdomen and pelvis with contrast which showed ill-defined hypervascular lesion in segment 8 of liver measuring 1.6 x 1.4 cm concerning for metastases.  2 other lesions 1.7 x 1 cm and 1.5 x 0.8 cm were also noted.  Large mass in the body of pancreas measuring 5.6 x 2.2 x 2.9 cm.  The lesion causes obstruction of the pancreatic duct resulting in ductal dilatation throughout the tail of the pancreas.  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  Splenic vein appears chronically occluded.  Lesion also comes in contact with splenic artery which remains patent.  This was followed by MRI abdomen which again showed similar findings.  Multiple foci of restricted diffusion with hyperenhancement noted in the right hepatic lobe.  Prominent portocaval lymph node seen for local nodal metastases along with a 5.3 cm body and tail pancreatic mass  Case was discussed at tumor board and lab was liver biopsy 2 weeks after patient initial Covid test that was positive.  However at the day of the biopsy this lesion was in close association with other vascular structures and given  the potential higher risks liver biopsy was aborted and EUS was recommended.  Patient is still not had a EUS done as she has to get cardiac clearance for her uncontrolled A. fib.  She is currently on Eliquis and Cardizem.    Patient had a EUS at [facility] By Dr. Mont Dutton. Pathology was consistent with adenocarcinoma. Patient had a PET scan on 12/11/2020 which showed a hypermetabolic lesion in the medial aspect of the liver with an SUV of 4.2 there is a subtle hypodensity on the comparison CT measuring 9 mm at this level. The hypermetabolic lesion in the dome of the liver corresponds to the enhancing lesion on the comparison MRI. 3.4 x 2.7 cm lesion in the mid body of pancreas with an SUV of 5.1. No hypermetabolic peripancreatic lymph nodes.   Interval history-patient reports feeling anxious.  Denies any pain.  Appetite and weight have remained stable.  ECOG PS- 1 Pain scale- 0 Opioid associated constipation- no  Review of systems- Review of Systems  Constitutional: Positive for malaise/fatigue. Negative for chills, fever and weight loss.  HENT: Negative for congestion, ear discharge and nosebleeds.   Eyes: Negative for blurred vision.  Respiratory: Negative for cough, hemoptysis, sputum production, shortness of breath and wheezing.   Cardiovascular: Negative for chest pain, palpitations, orthopnea and claudication.  Gastrointestinal: Negative for abdominal pain, blood in stool, constipation, diarrhea, heartburn, melena, nausea and vomiting.  Genitourinary: Negative for dysuria, flank pain, frequency, hematuria and urgency.  Musculoskeletal: Negative for back pain, joint pain and myalgias.  Skin: Negative for rash.  Neurological: Negative for dizziness, tingling,  focal weakness, seizures, weakness and headaches.  Endo/Heme/Allergies: Does not bruise/bleed easily.  Psychiatric/Behavioral: Negative for depression and suicidal ideas. The patient is nervous/anxious. The patient does not have insomnia.        Allergies  Allergen Reactions  . Sulfa Antibiotics Rash    Other reaction(s): RASH     Past Medical History:  Diagnosis Date  . Bladder infection   . Diabetes mellitus without complication ([place])   . Hypertension      Past Surgical History:  Procedure Laterality Date  . BACK SURGERY    . TUBAL LIGATION      Social History   Socioeconomic History  . Marital status: Divorced    Spouse name: Not on file  . Number of children: Not on file  . Years of education: Not on file  . Highest education level: Not on file  Occupational History  . Not on file  Tobacco Use  . Smoking status: Unknown If Ever Smoked  . Smokeless tobacco: Never Used  Vaping Use  . Vaping Use: Never used  Substance and Sexual Activity  . Alcohol use: No    Alcohol/week: 0.0 standard drinks  . Drug use: No  . Sexual activity: Never  Other Topics Concern  . Not on file  Social History Narrative  . Not on file   Social Determinants of Health   Financial Resource Strain: Not on file  Food Insecurity: Not on file  Transportation Needs: Not on file  Physical Activity: Not on file  Stress: Not on file  Social Connections: Not on file  Intimate Partner Violence: Not on file    Family History  Problem Relation Age of Onset  . Kidney cancer Father   . Breast cancer Neg Hx      Current Outpatient Medications:  .  apixaban (ELIQUIS) 5 MG TABS tablet, Take by mouth., Disp: , Rfl:  .  ascorbic acid (VITAMIN C) 1000 MG tablet, Take by mouth., Disp: , Rfl:  .  azelastine (ASTELIN) 0.1 % nasal spray, azelastine 137 mcg (0.1 %) nasal spray aerosol, Disp: , Rfl:  .  azelastine (OPTIVAR) 0.05 % ophthalmic solution, , Disp: , Rfl: 3 .  b complex vitamins capsule, Take 1 capsule by mouth daily., Disp: , Rfl:  .  beta carotene 10000 UNIT capsule, Take by mouth., Disp: , Rfl:  .  diltiazem (TIAZAC) 180 MG 24 hr capsule, Take by mouth., Disp: , Rfl:  .  estradiol (ESTRACE) 0.5 MG tablet, Take 1  tablet (0.5 mg total) by mouth daily., Disp: 30 tablet, Rfl: 12 .  ferrous sulfate 325 (65 FE) MG EC tablet, Take by mouth., Disp: , Rfl:  .  ipratropium (ATROVENT) 0.06 % nasal spray, , Disp: , Rfl:  .  LORazepam (ATIVAN) 0.5 MG tablet, Take 1 tablet (0.5 mg total) by mouth every 8 (eight) hours., Disp: 60 tablet, Rfl: 0 .  losartan (COZAAR) 100 MG tablet, Take 100 mg by mouth daily., Disp: , Rfl:  .  metFORMIN (GLUCOPHAGE-XR) 500 MG 24 hr tablet, take 1 tablet by mouth once daily with DINNER, Disp: , Rfl: 0 .  Omega-3 Fatty Acids (FISH OIL PO), Take by mouth., Disp: , Rfl:  .  ONE TOUCH ULTRA TEST test strip, , Disp: , Rfl: 1 .  ONETOUCH DELICA LANCETS 00T MISC, 1 each by XX route as directed. Check CBG's fasting once daily. Dx: E11.9, Disp: , Rfl:  .  ULTRACET 37.5-325 MG tablet, , Disp: , Rfl: 0 .  ULTRAM 50 MG tablet, TK 2 TS PO TID PRN, Disp: , Rfl: 0  Physical exam:  Vitals:   12/13/20 1341  BP: 138/68  Pulse: 88  Resp: 20  Temp: 98 F (36.7 C)  TempSrc: Tympanic  SpO2: 95%  Weight: 152 lb 11.2 oz (69.3 kg)   Physical Exam Eyes:     Extraocular Movements: EOM normal.  Cardiovascular:     Rate and Rhythm: Normal rate.  Pulmonary:     Effort: Pulmonary effort is normal.  Skin:    General: Skin is warm and dry.  Neurological:     Mental Status: She is alert and oriented to person, place, and time.      CMP Latest Ref Rng & Units 12/02/2020  Glucose 70 - 99 mg/dL 180(H)  BUN 8 - 23 mg/dL 29(H)  Creatinine 0.44 - 1.00 mg/dL 1.50(H)  Sodium 135 - 145 mmol/L 137  Potassium 3.5 - 5.1 mmol/L 4.4  Chloride 98 - 111 mmol/L 99  CO2 22 - 32 mmol/L 26  Calcium 8.9 - 10.3 mg/dL 9.9  Total Protein 6.5 - 8.1 g/dL 8.4(H)  Total Bilirubin 0.3 - 1.2 mg/dL 0.5  Alkaline Phos 38 - 126 U/L 73  AST 15 - 41 U/L 20  ALT 0 - 44 U/L 27   CBC Latest Ref Rng & Units 12/02/2020  WBC 4.0 - 10.5 K/uL 7.5  Hemoglobin 12.0 - 15.0 g/dL 11.4(L)  Hematocrit 36.0 - 46.0 % 36.3  Platelets 150 -  400 K/uL 297    No images are attached to the encounter.  NM PET Image Initial (PI) Skull Base To Thigh  Result Date: 12/11/2020 CLINICAL DATA:  Initial treatment strategy for pancreatic cancer. EXAM: NUCLEAR MEDICINE PET SKULL BASE TO THIGH TECHNIQUE: 8.3 mCi F-18 FDG was injected intravenously. Full-ring PET imaging was performed from the skull base to thigh after the radiotracer. CT data was obtained and used for attenuation correction and anatomic localization. Fasting blood glucose: 184 mg/dl COMPARISON:  MRI 11/05/2018 FINDINGS: Mediastinal blood pool activity: SUV max 2.8 Liver activity: SUV max 3.3 NECK: No hypermetabolic lymph nodes in the neck. Incidental CT findings: none CHEST: No hypermetabolic mediastinal or hilar nodes. No suspicious pulmonary nodules on the CT scan. Incidental CT findings: none ABDOMEN/PELVIS: There is a hypermetabolic lesion in the medial aspect of the dome of the liver (image 81) with SUV max equal 4.2. There is a subtle hypodensity on the comparison CT measuring 9 mm at this level . No clear additional focal hypermetabolic lesions identified. The hypermetabolic lesion in the dome liver corresponds to enhancing lesion on comparison MRI. Hypermetabolic lesion in the mid body of the pancreas with SUV max equal 5.1. Lesion measures 3.4 by 2.7 cm on CT portion exam No hypermetabolic peripancreatic lymph nodes are identified. No hypermetabolic periportal nodes. Incidental CT findings: Nonobstructing LEFT renal calculus. Moderate volume stool throughout the colon. Tubal ligation clips noted. SKELETON: No focal hypermetabolic activity to suggest skeletal metastasis. Incidental CT findings: none IMPRESSION: 1. Hypermetabolic mass in the mid body the pancreas consistent primary pancreatic adenocarcinoma. 2. Hypermetabolic lesion in the dome the RIGHT hepatic lobe with associated low-attenuation lesion on CT consistent with hepatic metastasis. This region demonstrated enhancement on  comparison MRI. 3. No hypermetabolic periportal or peripancreatic lymph nodes. Electronically Signed   By: Suzy Bouchard M.D.   On: 12/11/2020 17:42   US Abdomen Limited RUQ (LIVER/GB)  Result Date: 11/26/2020 CLINICAL DATA:  Pancreatic mass and liver lesions. The most suspicious liver  lesion for metastatic disease is a roughly 1.1 cm lesion at the posteromedial dome of the liver by MRI. The patient presents for possible ultrasound-guided liver biopsy. EXAM: ULTRASOUND ABDOMEN LIMITED RIGHT UPPER QUADRANT COMPARISON:  MRI of the abdomen on 11/05/2020, CT of the abdomen on 10/17/2020 and abdominal ultrasound on 10/04/2020 FINDINGS: Gallbladder: Small gallstones again visualized. Common bile duct: Diameter: 6 mm Liver: Thorough survey of the liver parenchyma by ultrasound demonstrates only a single vaguely marginated hypoechoic lesion at the top of the liver at the posterior dome that correlates in location to the suspicious lesion by MRI 2. This measures approximately 13 x 9 x 13 mm. In this location, the lesion is roughly 11-12 cm deep to the skin and several portal vein and hepatic vein branches would likely be traversed to get to the lesion for potential biopsy. Given lesion size, depth of lesion and increased risk of bleeding complication, biopsy was not recommended and not performed today. IMPRESSION: 1. The single, vaguely marginated, 13 x 9 x 13 mm hypoechoic lesion at the posterior dome of the liver correlates in location to the suspicious lesion by MRI. Given lesion size, depth of lesion and increased risk of bleeding, biopsy was not recommended and not performed today. The patient is scheduled for endoscopic ultrasound and possible biopsy of a pancreatic mass tomorrow at [facility]. 2. Stable cholelithiasis. Electronically Signed   By: Aletta Edouard M.D.   On: 11/26/2020 10:55     Assessment and plan- Patient is a 80 y.o. 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  PET scan Adds further credence to MRI findings which is concerning for hypermetabolic lesion in the dome of the liver with an SUV of 4.2.  We were not able to biopsy this spot to prove stage IV disease.  She did have an EUS at [facility] which is consistent with adenocarcinoma.  By EUS the tumor was 2.6 x 2.1 cm in size with invasion into splenic artery splenic vein and splenoportal confluence manifested by abutment.  No abnormal lymphadenopathy was seen.  The visualized portions of the liver were normal.  Baseline CA 19-9 is elevated at 681.  I explained to the patient that based on the PET scan as well as MRI findings, she has metastatic pancreatic cancer with liver metastases and this would be stage IV disease.  Curative surgery is not attempted in patients even with isolated liver metastases.  I did offer her second opinion at [facility] pancreaticobiliary surgery but patient does not wish to proceed with that at this time.  We could also consider proceeding with ultrasound of the liver  contrast enhanced to better assess the spot..  However patient understands that the PET CT scan findings are highly concerning for liver metastases and is okay with proceeding with systemic treatment at this time.  I would recommend modified FOLFIRINOX chemotherapy if the patient can tolerate it.  Although patient is 50 she is independent of her ADLs and IADLs and does not have any significant symptom burden.  I would further dose reduce oxaliplatin to 65 mg/m irinotecan will be at 150 mg/m. Discussed risks and benefits of this regimen including all but not limited to nausea, vomiting, low blood counts, risk of infections and hospitalization.  Risk of diarrhea and infusion reaction associated with irinotecan and peripheral neuropathy associated with oxaliplatin.  Treatment  will be given with a palliative  intent.  Discussed that systemic chemotherapy and stage IV pancreatic cancer could lead to an overall survival of up to a year.  Without treatment overall prognosis is less than 6 months in stage IV disease.  Patient understands and agrees to proceed  We will plan to proceed with port placement and chemo teach over the next 2 weeks.  I will tentatively see her back on 12/30/2020 to start first cycle of modified FOLFIRINOX chemotherapy.  Anxiety: Patient will continue as needed Ativan and will think about if she would like to proceed with Zoloft in addition as well.  We also discussed exact science studies with the patient and she is considering participating in that study for which she will need blood work on the day of her treatment on account of  Patient will also be seen by genetic counseling and nutrition on 01/02/2021.  We will attempt to run NGS testing on her tumor specimen at [facility].  Cancer Staging Pancreatic cancer metastasized to liver [facility]) Staging form: Exocrine Pancreas, AJCC 8th Edition - Clinical stage from 12/13/2020: Stage IV (cT2, cN0, cM1) - Signed by Sindy Guadeloupe, MD on 12/13/2020 Total positive nodes: 0     Visit Diagnosis 1. Pancreatic cancer metastasized to liver ([place])   2. Goals of care, counseling/discussion      Dr. Randa Evens, MD, MPH [facility] [phone] 12/13/2020 1:33 PM

## 2020-12-13 NOTE — Progress Notes (Signed)
Hematology/Oncology Consult note [facility]  [phone] [phone]  Patient Care Team: Dion Body, MD as PCP - General (Family Medicine) Clent Jacks, RN as Oncology Nurse Navigator   Name of the patient: Rhonda Day  [id number]  Jul 04, 1941   Date of visit: 12/13/20  Diagnosis-metastatic pancreatic cancer with liver metastases  Chief complaint/ Reason for visit-discuss pathology results and further management  Heme/Onc history: Patient is a 80 year old female who underwent ultrasound abdomen in December 2021 which showed hypoechoic mass in the pancreatic tail measuring 2.1 x 1.8 x 4.5 cm which was not seen on prior CT scans on MRI exams a year ago.  This was followed by a CT abdomen and pelvis with contrast which showed ill-defined hypervascular lesion in segment 8 of liver measuring 1.6 x 1.4 cm concerning for metastases.  2 other lesions 1.7 x 1 cm and 1.5 x 0.8 cm were also noted.  Large mass in the body of pancreas measuring 5.6 x 2.2 x 2.9 cm.  The lesion causes obstruction of the pancreatic duct resulting in ductal dilatation throughout the tail of the pancreas.  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  Splenic vein appears chronically occluded.  Lesion also comes in contact with splenic artery which remains patent.  This was followed by MRI abdomen which again showed similar findings.  Multiple foci of restricted diffusion with hyperenhancement noted in the right hepatic lobe.  Prominent portocaval lymph node seen for local nodal metastases along with a 5.3 cm body and tail pancreatic mass  Case was discussed at tumor board and lab was liver biopsy 2 weeks after patient initial Covid test that was positive.  However at the day of the biopsy this lesion was in close association with other vascular structures and given  the potential higher risks liver biopsy was aborted and EUS was recommended.  Patient is still not had a EUS done as she has to get cardiac clearance for her uncontrolled A. fib.  She is currently on Eliquis and Cardizem.    Patient had a EUS at [place] By Dr. Mont Dutton. Pathology was consistent with adenocarcinoma. Patient had a PET scan on 12/11/2020 which showed a hypermetabolic lesion in the medial aspect of the liver with an SUV of 4.2 there is a subtle hypodensity on the comparison CT measuring 9 mm at this level. The hypermetabolic lesion in the dome of the liver corresponds to the enhancing lesion on the comparison MRI. 3.4 x 2.7 cm lesion in the mid body of pancreas with an SUV of 5.1. No hypermetabolic peripancreatic lymph nodes.   Interval history-patient reports feeling anxious.  Denies any pain.  Appetite and weight have remained stable.  ECOG PS- 1 Pain scale- 0 Opioid associated constipation- no  Review of systems- Review of Systems  Constitutional: Positive for malaise/fatigue. Negative for chills, fever and weight loss.  HENT: Negative for congestion, ear discharge and nosebleeds.   Eyes: Negative for blurred vision.  Respiratory: Negative for cough, hemoptysis, sputum production, shortness of breath and wheezing.   Cardiovascular: Negative for chest pain, palpitations, orthopnea and claudication.  Gastrointestinal: Negative for abdominal pain, blood in stool, constipation, diarrhea, heartburn, melena, nausea and vomiting.  Genitourinary: Negative for dysuria, flank pain, frequency, hematuria and urgency.  Musculoskeletal: Negative for back pain, joint pain and myalgias.  Skin: Negative for rash.  Neurological: Negative for dizziness, tingling,  focal weakness, seizures, weakness and headaches.  Endo/Heme/Allergies: Does not bruise/bleed easily.  Psychiatric/Behavioral: Negative for depression and suicidal ideas. The patient is nervous/anxious. The patient does not have insomnia.        Allergies  Allergen Reactions  . Sulfa Antibiotics Rash    Other reaction(s): RASH     Past Medical History:  Diagnosis Date  . Bladder infection   . Diabetes mellitus without complication ([place])   . Hypertension      Past Surgical History:  Procedure Laterality Date  . BACK SURGERY    . TUBAL LIGATION      Social History   Socioeconomic History  . Marital status: Divorced    Spouse name: Not on file  . Number of children: Not on file  . Years of education: Not on file  . Highest education level: Not on file  Occupational History  . Not on file  Tobacco Use  . Smoking status: Unknown If Ever Smoked  . Smokeless tobacco: Never Used  Vaping Use  . Vaping Use: Never used  Substance and Sexual Activity  . Alcohol use: No    Alcohol/week: 0.0 standard drinks  . Drug use: No  . Sexual activity: Never  Other Topics Concern  . Not on file  Social History Narrative  . Not on file   Social Determinants of Health   Financial Resource Strain: Not on file  Food Insecurity: Not on file  Transportation Needs: Not on file  Physical Activity: Not on file  Stress: Not on file  Social Connections: Not on file  Intimate Partner Violence: Not on file    Family History  Problem Relation Age of Onset  . Kidney cancer Father   . Breast cancer Neg Hx      Current Outpatient Medications:  .  apixaban (ELIQUIS) 5 MG TABS tablet, Take by mouth., Disp: , Rfl:  .  ascorbic acid (VITAMIN C) 1000 MG tablet, Take by mouth., Disp: , Rfl:  .  azelastine (ASTELIN) 0.1 % nasal spray, azelastine 137 mcg (0.1 %) nasal spray aerosol, Disp: , Rfl:  .  azelastine (OPTIVAR) 0.05 % ophthalmic solution, , Disp: , Rfl: 3 .  b complex vitamins capsule, Take 1 capsule by mouth daily., Disp: , Rfl:  .  beta carotene 10000 UNIT capsule, Take by mouth., Disp: , Rfl:  .  diltiazem (TIAZAC) 180 MG 24 hr capsule, Take by mouth., Disp: , Rfl:  .  estradiol (ESTRACE) 0.5 MG tablet, Take 1  tablet (0.5 mg total) by mouth daily., Disp: 30 tablet, Rfl: 12 .  ferrous sulfate 325 (65 FE) MG EC tablet, Take by mouth., Disp: , Rfl:  .  ipratropium (ATROVENT) 0.06 % nasal spray, , Disp: , Rfl:  .  LORazepam (ATIVAN) 0.5 MG tablet, Take 1 tablet (0.5 mg total) by mouth every 8 (eight) hours., Disp: 60 tablet, Rfl: 0 .  losartan (COZAAR) 100 MG tablet, Take 100 mg by mouth daily., Disp: , Rfl:  .  metFORMIN (GLUCOPHAGE-XR) 500 MG 24 hr tablet, take 1 tablet by mouth once daily with DINNER, Disp: , Rfl: 0 .  Omega-3 Fatty Acids (FISH OIL PO), Take by mouth., Disp: , Rfl:  .  ONE TOUCH ULTRA TEST test strip, , Disp: , Rfl: 1 .  ONETOUCH DELICA LANCETS 62X MISC, 1 each by XX route as directed. Check CBG's fasting once daily. Dx: E11.9, Disp: , Rfl:  .  ULTRACET 37.5-325 MG tablet, , Disp: , Rfl: 0 .  ULTRAM 50 MG tablet, TK 2 TS PO TID PRN, Disp: , Rfl: 0  Physical exam:  Vitals:   12/13/20 1341  BP: 138/68  Pulse: 88  Resp: 20  Temp: 98 F (36.7 C)  TempSrc: Tympanic  SpO2: 95%  Weight: 152 lb 11.2 oz (69.3 kg)   Physical Exam Eyes:     Extraocular Movements: EOM normal.  Cardiovascular:     Rate and Rhythm: Normal rate.  Pulmonary:     Effort: Pulmonary effort is normal.  Skin:    General: Skin is warm and dry.  Neurological:     Mental Status: She is alert and oriented to person, place, and time.      CMP Latest Ref Rng & Units 12/02/2020  Glucose 70 - 99 mg/dL 180(H)  BUN 8 - 23 mg/dL 29(H)  Creatinine 0.44 - 1.00 mg/dL 1.50(H)  Sodium 135 - 145 mmol/L 137  Potassium 3.5 - 5.1 mmol/L 4.4  Chloride 98 - 111 mmol/L 99  CO2 22 - 32 mmol/L 26  Calcium 8.9 - 10.3 mg/dL 9.9  Total Protein 6.5 - 8.1 g/dL 8.4(H)  Total Bilirubin 0.3 - 1.2 mg/dL 0.5  Alkaline Phos 38 - 126 U/L 73  AST 15 - 41 U/L 20  ALT 0 - 44 U/L 27   CBC Latest Ref Rng & Units 12/02/2020  WBC 4.0 - 10.5 K/uL 7.5  Hemoglobin 12.0 - 15.0 g/dL 11.4(L)  Hematocrit 36.0 - 46.0 % 36.3  Platelets 150 -  400 K/uL 297    No images are attached to the encounter.  NM PET Image Initial (PI) Skull Base To Thigh  Result Date: 12/11/2020 CLINICAL DATA:  Initial treatment strategy for pancreatic cancer. EXAM: NUCLEAR MEDICINE PET SKULL BASE TO THIGH TECHNIQUE: 8.3 mCi F-18 FDG was injected intravenously. Full-ring PET imaging was performed from the skull base to thigh after the radiotracer. CT data was obtained and used for attenuation correction and anatomic localization. Fasting blood glucose: 184 mg/dl COMPARISON:  MRI 11/05/2018 FINDINGS: Mediastinal blood pool activity: SUV max 2.8 Liver activity: SUV max 3.3 NECK: No hypermetabolic lymph nodes in the neck. Incidental CT findings: none CHEST: No hypermetabolic mediastinal or hilar nodes. No suspicious pulmonary nodules on the CT scan. Incidental CT findings: none ABDOMEN/PELVIS: There is a hypermetabolic lesion in the medial aspect of the dome of the liver (image 81) with SUV max equal 4.2. There is a subtle hypodensity on the comparison CT measuring 9 mm at this level . No clear additional focal hypermetabolic lesions identified. The hypermetabolic lesion in the dome liver corresponds to enhancing lesion on comparison MRI. Hypermetabolic lesion in the mid body of the pancreas with SUV max equal 5.1. Lesion measures 3.4 by 2.7 cm on CT portion exam No hypermetabolic peripancreatic lymph nodes are identified. No hypermetabolic periportal nodes. Incidental CT findings: Nonobstructing LEFT renal calculus. Moderate volume stool throughout the colon. Tubal ligation clips noted. SKELETON: No focal hypermetabolic activity to suggest skeletal metastasis. Incidental CT findings: none IMPRESSION: 1. Hypermetabolic mass in the mid body the pancreas consistent primary pancreatic adenocarcinoma. 2. Hypermetabolic lesion in the dome the RIGHT hepatic lobe with associated low-attenuation lesion on CT consistent with hepatic metastasis. This region demonstrated enhancement on  comparison MRI. 3. No hypermetabolic periportal or peripancreatic lymph nodes. Electronically Signed   By: Suzy Bouchard M.D.   On: 12/11/2020 17:42   US Abdomen Limited RUQ (LIVER/GB)  Result Date: 11/26/2020 CLINICAL DATA:  Pancreatic mass and liver lesions. The most suspicious liver  lesion for metastatic disease is a roughly 1.1 cm lesion at the posteromedial dome of the liver by MRI. The patient presents for possible ultrasound-guided liver biopsy. EXAM: ULTRASOUND ABDOMEN LIMITED RIGHT UPPER QUADRANT COMPARISON:  MRI of the abdomen on 11/05/2020, CT of the abdomen on 10/17/2020 and abdominal ultrasound on 10/04/2020 FINDINGS: Gallbladder: Small gallstones again visualized. Common bile duct: Diameter: 6 mm Liver: Thorough survey of the liver parenchyma by ultrasound demonstrates only a single vaguely marginated hypoechoic lesion at the top of the liver at the posterior dome that correlates in location to the suspicious lesion by MRI 2. This measures approximately 13 x 9 x 13 mm. In this location, the lesion is roughly 11-12 cm deep to the skin and several portal vein and hepatic vein branches would likely be traversed to get to the lesion for potential biopsy. Given lesion size, depth of lesion and increased risk of bleeding complication, biopsy was not recommended and not performed today. IMPRESSION: 1. The single, vaguely marginated, 13 x 9 x 13 mm hypoechoic lesion at the posterior dome of the liver correlates in location to the suspicious lesion by MRI. Given lesion size, depth of lesion and increased risk of bleeding, biopsy was not recommended and not performed today. The patient is scheduled for endoscopic ultrasound and possible biopsy of a pancreatic mass tomorrow at [facility]. 2. Stable cholelithiasis. Electronically Signed   By: Aletta Edouard M.D.   On: 11/26/2020 10:55     Assessment and plan- Patient is a 80 y.o. 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  PET scan Adds further credence to MRI findings which is concerning for hypermetabolic lesion in the dome of the liver with an SUV of 4.2.  We were not able to biopsy this spot to prove stage IV disease.  She did have an EUS at [facility] which is consistent with adenocarcinoma.  By EUS the tumor was 2.6 x 2.1 cm in size with invasion into splenic artery splenic vein and splenoportal confluence manifested by abutment.  No abnormal lymphadenopathy was seen.  The visualized portions of the liver were normal.  Baseline CA 19-9 is elevated at 681.  I explained to the patient that based on the PET scan as well as MRI findings, she has metastatic pancreatic cancer with liver metastases and this would be stage IV disease.  Curative surgery is not attempted in patients even with isolated liver metastases.  I did offer her second opinion at [facility] pancreaticobiliary surgery but patient does not wish to proceed with that at this time.  We could also consider proceeding with ultrasound of the liver  contrast enhanced to better assess the spot..  However patient understands that the PET CT scan findings are highly concerning for liver metastases and is okay with proceeding with systemic treatment at this time.  I would recommend modified FOLFIRINOX chemotherapy if the patient can tolerate it.  Although patient is 64 she is independent of her ADLs and IADLs and does not have any significant symptom burden.  I would further dose reduce oxaliplatin to 65 mg/m irinotecan will be at 150 mg/m. Discussed risks and benefits of this regimen including all but not limited to nausea, vomiting, low blood counts, risk of infections and hospitalization.  Risk of diarrhea and infusion reaction associated with irinotecan and peripheral neuropathy associated with oxaliplatin.  Treatment  will be given with a palliative  intent.  Discussed that systemic chemotherapy and stage IV pancreatic cancer could lead to an overall survival of up to a year.  Without treatment overall prognosis is less than 6 months in stage IV disease.  Patient understands and agrees to proceed  We will plan to proceed with port placement and chemo teach over the next 2 weeks.  I will tentatively see her back on 12/30/2020 to start first cycle of modified FOLFIRINOX chemotherapy.  Anxiety: Patient will continue as needed Ativan and will think about if she would like to proceed with Zoloft in addition as well.  We also discussed exact science studies with the patient and she is considering participating in that study for which she will need blood work on the day of her treatment on account of  Patient will also be seen by genetic counseling and nutrition on 01/02/2021.  We will attempt to run NGS testing on her tumor specimen at [facility].  Cancer Staging Pancreatic cancer metastasized to liver [facility]) Staging form: Exocrine Pancreas, AJCC 8th Edition - Clinical stage from 12/13/2020: Stage IV (cT2, cN0, cM1) - Signed by Sindy Guadeloupe, MD on 12/13/2020 Total positive nodes: 0     Visit Diagnosis 1. Pancreatic cancer metastasized to liver ([place])   2. Goals of care, counseling/discussion      Dr. Randa Evens, MD, MPH [facility] [phone] 12/13/2020 1:33 PM

## 2020-12-13 NOTE — Research (Addendum)
Trial:  [employer] 2018/01 and 2018/01B Patient Rhonda Day was identified by Jeral Fruit, RN as a potential candidate for the above listed study.  This Clinical Research Nurse met with [name], [id number], on 12/13/20 in a manner and location that ensures patient privacy to discuss participation in the above listed research study.  Patient is Unaccompanied.  A copy of the informed consent document with embedded HIPAA language was provided to the patient with a brief review of eligibility criteria, study procedures, risks and benefits of taking part in clinical trials. The protocol ICF and Hipaa for 2018/01 is Version 3.0 dated 08/16/2018 and 2018/01B is Version 1.0 dated 04/29/2020.  Patient reads, speaks, and understands [place].   Patient was provided with the business card of this Nurse and Mauricio Po, [place],  encouraged to contact the research team with any questions.  Approximately 20 minutes were spent with the patient reviewing the informed consent documents.  Patient was provided the option of taking informed consent documents home to review and was encouraged to review at their convenience with their support network, including other care providers. Patient took the consent documents home to review. Research nurse will follow up with the patient next week after she has time to review the ICF and hipaa information at home. The patient was agreeable to sign an ROI form to obtain her pathology reports from [facility]. Jeral Fruit, RN 12/13/20 1:58 PM

## 2020-12-15 NOTE — Addendum Note (Signed)
Addended by: Luella Cook on: 12/15/2020 10:52 PM   Modules accepted: Orders

## 2020-12-16 ENCOUNTER — Other Ambulatory Visit: Payer: Self-pay | Admitting: *Deleted

## 2020-12-16 ENCOUNTER — Telehealth (INDEPENDENT_AMBULATORY_CARE_PROVIDER_SITE_OTHER): Payer: Self-pay

## 2020-12-16 ENCOUNTER — Telehealth: Payer: Self-pay

## 2020-12-16 DIAGNOSIS — C259 Malignant neoplasm of pancreas, unspecified: Secondary | ICD-10-CM

## 2020-12-16 DIAGNOSIS — C787 Secondary malignant neoplasm of liver and intrahepatic bile duct: Secondary | ICD-10-CM

## 2020-12-16 NOTE — Telephone Encounter (Signed)
Request for [facility] sent for specimen [phone], collected 12/06/2020, pancreas body mass.

## 2020-12-16 NOTE — Telephone Encounter (Signed)
I attempted to contact the patient to schedule her for a port placement and a message was left for a return call.

## 2020-12-19 ENCOUNTER — Telehealth (INDEPENDENT_AMBULATORY_CARE_PROVIDER_SITE_OTHER): Payer: Self-pay

## 2020-12-19 NOTE — Telephone Encounter (Signed)
I attempted to contact the patient regarding a port placement and a message was left for return call.

## 2020-12-20 ENCOUNTER — Telehealth: Payer: Self-pay | Admitting: *Deleted

## 2020-12-20 NOTE — Telephone Encounter (Signed)
Called pt and got her voicemail, told her that she has appt. For port insertion on 2/10 and she will need to stop her eliquis. I have been in contact with Dr. Nehemiah Massed and he has told me that he will contact pt Monday am and go over eliquis instructions and also will see pt on 3/9 Per secure chat with Dr. Nehemiah Massed. Per Dr.Kowalski he will speak to pt about lovenox and arrange this. He told me he will talk to pt about the lovenox. I just needed to tell her about the appt for port and that cardiology will contact her about the eliquis.left my direct number to call me

## 2020-12-20 NOTE — Patient Instructions (Signed)
Irinotecan injection What is this medicine? IRINOTECAN (ir in oh TEE kan ) is a chemotherapy drug. It is used to treat colon and rectal cancer. This medicine may be used for other purposes; ask your health care provider or pharmacist if you have questions. COMMON BRAND NAME(S): Camptosar What should I tell my health care provider before I take this medicine? They need to know if you have any of these conditions:  dehydration  diarrhea  infection (especially a virus infection such as chickenpox, cold sores, or herpes)  liver disease  low blood counts, like low white cell, platelet, or red cell counts  low levels of calcium, magnesium, or potassium in the blood  recent or ongoing radiation therapy  an unusual or allergic reaction to irinotecan, other medicines, foods, dyes, or preservatives  pregnant or trying to get pregnant  breast-feeding How should I use this medicine? This drug is given as an infusion into a vein. It is administered in a hospital or clinic by a specially trained health care professional. Talk to your pediatrician regarding the use of this medicine in children. Special care may be needed. Overdosage: If you think you have taken too much of this medicine contact a poison control center or emergency room at once. NOTE: This medicine is only for you. Do not share this medicine with others. What if I miss a dose? It is important not to miss your dose. Call your doctor or health care professional if you are unable to keep an appointment. What may interact with this medicine? Do not take this medicine with any of the following medications:  cobicistat  itraconazole This medicine may interact with the following medications:  antiviral medicines for HIV or AIDS  certain antibiotics like rifampin or rifabutin  certain medicines for fungal infections like ketoconazole, posaconazole, and voriconazole  certain medicines for seizures like carbamazepine,  phenobarbital, phenotoin  clarithromycin  gemfibrozil  nefazodone  St. John's Wort This list may not describe all possible interactions. Give your health care provider a list of all the medicines, herbs, non-prescription drugs, or dietary supplements you use. Also tell them if you smoke, drink alcohol, or use illegal drugs. Some items may interact with your medicine. What should I watch for while using this medicine? Your condition will be monitored carefully while you are receiving this medicine. You will need important blood work done while you are taking this medicine. This drug may make you feel generally unwell. This is not uncommon, as chemotherapy can affect healthy cells as well as cancer cells. Report any side effects. Continue your course of treatment even though you feel ill unless your doctor tells you to stop. In some cases, you may be given additional medicines to help with side effects. Follow all directions for their use. You may get drowsy or dizzy. Do not drive, use machinery, or do anything that needs mental alertness until you know how this medicine affects you. Do not stand or sit up quickly, especially if you are an older patient. This reduces the risk of dizzy or fainting spells. Call your health care professional for advice if you get a fever, chills, or sore throat, or other symptoms of a cold or flu. Do not treat yourself. This medicine decreases your body's ability to fight infections. Try to avoid being around people who are sick. Avoid taking products that contain aspirin, acetaminophen, ibuprofen, naproxen, or ketoprofen unless instructed by your doctor. These medicines may hide a fever. This medicine may increase your risk  to bruise or bleed. Call your doctor or health care professional if you notice any unusual bleeding. Be careful brushing and flossing your teeth or using a toothpick because you may get an infection or bleed more easily. If you have any dental work  done, tell your dentist you are receiving this medicine. Do not become pregnant while taking this medicine or for 6 months after stopping it. Women should inform their health care professional if they wish to become pregnant or think they might be pregnant. Men should not father a child while taking this medicine and for 3 months after stopping it. There is potential for serious side effects to an unborn child. Talk to your health care professional for more information. Do not breast-feed an infant while taking this medicine or for 7 days after stopping it. This medicine has caused ovarian failure in some women. This medicine may make it more difficult to get pregnant. Talk to your health care professional if you are concerned about your fertility. This medicine has caused decreased sperm counts in some men. This may make it more difficult to father a child. Talk to your health care professional if you are concerned about your fertility. What side effects may I notice from receiving this medicine? Side effects that you should report to your doctor or health care professional as soon as possible:  allergic reactions like skin rash, itching or hives, swelling of the face, lips, or tongue  chest pain  diarrhea  flushing, runny nose, sweating during infusion  low blood counts - this medicine may decrease the number of white blood cells, red blood cells and platelets. You may be at increased risk for infections and bleeding.  nausea, vomiting  pain, swelling, warmth in the leg  signs of decreased platelets or bleeding - bruising, pinpoint red spots on the skin, black, tarry stools, blood in the urine  signs of infection - fever or chills, cough, sore throat, pain or difficulty passing urine  signs of decreased red blood cells - unusually weak or tired, fainting spells, lightheadedness Side effects that usually do not require medical attention (report to your doctor or health care professional  if they continue or are bothersome):  constipation  hair loss  headache  loss of appetite  mouth sores  stomach pain This list may not describe all possible side effects. Call your doctor for medical advice about side effects. You may report side effects to FDA at 1-800-FDA-1088. Where should I keep my medicine? This drug is given in a hospital or clinic and will not be stored at home. NOTE: This sheet is a summary. It may not cover all possible information. If you have questions about this medicine, talk to your doctor, pharmacist, or health care provider.  2021 Elsevier/Gold Standard (2019-09-05 17:46:13) Oxaliplatin Injection What is this medicine? OXALIPLATIN (ox AL i PLA tin) is a chemotherapy drug. It targets fast dividing cells, like cancer cells, and causes these cells to die. This medicine is used to treat cancers of the colon and rectum, and many other cancers. This medicine may be used for other purposes; ask your health care provider or pharmacist if you have questions. COMMON BRAND NAME(S): Eloxatin What should I tell my health care provider before I take this medicine? They need to know if you have any of these conditions:  heart disease  history of irregular heartbeat  liver disease  low blood counts, like white cells, platelets, or red blood cells  lung or breathing disease,  like asthma  take medicines that treat or prevent blood clots  tingling of the fingers or toes, or other nerve disorder  an unusual or allergic reaction to oxaliplatin, other chemotherapy, other medicines, foods, dyes, or preservatives  pregnant or trying to get pregnant  breast-feeding How should I use this medicine? This drug is given as an infusion into a vein. It is administered in a hospital or clinic by a specially trained health care professional. Talk to your pediatrician regarding the use of this medicine in children. Special care may be needed. Overdosage: If you think  you have taken too much of this medicine contact a poison control center or emergency room at once. NOTE: This medicine is only for you. Do not share this medicine with others. What if I miss a dose? It is important not to miss a dose. Call your doctor or health care professional if you are unable to keep an appointment. What may interact with this medicine? Do not take this medicine with any of the following medications:  cisapride  dronedarone  pimozide  thioridazine This medicine may also interact with the following medications:  aspirin and aspirin-like medicines  certain medicines that treat or prevent blood clots like warfarin, apixaban, dabigatran, and rivaroxaban  cisplatin  cyclosporine  diuretics  medicines for infection like acyclovir, adefovir, amphotericin B, bacitracin, cidofovir, foscarnet, ganciclovir, gentamicin, pentamidine, vancomycin  NSAIDs, medicines for pain and inflammation, like ibuprofen or naproxen  other medicines that prolong the QT interval (an abnormal heart rhythm)  pamidronate  zoledronic acid This list may not describe all possible interactions. Give your health care provider a list of all the medicines, herbs, non-prescription drugs, or dietary supplements you use. Also tell them if you smoke, drink alcohol, or use illegal drugs. Some items may interact with your medicine. What should I watch for while using this medicine? Your condition will be monitored carefully while you are receiving this medicine. You may need blood work done while you are taking this medicine. This medicine may make you feel generally unwell. This is not uncommon as chemotherapy can affect healthy cells as well as cancer cells. Report any side effects. Continue your course of treatment even though you feel ill unless your healthcare professional tells you to stop. This medicine can make you more sensitive to cold. Do not drink cold drinks or use ice. Cover exposed skin  before coming in contact with cold temperatures or cold objects. When out in cold weather wear warm clothing and cover your mouth and nose to warm the air that goes into your lungs. Tell your doctor if you get sensitive to the cold. Do not become pregnant while taking this medicine or for 9 months after stopping it. Women should inform their health care professional if they wish to become pregnant or think they might be pregnant. Men should not father a child while taking this medicine and for 6 months after stopping it. There is potential for serious side effects to an unborn child. Talk to your health care professional for more information. Do not breast-feed a child while taking this medicine or for 3 months after stopping it. This medicine has caused ovarian failure in some women. This medicine may make it more difficult to get pregnant. Talk to your health care professional if you are concerned about your fertility. This medicine has caused decreased sperm counts in some men. This may make it more difficult to father a child. Talk to your health care professional if you  are concerned about your fertility. This medicine may increase your risk of getting an infection. Call your health care professional for advice if you get a fever, chills, or sore throat, or other symptoms of a cold or flu. Do not treat yourself. Try to avoid being around people who are sick. Avoid taking medicines that contain aspirin, acetaminophen, ibuprofen, naproxen, or ketoprofen unless instructed by your health care professional. These medicines may hide a fever. Be careful brushing or flossing your teeth or using a toothpick because you may get an infection or bleed more easily. If you have any dental work done, tell your dentist you are receiving this medicine. What side effects may I notice from receiving this medicine? Side effects that you should report to your doctor or health care professional as soon as  possible:  allergic reactions like skin rash, itching or hives, swelling of the face, lips, or tongue  breathing problems  cough  low blood counts - this medicine may decrease the number of white blood cells, red blood cells, and platelets. You may be at increased risk for infections and bleeding  nausea, vomiting  pain, redness, or irritation at site where injected  pain, tingling, numbness in the hands or feet  signs and symptoms of bleeding such as bloody or black, tarry stools; red or dark brown urine; spitting up blood or brown material that looks like coffee grounds; red spots on the skin; unusual bruising or bleeding from the eyes, gums, or nose  signs and symptoms of a dangerous change in heartbeat or heart rhythm like chest pain; dizziness; fast, irregular heartbeat; palpitations; feeling faint or lightheaded; falls  signs and symptoms of infection like fever; chills; cough; sore throat; pain or trouble passing urine  signs and symptoms of liver injury like dark yellow or brown urine; general ill feeling or flu-like symptoms; light-colored stools; loss of appetite; nausea; right upper belly pain; unusually weak or tired; yellowing of the eyes or skin  signs and symptoms of low red blood cells or anemia such as unusually weak or tired; feeling faint or lightheaded; falls  signs and symptoms of muscle injury like dark urine; trouble passing urine or change in the amount of urine; unusually weak or tired; muscle pain; back pain Side effects that usually do not require medical attention (report to your doctor or health care professional if they continue or are bothersome):  changes in taste  diarrhea  gas  hair loss  loss of appetite  mouth sores This list may not describe all possible side effects. Call your doctor for medical advice about side effects. You may report side effects to FDA at 1-800-FDA-1088. Where should I keep my medicine? This drug is given in a  hospital or clinic and will not be stored at home. NOTE: This sheet is a summary. It may not cover all possible information. If you have questions about this medicine, talk to your doctor, pharmacist, or health care provider.  2021 Elsevier/Gold Standard (2019-02-22 12:20:35) Fluorouracil, 5-FU injection What is this medicine? FLUOROURACIL, 5-FU (flure oh YOOR a sil) is a chemotherapy drug. It slows the growth of cancer cells. This medicine is used to treat many types of cancer like breast cancer, colon or rectal cancer, pancreatic cancer, and stomach cancer. This medicine may be used for other purposes; ask your health care provider or pharmacist if you have questions. COMMON BRAND NAME(S): Adrucil What should I tell my health care provider before I take this medicine? They need to  know if you have any of these conditions:  blood disorders  dihydropyrimidine dehydrogenase (DPD) deficiency  infection (especially a virus infection such as chickenpox, cold sores, or herpes)  kidney disease  liver disease  malnourished, poor nutrition  recent or ongoing radiation therapy  an unusual or allergic reaction to fluorouracil, other chemotherapy, other medicines, foods, dyes, or preservatives  pregnant or trying to get pregnant  breast-feeding How should I use this medicine? This drug is given as an infusion or injection into a vein. It is administered in a hospital or clinic by a specially trained health care professional. Talk to your pediatrician regarding the use of this medicine in children. Special care may be needed. Overdosage: If you think you have taken too much of this medicine contact a poison control center or emergency room at once. NOTE: This medicine is only for you. Do not share this medicine with others. What if I miss a dose? It is important not to miss your dose. Call your doctor or health care professional if you are unable to keep an appointment. What may interact  with this medicine? Do not take this medicine with any of the following medications:  live virus vaccines This medicine may also interact with the following medications:  medicines that treat or prevent blood clots like warfarin, enoxaparin, and dalteparin This list may not describe all possible interactions. Give your health care provider a list of all the medicines, herbs, non-prescription drugs, or dietary supplements you use. Also tell them if you smoke, drink alcohol, or use illegal drugs. Some items may interact with your medicine. What should I watch for while using this medicine? Visit your doctor for checks on your progress. This drug may make you feel generally unwell. This is not uncommon, as chemotherapy can affect healthy cells as well as cancer cells. Report any side effects. Continue your course of treatment even though you feel ill unless your doctor tells you to stop. In some cases, you may be given additional medicines to help with side effects. Follow all directions for their use. Call your doctor or health care professional for advice if you get a fever, chills or sore throat, or other symptoms of a cold or flu. Do not treat yourself. This drug decreases your body's ability to fight infections. Try to avoid being around people who are sick. This medicine may increase your risk to bruise or bleed. Call your doctor or health care professional if you notice any unusual bleeding. Be careful brushing and flossing your teeth or using a toothpick because you may get an infection or bleed more easily. If you have any dental work done, tell your dentist you are receiving this medicine. Avoid taking products that contain aspirin, acetaminophen, ibuprofen, naproxen, or ketoprofen unless instructed by your doctor. These medicines may hide a fever. Do not become pregnant while taking this medicine. Women should inform their doctor if they wish to become pregnant or think they might be pregnant.  There is a potential for serious side effects to an unborn child. Talk to your health care professional or pharmacist for more information. Do not breast-feed an infant while taking this medicine. Men should inform their doctor if they wish to father a child. This medicine may lower sperm counts. Do not treat diarrhea with over the counter products. Contact your doctor if you have diarrhea that lasts more than 2 days or if it is severe and watery. This medicine can make you more sensitive to the sun.  Keep out of the sun. If you cannot avoid being in the sun, wear protective clothing and use sunscreen. Do not use sun lamps or tanning beds/booths. What side effects may I notice from receiving this medicine? Side effects that you should report to your doctor or health care professional as soon as possible:  allergic reactions like skin rash, itching or hives, swelling of the face, lips, or tongue  low blood counts - this medicine may decrease the number of white blood cells, red blood cells and platelets. You may be at increased risk for infections and bleeding.  signs of infection - fever or chills, cough, sore throat, pain or difficulty passing urine  signs of decreased platelets or bleeding - bruising, pinpoint red spots on the skin, black, tarry stools, blood in the urine  signs of decreased red blood cells - unusually weak or tired, fainting spells, lightheadedness  breathing problems  changes in vision  chest pain  mouth sores  nausea and vomiting  pain, swelling, redness at site where injected  pain, tingling, numbness in the hands or feet  redness, swelling, or sores on hands or feet  stomach pain  unusual bleeding Side effects that usually do not require medical attention (report to your doctor or health care professional if they continue or are bothersome):  changes in finger or toe nails  diarrhea  dry or itchy skin  hair loss  headache  loss of  appetite  sensitivity of eyes to the light  stomach upset  unusually teary eyes This list may not describe all possible side effects. Call your doctor for medical advice about side effects. You may report side effects to FDA at 1-800-FDA-1088. Where should I keep my medicine? This drug is given in a hospital or clinic and will not be stored at home. NOTE: This sheet is a summary. It may not cover all possible information. If you have questions about this medicine, talk to your doctor, pharmacist, or health care provider.  2021 Elsevier/Gold Standard (2019-09-05 15:00:03) Leucovorin injection What is this medicine? LEUCOVORIN (loo koe VOR in) is used to prevent or treat the harmful effects of some medicines. This medicine is used to treat anemia caused by a low amount of folic acid in the body. It is also used with 5-fluorouracil (5-FU) to treat colon cancer. This medicine may be used for other purposes; ask your health care provider or pharmacist if you have questions. What should I tell my health care provider before I take this medicine? They need to know if you have any of these conditions:  anemia from low levels of vitamin B-12 in the blood  an unusual or allergic reaction to leucovorin, folic acid, other medicines, foods, dyes, or preservatives  pregnant or trying to get pregnant  breast-feeding How should I use this medicine? This medicine is for injection into a muscle or into a vein. It is given by a health care professional in a hospital or clinic setting. Talk to your pediatrician regarding the use of this medicine in children. Special care may be needed. Overdosage: If you think you have taken too much of this medicine contact a poison control center or emergency room at once. NOTE: This medicine is only for you. Do not share this medicine with others. What if I miss a dose? This does not apply. What may interact with this  medicine?  capecitabine  fluorouracil  phenobarbital  phenytoin  primidone  trimethoprim-sulfamethoxazole This list may not describe all possible interactions.  Give your health care provider a list of all the medicines, herbs, non-prescription drugs, or dietary supplements you use. Also tell them if you smoke, drink alcohol, or use illegal drugs. Some items may interact with your medicine. What should I watch for while using this medicine? Your condition will be monitored carefully while you are receiving this medicine. This medicine may increase the side effects of 5-fluorouracil, 5-FU. Tell your doctor or health care professional if you have diarrhea or mouth sores that do not get better or that get worse. What side effects may I notice from receiving this medicine? Side effects that you should report to your doctor or health care professional as soon as possible:  allergic reactions like skin rash, itching or hives, swelling of the face, lips, or tongue  breathing problems  fever, infection  mouth sores  unusual bleeding or bruising  unusually weak or tired Side effects that usually do not require medical attention (report to your doctor or health care professional if they continue or are bothersome):  constipation or diarrhea  loss of appetite  nausea, vomiting This list may not describe all possible side effects. Call your doctor for medical advice about side effects. You may report side effects to FDA at 1-800-FDA-1088. Where should I keep my medicine? This drug is given in a hospital or clinic and will not be stored at home. NOTE: This sheet is a summary. It may not cover all possible information. If you have questions about this medicine, talk to your doctor, pharmacist, or health care provider.  2021 Elsevier/Gold Standard (2008-04-10 16:50:29)

## 2020-12-20 NOTE — Telephone Encounter (Signed)
I received a call from [facility] at the [facility] regarding the patient. I explained that I have attempted to contact and have left messages with no return calls. Per Judeen Hammans she will make contact with the patient and give instructions as the patient will also to a Lovenox bridge for her Eliquis.     Devona Konig, [place], [place], RN Marda Stalker,   Patient scheduled with [facility] for 12/26/20, arrival time at 11:30 am, NPO 8 hours, stop Metformin 24 hrs before and 72 hrs after, stop Eliquis 3 days prior, can take all other meds with small sips of water and can have one person with her. Covid testing on 12/24/20 between 8-1 pm at the [facility].   Thank you,  Mickel Baas

## 2020-12-23 ENCOUNTER — Other Ambulatory Visit: Payer: Self-pay

## 2020-12-23 ENCOUNTER — Inpatient Hospital Stay: Payer: Medicare PPO | Attending: Oncology

## 2020-12-23 DIAGNOSIS — Z5189 Encounter for other specified aftercare: Secondary | ICD-10-CM | POA: Insufficient documentation

## 2020-12-23 DIAGNOSIS — C252 Malignant neoplasm of tail of pancreas: Secondary | ICD-10-CM | POA: Insufficient documentation

## 2020-12-23 DIAGNOSIS — Z5111 Encounter for antineoplastic chemotherapy: Secondary | ICD-10-CM | POA: Insufficient documentation

## 2020-12-23 DIAGNOSIS — M545 Low back pain, unspecified: Secondary | ICD-10-CM | POA: Insufficient documentation

## 2020-12-23 DIAGNOSIS — C787 Secondary malignant neoplasm of liver and intrahepatic bile duct: Secondary | ICD-10-CM | POA: Insufficient documentation

## 2020-12-23 DIAGNOSIS — R5383 Other fatigue: Secondary | ICD-10-CM | POA: Insufficient documentation

## 2020-12-24 ENCOUNTER — Telehealth: Payer: Self-pay | Admitting: *Deleted

## 2020-12-24 ENCOUNTER — Other Ambulatory Visit: Payer: Medicare PPO | Attending: Vascular Surgery

## 2020-12-24 NOTE — Telephone Encounter (Signed)
Spoke to [place] at [facility] vein [facility] vascular and the port insertion was moved to 3/11 arrival 11 am at [facility] mall. I had spoke to Dr. Nehemiah Massed about this yest. Afternoon when heard about the change and dr Nehemiah Massed said the pt does not need lovenox and he told her to stop the eliquis until after the port placement. I called pt. Yest. And I called today. Waiting call back .

## 2020-12-25 ENCOUNTER — Other Ambulatory Visit: Payer: Self-pay

## 2020-12-25 ENCOUNTER — Other Ambulatory Visit
Admission: RE | Admit: 2020-12-25 | Discharge: 2020-12-25 | Disposition: A | Discharge status: Home / Self Care | Payer: Medicare PPO | Source: Ambulatory Visit | Attending: Vascular Surgery | Admitting: Vascular Surgery

## 2020-12-25 DIAGNOSIS — Z01812 Encounter for preprocedural laboratory examination: Secondary | ICD-10-CM | POA: Insufficient documentation

## 2020-12-25 DIAGNOSIS — Z20822 Contact with and (suspected) exposure to covid-19: Secondary | ICD-10-CM | POA: Diagnosis not present

## 2020-12-25 LAB — SARS CORONAVIRUS 2 (TAT 6-24 HRS): SARS Coronavirus 2: NEGATIVE

## 2020-12-26 ENCOUNTER — Telehealth: Payer: Self-pay

## 2020-12-26 ENCOUNTER — Other Ambulatory Visit (INDEPENDENT_AMBULATORY_CARE_PROVIDER_SITE_OTHER): Payer: Self-pay | Admitting: Nurse Practitioner

## 2020-12-26 DIAGNOSIS — C259 Malignant neoplasm of pancreas, unspecified: Secondary | ICD-10-CM

## 2020-12-26 MED ORDER — SERTRALINE HCL 50 MG PO TABS: 50.0000 mg | ORAL_TABLET | Freq: Every day | ORAL | 2 refills | Status: DC

## 2020-12-26 NOTE — Telephone Encounter (Signed)
Received voicemail from Ms Adamcik reporting that her anxiety medication is not working at all. She is requesting something different.

## 2020-12-26 NOTE — Telephone Encounter (Signed)
Can you call her? We could either increase dose or add zoloft? You can follow her as well given her diagnosis

## 2020-12-26 NOTE — Telephone Encounter (Signed)
I called and spoke with patient.  She reports periods of intermittent anxiety throughout the day.  She denies significant depression or hopelessness.  No SI reported.  Patient's level of activity and sleep have been stable.  Anxiety has been worse since cancer diagnosis.  She is currently taking lorazepam on average once a day.  She finds that it helps some without any sedation or adverse effects.  Dr. Janese Banks had previously discussed trial of sertraline.  Patient is interested in this.  Patient verbalized understanding that it can take several weeks to maximize benefit.  In interim, she understands that she can liberalize frequency of lorazepam 2-3 times a day if needed.  Plan: -Start Zoloft 50 mg daily -Continue lorazepam 0.5 mg every 8 hours as needed -RTC on 3/17

## 2020-12-27 ENCOUNTER — Ambulatory Visit
Admission: RE | Admit: 2020-12-27 | Discharge: 2020-12-27 | Disposition: A | Discharge status: Home / Self Care | Payer: Medicare PPO | Attending: Vascular Surgery | Admitting: Vascular Surgery

## 2020-12-27 ENCOUNTER — Other Ambulatory Visit: Payer: Self-pay

## 2020-12-27 ENCOUNTER — Encounter
Admission: RE | Disposition: A | Discharge status: Home / Self Care | Payer: Self-pay | Source: Home / Self Care | Attending: Vascular Surgery

## 2020-12-27 ENCOUNTER — Encounter: Payer: Self-pay | Admitting: Vascular Surgery

## 2020-12-27 DIAGNOSIS — C787 Secondary malignant neoplasm of liver and intrahepatic bile duct: Secondary | ICD-10-CM | POA: Diagnosis not present

## 2020-12-27 DIAGNOSIS — Z79899 Other long term (current) drug therapy: Secondary | ICD-10-CM | POA: Insufficient documentation

## 2020-12-27 DIAGNOSIS — Z7984 Long term (current) use of oral hypoglycemic drugs: Secondary | ICD-10-CM | POA: Insufficient documentation

## 2020-12-27 DIAGNOSIS — Z79890 Hormone replacement therapy (postmenopausal): Secondary | ICD-10-CM | POA: Diagnosis not present

## 2020-12-27 DIAGNOSIS — Z882 Allergy status to sulfonamides status: Secondary | ICD-10-CM | POA: Insufficient documentation

## 2020-12-27 DIAGNOSIS — Z7901 Long term (current) use of anticoagulants: Secondary | ICD-10-CM | POA: Insufficient documentation

## 2020-12-27 DIAGNOSIS — C259 Malignant neoplasm of pancreas, unspecified: Secondary | ICD-10-CM | POA: Insufficient documentation

## 2020-12-27 HISTORY — PX: PORTA CATH INSERTION: CATH118285

## 2020-12-27 LAB — GLUCOSE, CAPILLARY: Glucose-Capillary: 151 mg/dL — ABNORMAL HIGH (ref 70–99)

## 2020-12-27 SURGERY — PORTA CATH INSERTION
Anesthesia: Moderate Sedation

## 2020-12-27 MED ORDER — MIDAZOLAM HCL 2 MG/2ML IJ SOLN
INTRAMUSCULAR | Status: AC | PRN
  Administered 2020-12-27 (×2): 1 mg via INTRAVENOUS

## 2020-12-27 MED ORDER — ONDANSETRON HCL 4 MG/2ML IJ SOLN: 4.0000 mg | Freq: Four times a day (QID) | INTRAMUSCULAR | Status: DC | PRN

## 2020-12-27 MED ORDER — FENTANYL CITRATE (PF) 100 MCG/2ML IJ SOLN
INTRAMUSCULAR | Status: AC | PRN
  Administered 2020-12-27: 50 ug via INTRAVENOUS
  Administered 2020-12-27: 25 ug via INTRAVENOUS

## 2020-12-27 MED ORDER — SODIUM CHLORIDE 0.9 % IV SOLN: INTRAVENOUS | Status: DC

## 2020-12-27 MED ORDER — FAMOTIDINE 20 MG PO TABS: 40.0000 mg | ORAL_TABLET | Freq: Once | ORAL | Status: DC | PRN

## 2020-12-27 MED ORDER — METHYLPREDNISOLONE SODIUM SUCC 125 MG IJ SOLR: 125.0000 mg | Freq: Once | INTRAMUSCULAR | Status: DC | PRN

## 2020-12-27 MED ORDER — MIDAZOLAM HCL 2 MG/2ML IJ SOLN
INTRAMUSCULAR | Status: AC
  Filled 2020-12-27: qty 2

## 2020-12-27 MED ORDER — HYDROMORPHONE HCL 1 MG/ML IJ SOLN: 1.0000 mg | Freq: Once | INTRAMUSCULAR | Status: DC | PRN

## 2020-12-27 MED ORDER — CEFAZOLIN SODIUM-DEXTROSE 2-4 GM/100ML-% IV SOLN
INTRAVENOUS | Status: AC
  Administered 2020-12-27: 2 g via INTRAVENOUS
  Filled 2020-12-27: qty 100

## 2020-12-27 MED ORDER — FENTANYL CITRATE (PF) 100 MCG/2ML IJ SOLN
INTRAMUSCULAR | Status: AC
  Filled 2020-12-27: qty 2

## 2020-12-27 MED ORDER — CEFAZOLIN SODIUM-DEXTROSE 2-4 GM/100ML-% IV SOLN: 2.0000 g | Freq: Once | INTRAVENOUS | Status: AC

## 2020-12-27 MED ORDER — DIPHENHYDRAMINE HCL 50 MG/ML IJ SOLN: 50.0000 mg | Freq: Once | INTRAMUSCULAR | Status: DC | PRN

## 2020-12-27 MED ORDER — CHLORHEXIDINE GLUCONATE CLOTH 2 % EX PADS
6.0000 | MEDICATED_PAD | Freq: Every day | CUTANEOUS | Status: DC
  Administered 2020-12-27: 6 via TOPICAL

## 2020-12-27 MED ORDER — SODIUM CHLORIDE 0.9 % IV SOLN
Freq: Once | INTRAVENOUS | Status: DC
  Filled 2020-12-27 (×2): qty 2

## 2020-12-27 MED ORDER — MIDAZOLAM HCL 2 MG/ML PO SYRP: 8.0000 mg | ORAL_SOLUTION | Freq: Once | ORAL | Status: DC | PRN

## 2020-12-27 SURGICAL SUPPLY — 19 items
ADH SKN CLS APL DERMABOND .7 (GAUZE/BANDAGES/DRESSINGS) ×1
COVER PROBE U/S 5X48 (MISCELLANEOUS) ×2 IMPLANT
COVER SURGICAL LIGHT HANDLE (MISCELLANEOUS) ×2 IMPLANT
DERMABOND ADVANCED (GAUZE/BANDAGES/DRESSINGS) ×1
DERMABOND ADVANCED .7 DNX12 (GAUZE/BANDAGES/DRESSINGS) ×1 IMPLANT
ELECT REM PT RETURN 9FT ADLT (ELECTROSURGICAL) ×2
ELECTRODE REM PT RTRN 9FT ADLT (ELECTROSURGICAL) ×1 IMPLANT
HANDLE YANKAUER SUCT BULB TIP (MISCELLANEOUS) ×2 IMPLANT
KIT PORT POWER 8FR ISP CVUE (Port) ×2 IMPLANT
PACK ANGIOGRAPHY (CUSTOM PROCEDURE TRAY) ×2 IMPLANT
PENCIL ELECTRO HAND CTR (MISCELLANEOUS) ×2 IMPLANT
SHIELD X-DRAPE GOLD 12X17 (MISCELLANEOUS) ×2 IMPLANT
SPONGE XRAY 4X4 16PLY STRL (MISCELLANEOUS) ×2 IMPLANT
SUT MNCRL AB 4-0 PS2 18 (SUTURE) ×2 IMPLANT
SUT VIC AB 3-0 SH 27 (SUTURE) ×2
SUT VIC AB 3-0 SH 27X BRD (SUTURE) ×1 IMPLANT
SYR TOOMEY 50ML (SYRINGE) ×2 IMPLANT
TOWEL OR 17X26 4PK STRL BLUE (TOWEL DISPOSABLE) ×2 IMPLANT
TUBING CONNECTING 10 (TUBING) ×4 IMPLANT

## 2020-12-27 NOTE — Interval H&P Note (Signed)
History and Physical Interval Note:  12/27/2020 11:15 AM  Rhonda Day  has presented today for surgery, with the diagnosis of Porta Cath Placement    Pancreatic Ca Covid  March 8.  The various methods of treatment have been discussed with the patient and family. After consideration of risks, benefits and other options for treatment, the patient has consented to  Procedure(s): PORTA CATH INSERTION (N/A) as a surgical intervention.  The patient's history has been reviewed, patient examined, no change in status, stable for surgery.  I have reviewed the patient's chart and labs.  Questions were answered to the patient's satisfaction.     Leotis Pain

## 2020-12-27 NOTE — Op Note (Signed)
      [facility]       Operative Note  Date: 12/27/2020  Preoperative diagnosis:  1. Pancreas cancer  Postoperative diagnosis:  Same as above  Procedures: #1. Ultrasound guidance for vascular access to the right internal jugular vein. #2. Fluoroscopic guidance for placement of catheter. #3. Placement of CT compatible Port-A-Cath, right internal jugular vein.  Surgeon: Leotis Pain, MD.   Anesthesia: Local with moderate conscious sedation for approximately 22  minutes using 2 mg of Versed and 75 mcg of Fentanyl  Fluoroscopy time: less than 1 minute  Contrast used: 0  Estimated blood loss: 3 cc  Indication for the procedure:  The patient is a 80 y.o.female with pancreas cancer.  The patient needs a Port-A-Cath for durable venous access, chemotherapy, lab draws, and CT scans. We are asked to place this. Risks and benefits were discussed and informed consent was obtained.  Description of procedure: The patient was brought to the vascular and interventional radiology suite.  Moderate conscious sedation was administered throughout the procedure during a face to face encounter with the patient with my supervision of the RN administering medicines and monitoring the patient's vital signs, pulse oximetry, telemetry and mental status throughout from the start of the procedure until the patient was taken to the recovery room. The right neck chest and shoulder were sterilely prepped and draped, and a sterile surgical field was created. Ultrasound was used to help visualize a patent right internal jugular vein. This was then accessed under direct ultrasound guidance without difficulty with the Seldinger needle and a permanent image was recorded. A J-wire was placed. After skin nick and dilatation, the peel-away sheath was then placed over the wire. I then anesthetized an area under the clavicle approximately 1-2 fingerbreadths. A transverse incision was created and an inferior pocket  was created with electrocautery and blunt dissection. The port was then brought onto the field, placed into the pocket and secured to the chest wall with 2 Prolene sutures. The catheter was connected to the port and tunneled from the subclavicular incision to the access site. Fluoroscopic guidance was then used to cut the catheter to an appropriate length. The catheter was then placed through the peel-away sheath and the peel-away sheath was removed. The catheter tip was parked in excellent location under fluorocoscopic guidance in the cavoatrial junction. The pocket was then irrigated with antibiotic impregnated saline and the wound was closed with a running 3-0 Vicryl and a 4-0 Monocryl. The access incision was closed with a single 4-0 Monocryl. The Huber needle was used to withdraw blood and flush the port with heparinized saline. Dermabond was then placed as a dressing. The patient tolerated the procedure well and was taken to the recovery room in stable condition.   Leotis Pain 12/27/2020 12:59 PM   This note was created with Dragon Medical transcription system. Any errors in dictation are purely unintentional.

## 2020-12-30 ENCOUNTER — Encounter: Payer: Self-pay | Admitting: Oncology

## 2020-12-30 ENCOUNTER — Inpatient Hospital Stay: Payer: Medicare PPO

## 2020-12-30 ENCOUNTER — Other Ambulatory Visit: Payer: Self-pay

## 2020-12-30 ENCOUNTER — Inpatient Hospital Stay (HOSPITAL_BASED_OUTPATIENT_CLINIC_OR_DEPARTMENT_OTHER): Payer: Medicare PPO | Admitting: Oncology

## 2020-12-30 VITALS — BP 155/76 | HR 96 | Temp 97.5°F | Resp 16 | Wt 149.7 lb

## 2020-12-30 DIAGNOSIS — C259 Malignant neoplasm of pancreas, unspecified: Secondary | ICD-10-CM | POA: Diagnosis not present

## 2020-12-30 DIAGNOSIS — M545 Low back pain, unspecified: Secondary | ICD-10-CM | POA: Diagnosis not present

## 2020-12-30 DIAGNOSIS — C787 Secondary malignant neoplasm of liver and intrahepatic bile duct: Secondary | ICD-10-CM | POA: Diagnosis not present

## 2020-12-30 DIAGNOSIS — R5383 Other fatigue: Secondary | ICD-10-CM | POA: Diagnosis not present

## 2020-12-30 DIAGNOSIS — Z5111 Encounter for antineoplastic chemotherapy: Secondary | ICD-10-CM | POA: Diagnosis not present

## 2020-12-30 DIAGNOSIS — C252 Malignant neoplasm of tail of pancreas: Secondary | ICD-10-CM | POA: Diagnosis not present

## 2020-12-30 DIAGNOSIS — Z5189 Encounter for other specified aftercare: Secondary | ICD-10-CM | POA: Diagnosis not present

## 2020-12-30 LAB — CBC WITH DIFFERENTIAL/PLATELET
Abs Immature Granulocytes: 0.03 10*3/uL (ref 0.00–0.07)
Basophils Absolute: 0 10*3/uL (ref 0.0–0.1)
Basophils Relative: 0 %
Eosinophils Absolute: 0.2 10*3/uL (ref 0.0–0.5)
Eosinophils Relative: 2 %
HCT: 35.3 % — ABNORMAL LOW (ref 36.0–46.0)
Hemoglobin: 11 g/dL — ABNORMAL LOW (ref 12.0–15.0)
Immature Granulocytes: 0 %
Lymphocytes Relative: 27 %
Lymphs Abs: 2.7 10*3/uL (ref 0.7–4.0)
MCH: 27.2 pg (ref 26.0–34.0)
MCHC: 31.2 g/dL (ref 30.0–36.0)
MCV: 87.4 fL (ref 80.0–100.0)
Monocytes Absolute: 0.6 10*3/uL (ref 0.1–1.0)
Monocytes Relative: 6 %
Neutro Abs: 6.4 10*3/uL (ref 1.7–7.7)
Neutrophils Relative %: 65 %
Platelets: 313 10*3/uL (ref 150–400)
RBC: 4.04 MIL/uL (ref 3.87–5.11)
RDW: 13.5 % (ref 11.5–15.5)
WBC: 10 10*3/uL (ref 4.0–10.5)
nRBC: 0 % (ref 0.0–0.2)

## 2020-12-30 LAB — COMPREHENSIVE METABOLIC PANEL
ALT: 11 U/L (ref 0–44)
AST: 19 U/L (ref 15–41)
Albumin: 4.1 g/dL (ref 3.5–5.0)
Alkaline Phosphatase: 71 U/L (ref 38–126)
Anion gap: 9 (ref 5–15)
BUN: 21 mg/dL (ref 8–23)
CO2: 27 mmol/L (ref 22–32)
Calcium: 9.6 mg/dL (ref 8.9–10.3)
Chloride: 102 mmol/L (ref 98–111)
Creatinine, Ser: 1.44 mg/dL — ABNORMAL HIGH (ref 0.44–1.00)
GFR, Estimated: 37 mL/min — ABNORMAL LOW (ref >60)
Glucose, Bld: 191 mg/dL — ABNORMAL HIGH (ref 70–99)
Potassium: 3.7 mmol/L (ref 3.5–5.1)
Sodium: 138 mmol/L (ref 135–145)
Total Bilirubin: 0.7 mg/dL (ref 0.3–1.2)
Total Protein: 7.7 g/dL (ref 6.5–8.1)

## 2020-12-30 MED ORDER — SODIUM CHLORIDE 0.9 % IV SOLN
150.0000 mg/m2 | Freq: Once | INTRAVENOUS | Status: AC
  Administered 2020-12-30: 280 mg via INTRAVENOUS
  Filled 2020-12-30: qty 10

## 2020-12-30 MED ORDER — ATROPINE SULFATE 1 MG/ML IJ SOLN
0.5000 mg | Freq: Once | INTRAMUSCULAR | Status: AC | PRN
  Administered 2020-12-30: 0.5 mg via INTRAVENOUS
  Filled 2020-12-30: qty 1

## 2020-12-30 MED ORDER — SODIUM CHLORIDE 0.9 % IV SOLN
150.0000 mg | Freq: Once | INTRAVENOUS | Status: AC
  Administered 2020-12-30: 150 mg via INTRAVENOUS
  Filled 2020-12-30: qty 150

## 2020-12-30 MED ORDER — PALONOSETRON HCL INJECTION 0.25 MG/5ML
0.2500 mg | Freq: Once | INTRAVENOUS | Status: AC
  Administered 2020-12-30: 0.25 mg via INTRAVENOUS
  Filled 2020-12-30: qty 5

## 2020-12-30 MED ORDER — OXALIPLATIN CHEMO INJECTION 100 MG/20ML
65.0000 mg/m2 | Freq: Once | INTRAVENOUS | Status: AC
  Administered 2020-12-30: 120 mg via INTRAVENOUS
  Filled 2020-12-30: qty 20

## 2020-12-30 MED ORDER — DEXTROSE 5 % IV SOLN
Freq: Once | INTRAVENOUS | Status: AC
  Filled 2020-12-30: qty 250

## 2020-12-30 MED ORDER — SODIUM CHLORIDE 0.9 % IV SOLN
2400.0000 mg/m2 | INTRAVENOUS | Status: DC
  Administered 2020-12-30: 4400 mg via INTRAVENOUS
  Filled 2020-12-30: qty 88

## 2020-12-30 MED ORDER — SODIUM CHLORIDE 0.9 % IV SOLN
10.0000 mg | Freq: Once | INTRAVENOUS | Status: AC
  Administered 2020-12-30: 10 mg via INTRAVENOUS
  Filled 2020-12-30: qty 10

## 2020-12-30 MED ORDER — SODIUM CHLORIDE 0.9 % IV SOLN
380.0000 mg/m2 | Freq: Once | INTRAVENOUS | Status: AC
  Administered 2020-12-30: 700 mg via INTRAVENOUS
  Filled 2020-12-30: qty 25

## 2020-12-30 NOTE — Research (Addendum)
Trial Name:  [employer] 2018/01 and 2018/01B  Patient Rhonda Day was identified by Jeral Fruit, RN as a potential candidate for the above listed study.  This Clinical Research Nurse met with [name], [id number] on 12/30/20 in a manner and location that ensures patient privacy to discuss participation in the above listed research study.  Patient is Unaccompanied.  Patient was previously provided with informed consent documents.  Patient confirmed they have read the informed consent documents.  As outlined in the informed consent form, this Nurse and Phill Myron Bunkley discussed the purpose of the research study, the investigational nature of the study, study procedures and requirements for study participation, potential risks and benefits of study participation, as well as alternatives to participation.  The patient understands participation is voluntary and they may withdraw from study participation at any time.  This study does not involve randomization.  This study does not involve an investigational drug or device. This study does not involve a placebo. Patient understands enrollment is pending full eligibility review.   Confidentiality and how the patient's information will be used as part of study participation were discussed.  Patient was informed there is reimbursement provided for their time and effort spent on trial participation.  The patient is encouraged to discuss research study participation with their insurance provider to determine what costs they may incur as part of study participation, including research related injury.    All questions were answered to patient's satisfaction.  The informed consent and separate HIPAA Authorization was reviewed page by page.  The patient's mental and emotional status is appropriate to provide informed consent, and the patient verbalizes an understanding of study participation.  Patient has agreed to participate in the above listed research  study and has voluntarily signed the informed consent version and separate HIPAA Authorization, version 3.0 dated 08/16/2018 on 12/30/20 at 0910 AM.  The patient was provided with a copy of the signed informed consent form and separate HIPAA Authorization for their reference.  No study specific procedures were obtained prior to the signing of the informed consent document.  Approximately 30 minutes were spent with the patient reviewing the informed consent documents.  After obtaining informed consent patient, voluntarily signed the optional Release of Information form for use throughout trial participation. The patient is aware that her blood will be drawn from her central line for the study prior to her first treatment in the infusion room. She will receive her gift card after her blood specimen is obtained.  Jeral Fruit, RN 12/30/20 9:23 AM  Patient complained of diarrhea documented in physician H & P's dated 01/02/2020 and 07/09/2020, and multiple other progress notes for reference. These complaints lead to diagnostic studies which eventually led to her diagnosis. The patients blood was obtained on 12/30/2020 at 0945 am from her central line for study participation. The patient was the provided with her $50 gift card by Mauricio Po, [place].  Jeral Fruit, RN 01/17/21 2:31 PM

## 2020-12-30 NOTE — Progress Notes (Signed)
Hematology/Oncology Consult note [facility]  Telephone:([phone] [phone]  Patient Care Team: Dion Body, MD as PCP - General (Family Medicine) Clent Jacks, RN as Oncology Nurse Navigator   Name of the patient: Rhonda Day  [id number]  22-Dec-1940   Date of visit: 12/30/20  Diagnosis- metastatic pancreatic cancer with liver metastases  Chief complaint/ Reason for visit-on treatment assessment prior to cycle 1 of modified FOLFIRINOX chemotherapy  Heme/Onc history: Patient is a 80 year old female who underwent ultrasound abdomen in December 2021 which showed hypoechoic mass in the pancreatic tail measuring 2.1 x 1.8 x 4.5 cm which was not seen on prior CT scans on MRI exams a year ago. This was followed by a CT abdomen and pelvis with contrast which showed ill-defined hypervascular lesion in segment 8 of liver measuring 1.6 x 1.4 cm concerning for metastases. 2 other lesions 1.7 x 1 cm and 1.5 x 0.8 cm were also noted. Large mass in the body of pancreas measuring 5.6 x 2.2 x 2.9 cm. The lesion causes obstruction of the pancreatic duct resulting in ductal dilatation throughout the tail of the pancreas. 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 Splenic vein appears chronically occluded. Lesion also comes in contact with splenic artery which remains patent. This was followed by MRI abdomen which again showed similar findings. Multiple foci of restricted diffusion with hyperenhancement noted in the right hepatic lobe. Prominent portocaval lymph node seen for local nodal metastases along with a 5.3 cm body and tail pancreatic mass  Case was discussed at tumor board and lab was liver biopsy 2 weeks after patient initial Covid test that was positive. However at the day of the biopsy this lesion was in close association with other  vascular structures and given the potential higher risks liver biopsy was aborted and EUS was recommended. She is currently on Eliquis and Cardizem.   Patient had a EUS at [place] By Dr. Mont Dutton. Pathology was consistent with adenocarcinoma. Patient had a PET scan on 12/11/2020 which showed a hypermetabolic lesion in the medial aspect of the liver with an SUV of 4.2 there is a subtle hypodensity on the comparison CT measuring 9 mm at this level. The hypermetabolic lesion in the dome of the liver corresponds to the enhancing lesion on the comparison MRI. 3.4 x 2.7 cm lesion in the mid body of pancreas with an SUV of 5.1. No hypermetabolic peripancreatic lymph nodes.   Interval history-reports feeling anxious about starting chemotherapy today.  She has baseline fatigue.  Denies other complaints at this time  ECOG PS- 1 Pain scale- 0 Opioid associated constipation- no  Review of systems- Review of Systems  Constitutional: Positive for malaise/fatigue. Negative for chills, fever and weight loss.  HENT: Negative for congestion, ear discharge and nosebleeds.   Eyes: Negative for blurred vision.  Respiratory: Negative for cough, hemoptysis, sputum production, shortness of breath and wheezing.   Cardiovascular: Negative for chest pain, palpitations, orthopnea and claudication.  Gastrointestinal: Negative for abdominal pain, blood in stool, constipation, diarrhea, heartburn, melena, nausea and vomiting.  Genitourinary: Negative for dysuria, flank pain, frequency, hematuria and urgency.  Musculoskeletal: Negative for back pain, joint pain and myalgias.  Skin: Negative for rash.  Neurological: Negative for dizziness, tingling, focal weakness, seizures, weakness and headaches.  Endo/Heme/Allergies: Does not bruise/bleed easily.  Psychiatric/Behavioral: Negative for depression and suicidal ideas. The patient is nervous/anxious.  The patient does not have insomnia.       Allergies  Allergen Reactions   . Sulfa Antibiotics Rash    Other reaction(s): RASH Other reaction(s): RASH      Past Medical History:  Diagnosis Date  . Bladder infection   . Diabetes mellitus without complication ([place])   . Hypertension      Past Surgical History:  Procedure Laterality Date  . BACK SURGERY    . PORTA CATH INSERTION N/A 12/27/2020   Procedure: PORTA CATH INSERTION;  Surgeon: Algernon Huxley, MD;  Location: [facility];  Service: Cardiovascular;  Laterality: N/A;  . TUBAL LIGATION      Social History   Socioeconomic History  . Marital status: Divorced    Spouse name: Not on file  . Number of children: Not on file  . Years of education: Not on file  . Highest education level: Not on file  Occupational History  . Not on file  Tobacco Use  . Smoking status: Former Smoker    Quit date: 11/20/1979    Years since quitting: 41.1  . Smokeless tobacco: Never Used  Vaping Use  . Vaping Use: Never used  Substance and Sexual Activity  . Alcohol use: No    Alcohol/week: 0.0 standard drinks  . Drug use: No  . Sexual activity: Never  Other Topics Concern  . Not on file  Social History Narrative  . Not on file   Social Determinants of Health   Financial Resource Strain: Not on file  Food Insecurity: Not on file  Transportation Needs: Not on file  Physical Activity: Not on file  Stress: Not on file  Social Connections: Not on file  Intimate Partner Violence: Not on file    Family History  Problem Relation Age of Onset  . Kidney cancer Father   . Breast cancer Neg Hx      Current Outpatient Medications:  .  apixaban (ELIQUIS) 5 MG TABS tablet, Take by mouth., Disp: , Rfl:  .  ascorbic acid (VITAMIN C) 1000 MG tablet, Take by mouth., Disp: , Rfl:  .  azelastine (ASTELIN) 0.1 % nasal spray, azelastine 137 mcg (0.1 %) nasal spray aerosol, Disp: , Rfl:  .  azelastine (OPTIVAR) 0.05 % ophthalmic solution, , Disp: , Rfl: 3 .  b complex vitamins capsule, Take 1 capsule by mouth  daily., Disp: , Rfl:  .  beta carotene 10000 UNIT capsule, Take by mouth., Disp: , Rfl:  .  diltiazem (TIAZAC) 180 MG 24 hr capsule, Take by mouth., Disp: , Rfl:  .  ferrous sulfate 325 (65 FE) MG EC tablet, Take by mouth., Disp: , Rfl:  .  ipratropium (ATROVENT) 0.06 % nasal spray, , Disp: , Rfl:  .  lidocaine-prilocaine (EMLA) cream, Apply to affected area once, Disp: 30 g, Rfl: 3 .  LORazepam (ATIVAN) 0.5 MG tablet, Take 1 tablet (0.5 mg total) by mouth every 8 (eight) hours., Disp: 60 tablet, Rfl: 0 .  losartan (COZAAR) 100 MG tablet, Take 100 mg by mouth daily., Disp: , Rfl:  .  metFORMIN (GLUCOPHAGE-XR) 500 MG 24 hr tablet, take 1 tablet by mouth once daily with DINNER, Disp: , Rfl: 0 .  Omega-3 Fatty Acids (FISH OIL PO), Take by mouth., Disp: , Rfl:  .  ondansetron (ZOFRAN) 8 MG tablet, Take 1 tablet (8 mg total) by mouth 2 (two) times daily as needed. Start on day 3 after chemotherapy., Disp: 30 tablet, Rfl: 1 .  ONE  TOUCH ULTRA TEST test strip, , Disp: , Rfl: 1 .  ONETOUCH DELICA LANCETS 82X MISC, 1 each by XX route as directed. Check CBG's fasting once daily. Dx: E11.9, Disp: , Rfl:  .  prochlorperazine (COMPAZINE) 10 MG tablet, Take 1 tablet (10 mg total) by mouth every 6 (six) hours as needed (Nausea or vomiting)., Disp: 30 tablet, Rfl: 1 .  sertraline (ZOLOFT) 50 MG tablet, Take 1 tablet (50 mg total) by mouth daily., Disp: 30 tablet, Rfl: 2 .  ULTRACET 37.5-325 MG tablet, , Disp: , Rfl: 0 .  ULTRAM 50 MG tablet, TK 2 TS PO TID PRN, Disp: , Rfl: 0 .  dexamethasone (DECADRON) 4 MG tablet, Take 2 tablets (8 mg total) by mouth daily. Start the day after chemotherapy for 3 days. Take with food. (Patient not taking: Reported on 12/30/2020), Disp: 8 tablet, Rfl: 5 .  estradiol (ESTRACE) 0.5 MG tablet, Take 1 tablet (0.5 mg total) by mouth daily. (Patient not taking: No sig reported), Disp: 30 tablet, Rfl: 12 .  loperamide (IMODIUM A-D) 2 MG tablet, Take 2 at onset of diarrhea, then 1 every  2hrs until 12hr without a BM. May take 2 tab every 4hrs at bedtime. If diarrhea recurs repeat. (Patient not taking: Reported on 12/30/2020), Disp: 100 tablet, Rfl: 1 No current facility-administered medications for this visit.  Facility-Administered Medications Ordered in Other Visits:  .  atropine injection 0.5 mg, 0.5 mg, Intravenous, Once PRN, Sindy Guadeloupe, MD .  fluorouracil (ADRUCIL) 4,400 mg in sodium chloride 0.9 % 62 mL chemo infusion, 2,400 mg/m2 (Treatment Plan Recorded), Intravenous, 1 day or 1 dose, Sindy Guadeloupe, MD .  irinotecan (CAMPTOSAR) 280 mg in sodium chloride 0.9 % 500 mL chemo infusion, 150 mg/m2 (Treatment Plan Recorded), Intravenous, Once, Sindy Guadeloupe, MD .  leucovorin 700 mg in sodium chloride 0.9 % 250 mL infusion, 380 mg/m2 (Treatment Plan Recorded), Intravenous, Once, Sindy Guadeloupe, MD .  oxaliplatin (ELOXATIN) 120 mg in dextrose 5 % 500 mL chemo infusion, 65 mg/m2 (Treatment Plan Recorded), Intravenous, Once, Sindy Guadeloupe, MD, Last Rate: 262 mL/hr at 12/30/20 1044, 120 mg at 12/30/20 1044  Physical exam:  Vitals:   12/30/20 0850  BP: (!) 155/76  Pulse: 96  Resp: 16  Temp: (!) 97.5 F (36.4 C)  TempSrc: Tympanic  SpO2: 100%  Weight: 149 lb 11.2 oz (67.9 kg)   Physical Exam Constitutional:      General: She is not in acute distress. Cardiovascular:     Rate and Rhythm: Regular rhythm. Tachycardia present.  Pulmonary:     Effort: Pulmonary effort is normal.     Breath sounds: Normal breath sounds.     Comments: Right chest wall port in place Abdominal:     General: Bowel sounds are normal.     Palpations: Abdomen is soft.  Skin:    General: Skin is warm and dry.  Neurological:     Mental Status: She is alert and oriented to person, place, and time.      CMP Latest Ref Rng & Units 12/30/2020  Glucose 70 - 99 mg/dL 191(H)  BUN 8 - 23 mg/dL 21  Creatinine 0.44 - 1.00 mg/dL 1.44(H)  Sodium 135 - 145 mmol/L 138  Potassium 3.5 - 5.1 mmol/L 3.7   Chloride 98 - 111 mmol/L 102  CO2 22 - 32 mmol/L 27  Calcium 8.9 - 10.3 mg/dL 9.6  Total Protein 6.5 - 8.1 g/dL 7.7  Total Bilirubin 0.3 -  1.2 mg/dL 0.7  Alkaline Phos 38 - 126 U/L 71  AST 15 - 41 U/L 19  ALT 0 - 44 U/L 11   CBC Latest Ref Rng & Units 12/30/2020  WBC 4.0 - 10.5 K/uL 10.0  Hemoglobin 12.0 - 15.0 g/dL 11.0(L)  Hematocrit 36.0 - 46.0 % 35.3(L)  Platelets 150 - 400 K/uL 313    No images are attached to the encounter.  PERIPHERAL VASCULAR CATHETERIZATION  Result Date: 12/27/2020 See op note  NM PET Image Initial (PI) Skull Base To Thigh  Result Date: 12/11/2020 CLINICAL DATA:  Initial treatment strategy for pancreatic cancer. EXAM: NUCLEAR MEDICINE PET SKULL BASE TO THIGH TECHNIQUE: 8.3 mCi F-18 FDG was injected intravenously. Full-ring PET imaging was performed from the skull base to thigh after the radiotracer. CT data was obtained and used for attenuation correction and anatomic localization. Fasting blood glucose: 184 mg/dl COMPARISON:  MRI 11/05/2018 FINDINGS: Mediastinal blood pool activity: SUV max 2.8 Liver activity: SUV max 3.3 NECK: No hypermetabolic lymph nodes in the neck. Incidental CT findings: none CHEST: No hypermetabolic mediastinal or hilar nodes. No suspicious pulmonary nodules on the CT scan. Incidental CT findings: none ABDOMEN/PELVIS: There is a hypermetabolic lesion in the medial aspect of the dome of the liver (image 81) with SUV max equal 4.2. There is a subtle hypodensity on the comparison CT measuring 9 mm at this level . No clear additional focal hypermetabolic lesions identified. The hypermetabolic lesion in the dome liver corresponds to enhancing lesion on comparison MRI. Hypermetabolic lesion in the mid body of the pancreas with SUV max equal 5.1. Lesion measures 3.4 by 2.7 cm on CT portion exam No hypermetabolic peripancreatic lymph nodes are identified. No hypermetabolic periportal nodes. Incidental CT findings: Nonobstructing LEFT renal calculus.  Moderate volume stool throughout the colon. Tubal ligation clips noted. SKELETON: No focal hypermetabolic activity to suggest skeletal metastasis. Incidental CT findings: none IMPRESSION: 1. Hypermetabolic mass in the mid body the pancreas consistent primary pancreatic adenocarcinoma. 2. Hypermetabolic lesion in the dome the RIGHT hepatic lobe with associated low-attenuation lesion on CT consistent with hepatic metastasis. This region demonstrated enhancement on comparison MRI. 3. No hypermetabolic periportal or peripancreatic lymph nodes. Electronically Signed   By: Suzy Bouchard M.D.   On: 12/11/2020 17:42     Assessment and plan- Patient is a 80 y.o. 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  I am also giving her oxaliplatin at a reduced dose of 65 mg per metered squared.  Discussed risks and benefits of chemotherapy including all but not limited to nausea, vomiting, low blood counts, risk of infections and hospitalizations.  Risk of peripheral neuropathy associated with oxaliplatin and diarrhea associated with irinotecan.  Treatment will be given IV every 2 weeks until progression or toxicity.  She will come back on day 3 for pump disconnect.  I will see her back in 2 weeks time for cycle 2.  Based on her tolerance we will decide if we need to make any modifications to this regimen.  Anxiety: Continue as needed Ativan   Visit Diagnosis 1. Pancreatic cancer metastasized to liver [facility])      Dr. Randa Evens, MD, MPH [place] [phone] 12/30/2020 11:16 AM

## 2020-12-31 ENCOUNTER — Telehealth: Payer: Self-pay

## 2020-12-31 NOTE — Telephone Encounter (Signed)
Telephone call to patient for follow up after receiving first infusion.   Patient states infusion went great.  States eating good and drinking plenty of fluids.   Denies any nausea or vomiting.  Encouraged patient to call for any concerns or questions. 

## 2021-01-01 ENCOUNTER — Inpatient Hospital Stay: Payer: Medicare PPO

## 2021-01-01 DIAGNOSIS — C259 Malignant neoplasm of pancreas, unspecified: Secondary | ICD-10-CM

## 2021-01-01 DIAGNOSIS — Z5111 Encounter for antineoplastic chemotherapy: Secondary | ICD-10-CM | POA: Diagnosis not present

## 2021-01-01 MED ORDER — HEPARIN SOD (PORK) LOCK FLUSH 100 UNIT/ML IV SOLN
500.0000 [IU] | Freq: Once | INTRAVENOUS | Status: AC | PRN
  Administered 2021-01-01: 500 [IU]
  Filled 2021-01-01: qty 5

## 2021-01-01 MED ORDER — SODIUM CHLORIDE 0.9% FLUSH
10.0000 mL | INTRAVENOUS | Status: DC | PRN
  Administered 2021-01-01: 10 mL
  Filled 2021-01-01: qty 10

## 2021-01-02 ENCOUNTER — Inpatient Hospital Stay (HOSPITAL_BASED_OUTPATIENT_CLINIC_OR_DEPARTMENT_OTHER): Payer: Medicare PPO | Admitting: Hospice and Palliative Medicine

## 2021-01-02 ENCOUNTER — Inpatient Hospital Stay: Payer: Medicare PPO

## 2021-01-02 ENCOUNTER — Encounter: Payer: Self-pay | Admitting: Hospice and Palliative Medicine

## 2021-01-02 ENCOUNTER — Other Ambulatory Visit: Payer: Self-pay

## 2021-01-02 ENCOUNTER — Inpatient Hospital Stay: Payer: Medicare PPO | Admitting: Licensed Clinical Social Worker

## 2021-01-02 VITALS — BP 133/87 | HR 80 | Temp 98.7°F | Resp 6 | Wt 149.5 lb

## 2021-01-02 DIAGNOSIS — Z5111 Encounter for antineoplastic chemotherapy: Secondary | ICD-10-CM | POA: Diagnosis not present

## 2021-01-02 DIAGNOSIS — C259 Malignant neoplasm of pancreas, unspecified: Secondary | ICD-10-CM | POA: Diagnosis not present

## 2021-01-02 DIAGNOSIS — C787 Secondary malignant neoplasm of liver and intrahepatic bile duct: Secondary | ICD-10-CM

## 2021-01-02 DIAGNOSIS — F32A Depression, unspecified: Secondary | ICD-10-CM

## 2021-01-02 DIAGNOSIS — Z515 Encounter for palliative care: Secondary | ICD-10-CM | POA: Diagnosis not present

## 2021-01-02 NOTE — Progress Notes (Signed)
[facility] [facility]  Telephone:([phone] [phone]   Name: Rhonda Day Date: 01/02/2021 MRN: [id number]  DOB: December 28, 1940  Patient Care Team: Dion Body, MD as PCP - General (Family Medicine) Clent Jacks, RN as Oncology Nurse Navigator    REASON FOR CONSULTATION: Rhonda Day is a 80 y.o. 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  Patient was found to have a pancreatic tail mass in December 2021.  She ultimately underwent EUS and biopsy with pathology consistent for adenocarcinoma.  Patient is referred to palliative care to help address goals and manage ongoing symptoms.   SOCIAL HISTORY:     reports that she quit smoking about 41 years ago. She has never used smokeless tobacco. She reports that she does not drink alcohol and does not use drugs.  Patient is married and lives at home with her husband.  She has no children.  ADVANCE DIRECTIVES:  None on file  CODE STATUS:   PAST MEDICAL HISTORY: Past Medical History:  Diagnosis Date  . Bladder infection   . Diabetes mellitus without complication ([place])   . Hypertension   . Pancreatic cancer ([place])    with liver mass    PAST SURGICAL HISTORY:  Past Surgical History:  Procedure Laterality Date  . BACK SURGERY    . PORTA CATH INSERTION N/A 12/27/2020   Procedure: PORTA CATH INSERTION;  Surgeon: Algernon Huxley, MD;  Location: [facility];  Service: Cardiovascular;  Laterality: N/A;  . TUBAL LIGATION      HEMATOLOGY/ONCOLOGY HISTORY:  Oncology History  Pancreatic cancer metastasized to liver ([place])  12/13/2020 Initial Diagnosis   Pancreatic cancer metastasized to liver ([place])   12/13/2020 Cancer Staging   Staging form: Exocrine Pancreas, AJCC 8th Edition - Clinical stage from 12/13/2020: Stage IV (cT2, cN0, cM1) - Signed by Sindy Guadeloupe, MD on  12/13/2020 Total positive nodes: 0   12/30/2020 -  Chemotherapy    Patient is on Treatment Plan: PANCREAS MODIFIED FOLFIRINOX Q14D X 4 CYCLES        ALLERGIES:  is allergic to sulfa antibiotics.  MEDICATIONS:  Current Outpatient Medications  Medication Sig Dispense Refill  . diltiazem (TIAZAC) 180 MG 24 hr capsule Take by mouth.    Marland Kitchen ULTRAM 50 MG tablet TK 2 TS PO TID PRN  0  . ascorbic acid (VITAMIN C) 1000 MG tablet Take by mouth. (Patient not taking: Reported on 01/02/2021)    . azelastine (ASTELIN) 0.1 % nasal spray azelastine 137 mcg (0.1 %) nasal spray aerosol (Patient not taking: Reported on 01/02/2021)    . azelastine (OPTIVAR) 0.05 % ophthalmic solution  (Patient not taking: Reported on 01/02/2021)  3  . b complex vitamins capsule Take 1 capsule by mouth daily. (Patient not taking: Reported on 01/02/2021)    . beta carotene 10000 UNIT capsule Take by mouth. (Patient not taking: Reported on 01/02/2021)    . dexamethasone (DECADRON) 4 MG tablet Take 2 tablets (8 mg total) by mouth daily. Start the day after chemotherapy for 3 days. Take with food. (Patient not taking: No sig reported) 8 tablet 5  . estradiol (ESTRACE) 0.5 MG tablet Take 1 tablet (0.5 mg total) by mouth daily. (Patient not taking: No sig reported) 30 tablet 12  . ferrous sulfate 325 (65 FE) MG EC tablet Take by mouth. (Patient not taking: Reported on 01/02/2021)    . ipratropium (  ATROVENT) 0.06 % nasal spray  (Patient not taking: Reported on 01/02/2021)    . lidocaine-prilocaine (EMLA) cream Apply to affected area once (Patient not taking: Reported on 01/02/2021) 30 g 3  . loperamide (IMODIUM A-D) 2 MG tablet Take 2 at onset of diarrhea, then 1 every 2hrs until 12hr without a BM. May take 2 tab every 4hrs at bedtime. If diarrhea recurs repeat. (Patient not taking: No sig reported) 100 tablet 1  . LORazepam (ATIVAN) 0.5 MG tablet Take 1 tablet (0.5 mg total) by mouth every 8 (eight) hours. (Patient not taking: Reported on  01/02/2021) 60 tablet 0  . losartan (COZAAR) 100 MG tablet Take 100 mg by mouth daily. (Patient not taking: Reported on 01/02/2021)    . metFORMIN (GLUCOPHAGE-XR) 500 MG 24 hr tablet take 1 tablet by mouth once daily with DINNER (Patient not taking: Reported on 01/02/2021)  0  . Omega-3 Fatty Acids (FISH OIL PO) Take by mouth. (Patient not taking: Reported on 01/02/2021)    . ondansetron (ZOFRAN) 8 MG tablet Take 1 tablet (8 mg total) by mouth 2 (two) times daily as needed. Start on day 3 after chemotherapy. (Patient not taking: Reported on 01/02/2021) 30 tablet 1  . ONE TOUCH ULTRA TEST test strip  (Patient not taking: Reported on 01/02/2021)  1  . ONETOUCH DELICA LANCETS 44Q MISC 1 each by XX route as directed. Check CBG's fasting once daily. Dx: E11.9 (Patient not taking: Reported on 01/02/2021)    . prochlorperazine (COMPAZINE) 10 MG tablet Take 1 tablet (10 mg total) by mouth every 6 (six) hours as needed (Nausea or vomiting). (Patient not taking: Reported on 01/02/2021) 30 tablet 1  . sertraline (ZOLOFT) 50 MG tablet Take 1 tablet (50 mg total) by mouth daily. (Patient not taking: Reported on 01/02/2021) 30 tablet 2  . ULTRACET 37.5-325 MG tablet Take 1 tablet by mouth 3 (three) times daily.  0   No current facility-administered medications for this visit.    VITAL SIGNS: BP 133/87   Pulse 80   Temp 98.7 F (37.1 C) (Oral)   Resp (!) 6   Wt 149 lb 8 oz (67.8 kg)   LMP  (LMP Unknown)   BMI 22.08 kg/m  Filed Weights   01/02/21 1336  Weight: 149 lb 8 oz (67.8 kg)    Estimated body mass index is 22.08 kg/m as calculated from the following:   Height as of 12/27/20: 5\' 9"  (1.753 m).   Weight as of this encounter: 149 lb 8 oz (67.8 kg).  LABS: CBC:    Component Value Date/Time   WBC 10.0 12/30/2020 0811   HGB 11.0 (L) 12/30/2020 0811   HCT 35.3 (L) 12/30/2020 0811   PLT 313 12/30/2020 0811   MCV 87.4 12/30/2020 0811   NEUTROABS 6.4 12/30/2020 0811   LYMPHSABS 2.7 12/30/2020 0811    MONOABS 0.6 12/30/2020 0811   EOSABS 0.2 12/30/2020 0811   BASOSABS 0.0 12/30/2020 0811   Comprehensive Metabolic Panel:    Component Value Date/Time   NA 138 12/30/2020 0811   K 3.7 12/30/2020 0811   CL 102 12/30/2020 0811   CO2 27 12/30/2020 0811   BUN 21 12/30/2020 0811   CREATININE 1.44 (H) 12/30/2020 0811   GLUCOSE 191 (H) 12/30/2020 0811   CALCIUM 9.6 12/30/2020 0811   AST 19 12/30/2020 0811   ALT 11 12/30/2020 0811   ALKPHOS 71 12/30/2020 0811   BILITOT 0.7 12/30/2020 0811   PROT 7.7 12/30/2020 2863  ALBUMIN 4.1 12/30/2020 0811    RADIOGRAPHIC STUDIES: PERIPHERAL VASCULAR CATHETERIZATION  Result Date: 12/27/2020 See op note  NM PET Image Initial (PI) Skull Base To Thigh  Result Date: 12/11/2020 CLINICAL DATA:  Initial treatment strategy for pancreatic cancer. EXAM: NUCLEAR MEDICINE PET SKULL BASE TO THIGH TECHNIQUE: 8.3 mCi F-18 FDG was injected intravenously. Full-ring PET imaging was performed from the skull base to thigh after the radiotracer. CT data was obtained and used for attenuation correction and anatomic localization. Fasting blood glucose: 184 mg/dl COMPARISON:  MRI 11/05/2018 FINDINGS: Mediastinal blood pool activity: SUV max 2.8 Liver activity: SUV max 3.3 NECK: No hypermetabolic lymph nodes in the neck. Incidental CT findings: none CHEST: No hypermetabolic mediastinal or hilar nodes. No suspicious pulmonary nodules on the CT scan. Incidental CT findings: none ABDOMEN/PELVIS: There is a hypermetabolic lesion in the medial aspect of the dome of the liver (image 81) with SUV max equal 4.2. There is a subtle hypodensity on the comparison CT measuring 9 mm at this level . No clear additional focal hypermetabolic lesions identified. The hypermetabolic lesion in the dome liver corresponds to enhancing lesion on comparison MRI. Hypermetabolic lesion in the mid body of the pancreas with SUV max equal 5.1. Lesion measures 3.4 by 2.7 cm on CT portion exam No hypermetabolic  peripancreatic lymph nodes are identified. No hypermetabolic periportal nodes. Incidental CT findings: Nonobstructing LEFT renal calculus. Moderate volume stool throughout the colon. Tubal ligation clips noted. SKELETON: No focal hypermetabolic activity to suggest skeletal metastasis. Incidental CT findings: none IMPRESSION: 1. Hypermetabolic mass in the mid body the pancreas consistent primary pancreatic adenocarcinoma. 2. Hypermetabolic lesion in the dome the RIGHT hepatic lobe with associated low-attenuation lesion on CT consistent with hepatic metastasis. This region demonstrated enhancement on comparison MRI. 3. No hypermetabolic periportal or peripancreatic lymph nodes. Electronically Signed   By: Suzy Bouchard M.D.   On: 12/11/2020 17:42    PERFORMANCE STATUS (ECOG) : 2 - Symptomatic, <50% confined to bed  Review of Systems Unless otherwise noted, a complete review of systems is negative.  Physical Exam General: NAD Pulmonary: Unlabored Extremities: no edema, no joint deformities Skin: no rashes Neurological: Weakness but otherwise nonfocal  IMPRESSION: Patient presented in the clinic for follow-up today after starting her on Zoloft last week.  Introduced palliative care services and attempted establish therapeutic rapport.  Patient reports that she has had marked increase in fatigue today.  She is not felt like doing much.  She remains functionally independent.  Appetite has been chronically poor.  Discussed importance of maintaining fluid intake as well as foods high in calories/protein.  Patient was supposed to see genetics and nutrition today but declined and would like to reschedule those appointments.  She has not had significant changes in her mood after starting her sertraline.  However, I reminded her that it can take weeks for full effect.  I encouraged her to continue daily dosing.  We discussed general practices with fatigue management including maximizing nighttime sleep,  good sleep hygiene, importance of exercise, and maintaining caloric intake.  As patient only really having severe fatigue today without any other acute symptoms (fever, chills, UTI symptoms, cough, shortness of breath, nausea/vomiting/diarrhea), would recommend monitoring for now..  She says at baseline, she lives at home with her husband.  However, she is functionally independent with all of her own care.  She still drives.  We will plan to follow.  Patient will benefit from future conversation regarding ACP planning.  PLAN: -Continue current  scope of treatment -Pending referrals for nutritionist and genetics -We will benefit from future conversation regarding ACP -Continue sertraline daily -RTC 2 weeks   Patient expressed understanding and was in agreement with this plan. She also understands that She can call the clinic at any time with any questions, concerns, or complaints.     Time Total: 15 minutes  Visit consisted of counseling and education dealing with the complex and emotionally intense issues of symptom management and palliative care in the setting of serious and potentially life-threatening illness.Greater than 50%  of this time was spent counseling and coordinating care related to the above assessment and plan.  Signed by: Altha Harm, PhD, NP-C

## 2021-01-02 NOTE — Progress Notes (Signed)
She has not taken but b/p pill and ultram today. She went to sleep last night and got up in just enough time to get to this appt. She is achy and pain in hips and femur bilateral. She take ultram tid every day. She is fatigued. She does not feel light headed or dizzy.

## 2021-01-02 NOTE — Progress Notes (Signed)
Nutrition  Patient requesting nutrition appointment be rescheduled.  Message sent to scheduling to offer another appointment.  Kemisha Bonnette B. Zenia Resides, [place], [place] Registered Dietitian [phone] (mobile)

## 2021-01-06 ENCOUNTER — Telehealth: Payer: Self-pay | Admitting: *Deleted

## 2021-01-06 MED ORDER — GABAPENTIN 100 MG PO CAPS: 100.0000 mg | ORAL_CAPSULE | Freq: Every day | ORAL | 2 refills | Status: AC

## 2021-01-06 MED ORDER — HYDROCODONE-ACETAMINOPHEN 5-325 MG PO TABS: 1.0000 | ORAL_TABLET | Freq: Four times a day (QID) | ORAL | 0 refills | Status: DC | PRN

## 2021-01-06 NOTE — Telephone Encounter (Signed)
Patient called reporting severe fatigue to the point that she can hardly move and pain from DDD which Tramadol is not controlling. She requests a return call.

## 2021-01-06 NOTE — Telephone Encounter (Signed)
Merrily Pew can you call?

## 2021-01-06 NOTE — Telephone Encounter (Signed)
I called and spoke with patient by phone.  She reports chronic low back pain from degenerative disc disease.  Pain is characterized as burning and at times radiates down both legs.  Pain is keeping her awake at night and is unrelieved by use of tramadol.  Patient also endorses severe fatigue.  She is not sleeping well at night.  Will rotate her from tramadol to as needed Norco for pain.  We will also start her on low-dose gabapentin at bedtime, which may help pain and facilitate sleep.  Also suggested that she could try American ginseng 2000 mg daily for cancer related fatigue.

## 2021-01-08 ENCOUNTER — Telehealth: Payer: Self-pay | Admitting: *Deleted

## 2021-01-08 DIAGNOSIS — C787 Secondary malignant neoplasm of liver and intrahepatic bile duct: Secondary | ICD-10-CM

## 2021-01-08 DIAGNOSIS — C259 Malignant neoplasm of pancreas, unspecified: Secondary | ICD-10-CM

## 2021-01-08 NOTE — Telephone Encounter (Signed)
Patient called stating that there is no way that she can make until her appointment Monday and that she needs help. She is hurting and she is very weak. Please advise

## 2021-01-08 NOTE — Telephone Encounter (Signed)
I called and spoke with patient. She reports lower back/bilat hip pains which she attributes to DDD. She says that Norco is significantly helping and is lowering pain from 8/10 to 1/10 but is short lived and not lasting the full six hours. I suggested that she increase the frequency of the Norco to every 4 hours as needed. She may benefit from starting on a long acting opioid to better control pain around the clock.   Patient's primary concern today is profound fatigue. She is laying in bed and feels little energy to do anything. She reports adequate oral intake and denies other symptomatic complaints such as fever, chills, n/v/d/c, urinary sx. She started the ginseng but cannot tell that it is helping.   Discussed with Dr. Janese Banks. Patient is scheduled for chemotherapy next week but will plan to delay this by at least a week. Patient may require attenuated dosing due to poor tolerance.   Will plan to bring her in tomorrow for labs and [facility]. She may benefit from IVFs and steroids for fatigue.   Plan: -Increase frequency of Norco to 5-325mg  Q4H PRN -Continue daily bowel regimen -Consider MS Contin 15mg  Q12H -Consider starting dexamethasone 4mg  daily x 1 week -[facility] tomorrow -Labs: CBC, CMP

## 2021-01-09 ENCOUNTER — Ambulatory Visit: Payer: Medicare PPO

## 2021-01-09 ENCOUNTER — Other Ambulatory Visit: Payer: Self-pay

## 2021-01-09 ENCOUNTER — Ambulatory Visit: Payer: Medicare PPO | Admitting: Oncology

## 2021-01-09 ENCOUNTER — Encounter: Payer: Self-pay | Admitting: Nurse Practitioner

## 2021-01-09 ENCOUNTER — Inpatient Hospital Stay: Payer: Medicare PPO

## 2021-01-09 ENCOUNTER — Other Ambulatory Visit: Payer: Medicare PPO

## 2021-01-09 ENCOUNTER — Inpatient Hospital Stay (HOSPITAL_BASED_OUTPATIENT_CLINIC_OR_DEPARTMENT_OTHER): Payer: Medicare PPO | Admitting: Nurse Practitioner

## 2021-01-09 ENCOUNTER — Telehealth: Payer: Self-pay

## 2021-01-09 VITALS — BP 152/86 | HR 99 | Temp 97.1°F | Resp 18 | Wt 139.4 lb

## 2021-01-09 VITALS — BP 142/80 | HR 84 | Temp 97.6°F | Resp 18

## 2021-01-09 DIAGNOSIS — G893 Neoplasm related pain (acute) (chronic): Secondary | ICD-10-CM

## 2021-01-09 DIAGNOSIS — C259 Malignant neoplasm of pancreas, unspecified: Secondary | ICD-10-CM

## 2021-01-09 DIAGNOSIS — E1122 Type 2 diabetes mellitus with diabetic chronic kidney disease: Secondary | ICD-10-CM | POA: Insufficient documentation

## 2021-01-09 DIAGNOSIS — Z5189 Encounter for other specified aftercare: Secondary | ICD-10-CM | POA: Diagnosis not present

## 2021-01-09 DIAGNOSIS — Z5111 Encounter for antineoplastic chemotherapy: Secondary | ICD-10-CM | POA: Diagnosis not present

## 2021-01-09 DIAGNOSIS — E860 Dehydration: Secondary | ICD-10-CM

## 2021-01-09 DIAGNOSIS — Z95828 Presence of other vascular implants and grafts: Secondary | ICD-10-CM

## 2021-01-09 HISTORY — DX: Type 2 diabetes mellitus with diabetic chronic kidney disease: E11.22

## 2021-01-09 LAB — CBC WITH DIFFERENTIAL/PLATELET
Abs Immature Granulocytes: 0.03 10*3/uL (ref 0.00–0.07)
Basophils Absolute: 0 10*3/uL (ref 0.0–0.1)
Basophils Relative: 0 %
Eosinophils Absolute: 0.2 10*3/uL (ref 0.0–0.5)
Eosinophils Relative: 2 %
HCT: 39.1 % (ref 36.0–46.0)
Hemoglobin: 12.6 g/dL (ref 12.0–15.0)
Immature Granulocytes: 0 %
Lymphocytes Relative: 24 %
Lymphs Abs: 1.9 10*3/uL (ref 0.7–4.0)
MCH: 27.3 pg (ref 26.0–34.0)
MCHC: 32.2 g/dL (ref 30.0–36.0)
MCV: 84.6 fL (ref 80.0–100.0)
Monocytes Absolute: 0.3 10*3/uL (ref 0.1–1.0)
Monocytes Relative: 4 %
Neutro Abs: 5.7 10*3/uL (ref 1.7–7.7)
Neutrophils Relative %: 70 %
Platelets: 162 10*3/uL (ref 150–400)
RBC: 4.62 MIL/uL (ref 3.87–5.11)
RDW: 13 % (ref 11.5–15.5)
WBC: 8.1 10*3/uL (ref 4.0–10.5)
nRBC: 0 % (ref 0.0–0.2)

## 2021-01-09 LAB — COMPREHENSIVE METABOLIC PANEL
ALT: 16 U/L (ref 0–44)
AST: 13 U/L — ABNORMAL LOW (ref 15–41)
Albumin: 3.7 g/dL (ref 3.5–5.0)
Alkaline Phosphatase: 58 U/L (ref 38–126)
Anion gap: 10 (ref 5–15)
BUN: 36 mg/dL — ABNORMAL HIGH (ref 8–23)
CO2: 26 mmol/L (ref 22–32)
Calcium: 9.2 mg/dL (ref 8.9–10.3)
Chloride: 98 mmol/L (ref 98–111)
Creatinine, Ser: 1.39 mg/dL — ABNORMAL HIGH (ref 0.44–1.00)
GFR, Estimated: 39 mL/min — ABNORMAL LOW (ref >60)
Glucose, Bld: 334 mg/dL — ABNORMAL HIGH (ref 70–99)
Potassium: 3.9 mmol/L (ref 3.5–5.1)
Sodium: 134 mmol/L — ABNORMAL LOW (ref 135–145)
Total Bilirubin: 0.6 mg/dL (ref 0.3–1.2)
Total Protein: 6.7 g/dL (ref 6.5–8.1)

## 2021-01-09 MED ORDER — MORPHINE SULFATE ER 15 MG PO TBCR: 15.0000 mg | EXTENDED_RELEASE_TABLET | Freq: Two times a day (BID) | ORAL | 0 refills | Status: AC

## 2021-01-09 MED ORDER — SODIUM CHLORIDE 0.9 % IV SOLN
Freq: Once | INTRAVENOUS | Status: AC
  Filled 2021-01-09: qty 250

## 2021-01-09 MED ORDER — MORPHINE SULFATE (PF) 2 MG/ML IV SOLN
2.0000 mg | Freq: Once | INTRAVENOUS | Status: AC
Start: 2021-01-09 — End: 2021-01-09
  Administered 2021-01-09: 2 mg via INTRAVENOUS
  Filled 2021-01-09: qty 1

## 2021-01-09 MED ORDER — HEPARIN SOD (PORK) LOCK FLUSH 100 UNIT/ML IV SOLN
500.0000 [IU] | Freq: Once | INTRAVENOUS | Status: AC
  Administered 2021-01-09: 500 [IU] via INTRAVENOUS
  Filled 2021-01-09: qty 5

## 2021-01-09 MED ORDER — HEPARIN SOD (PORK) LOCK FLUSH 100 UNIT/ML IV SOLN
INTRAVENOUS | Status: AC
  Filled 2021-01-09: qty 5

## 2021-01-09 MED ORDER — DEXAMETHASONE SODIUM PHOSPHATE 10 MG/ML IJ SOLN
10.0000 mg | Freq: Once | INTRAMUSCULAR | Status: AC
  Administered 2021-01-09: 10 mg via INTRAVENOUS

## 2021-01-09 MED ORDER — DEXAMETHASONE 4 MG PO TABS: 4.0000 mg | ORAL_TABLET | Freq: Every day | ORAL | 0 refills | Status: DC

## 2021-01-09 NOTE — Telephone Encounter (Signed)
[facility] CDx report sent to HIM for scanning to medical record.

## 2021-01-09 NOTE — Progress Notes (Signed)
Symptom Management [place]  [phone] [phone]  Patient Care Team: Dion Body, MD as PCP - General (Family Medicine) Clent Jacks, RN as Oncology Nurse Navigator   Name of the patient: Rhonda Day  [id number]  Nov 21, 1940   Date of visit: 01/09/21  Diagnosis- Pancreatic Cancer  Chief complaint/ Reason for visit- malaise, pain, loss of appetite  Heme/Onc history:  Oncology History  Pancreatic cancer metastasized to liver ([place])  12/13/2020 Initial Diagnosis   Pancreatic cancer metastasized to liver [facility])   12/13/2020 Cancer Staging   Staging form: Exocrine Pancreas, AJCC 8th Edition - Clinical stage from 12/13/2020: Stage IV (cT2, cN0, cM1) - Signed by Sindy Guadeloupe, MD on 12/13/2020 Total positive nodes: 0   12/30/2020 -  Chemotherapy    Patient is on Treatment Plan: PANCREAS MODIFIED FOLFIRINOX Q14D X 4 CYCLES        Interval history-patient is 80 year old female diagnosed with pancreatic cancer status post cycle 1 of modified FOLFIRINOX chemotherapy on 12/30/2020.  She presents to symptom management for complaints of severe fatigue since receiving her chemotherapy.  She has been primarily sedentary which is exacerbated the severe pain in her back with known history of degenerative disc disease.  Describes as burning pain that radiates down both legs and keeps her awake at night.  Not sleeping well due to pain.  Previously took tramadol and was rotated to [facility] which has had more improvement.  She was also started on American ginseng for cancer related fatigue.  Today, she says pain ranges from 1-8 in intensity.  Improves with Norco but only lasts about 4 hours.  Says she is too tired to eat or drink.  Concerned about taking additional chemotherapy due to side effects.  No vomiting or nausea.  No chest pain or shortness of breath.  No urinary complaints.  No falls or dizziness.  Review of systems- Review of  Systems  Constitutional: Positive for malaise/fatigue and weight loss. Negative for chills, diaphoresis and fever.  HENT: Negative for congestion, ear discharge, ear pain, hearing loss, nosebleeds, sinus pain, sore throat and tinnitus.   Eyes: Negative for blurred vision, double vision, pain and redness.  Respiratory: Negative for cough, hemoptysis, sputum production, shortness of breath, wheezing and stridor.   Cardiovascular: Negative for chest pain, palpitations and leg swelling.  Gastrointestinal: Negative for abdominal pain, blood in stool, constipation, diarrhea, melena, nausea and vomiting.  Genitourinary: Negative for dysuria and urgency.  Musculoskeletal: Positive for back pain. Negative for falls, joint pain and myalgias.  Skin: Negative for itching and rash.  Neurological: Positive for weakness. Negative for dizziness, tingling, sensory change, loss of consciousness and headaches.  Endo/Heme/Allergies: Negative for environmental allergies. Does not bruise/bleed easily.  Psychiatric/Behavioral: Positive for depression. The patient has insomnia. The patient is not nervous/anxious.   All other systems reviewed and are negative.    Allergies  Allergen Reactions  . Sulfa Antibiotics Rash    Other reaction(s): RASH Other reaction(s): RASH     Past Medical History:  Diagnosis Date  . Bladder infection   . Diabetes mellitus without complication ([place])   . Hypertension   . Pancreatic cancer [facility])    with liver mass    Past Surgical History:  Procedure Laterality Date  . BACK SURGERY    . PORTA CATH INSERTION N/A 12/27/2020   Procedure: PORTA CATH INSERTION;  Surgeon: Algernon Huxley, MD;  Location: [facility];  Service: Cardiovascular;  Laterality: N/A;  .  TUBAL LIGATION      Social History   Socioeconomic History  . Marital status: Divorced    Spouse name: Not on file  . Number of children: Not on file  . Years of education: Not on file  . Highest education  level: Not on file  Occupational History  . Not on file  Tobacco Use  . Smoking status: Former Smoker    Quit date: 11/20/1979    Years since quitting: 41.1  . Smokeless tobacco: Never Used  Vaping Use  . Vaping Use: Never used  Substance and Sexual Activity  . Alcohol use: No    Alcohol/week: 0.0 standard drinks  . Drug use: No  . Sexual activity: Never  Other Topics Concern  . Not on file  Social History Narrative  . Not on file   Social Determinants of Health   Financial Resource Strain: Not on file  Food Insecurity: Not on file  Transportation Needs: Not on file  Physical Activity: Not on file  Stress: Not on file  Social Connections: Not on file  Intimate Partner Violence: Not on file    Family History  Problem Relation Age of Onset  . Kidney cancer Father   . Breast cancer Neg Hx      Current Outpatient Medications:  .  diltiazem (TIAZAC) 180 MG 24 hr capsule, Take by mouth., Disp: , Rfl:  .  HYDROcodone-acetaminophen (NORCO) 5-325 MG tablet, Take 1 tablet by mouth every 6 (six) hours as needed for moderate pain., Disp: 45 tablet, Rfl: 0 .  ascorbic acid (VITAMIN C) 1000 MG tablet, Take by mouth. (Patient not taking: No sig reported), Disp: , Rfl:  .  azelastine (ASTELIN) 0.1 % nasal spray, azelastine 137 mcg (0.1 %) nasal spray aerosol (Patient not taking: No sig reported), Disp: , Rfl:  .  azelastine (OPTIVAR) 0.05 % ophthalmic solution, , Disp: , Rfl: 3 .  b complex vitamins capsule, Take 1 capsule by mouth daily. (Patient not taking: No sig reported), Disp: , Rfl:  .  beta carotene 10000 UNIT capsule, Take by mouth. (Patient not taking: No sig reported), Disp: , Rfl:  .  dexamethasone (DECADRON) 4 MG tablet, Take 2 tablets (8 mg total) by mouth daily. Start the day after chemotherapy for 3 days. Take with food. (Patient not taking: No sig reported), Disp: 8 tablet, Rfl: 5 .  estradiol (ESTRACE) 0.5 MG tablet, Take 1 tablet (0.5 mg total) by mouth daily. (Patient  not taking: No sig reported), Disp: 30 tablet, Rfl: 12 .  ferrous sulfate 325 (65 FE) MG EC tablet, Take by mouth. (Patient not taking: No sig reported), Disp: , Rfl:  .  gabapentin (NEURONTIN) 100 MG capsule, Take 1 capsule (100 mg total) by mouth at bedtime. (Patient not taking: Reported on 01/09/2021), Disp: 30 capsule, Rfl: 2 .  ipratropium (ATROVENT) 0.06 % nasal spray, , Disp: , Rfl:  .  lidocaine-prilocaine (EMLA) cream, Apply to affected area once (Patient not taking: Reported on 01/09/2021), Disp: 30 g, Rfl: 3 .  loperamide (IMODIUM A-D) 2 MG tablet, Take 2 at onset of diarrhea, then 1 every 2hrs until 12hr without a BM. May take 2 tab every 4hrs at bedtime. If diarrhea recurs repeat. (Patient not taking: No sig reported), Disp: 100 tablet, Rfl: 1 .  LORazepam (ATIVAN) 0.5 MG tablet, Take 1 tablet (0.5 mg total) by mouth every 8 (eight) hours. (Patient not taking: No sig reported), Disp: 60 tablet, Rfl: 0 .  losartan (COZAAR) 100  MG tablet, Take 100 mg by mouth daily. (Patient not taking: No sig reported), Disp: , Rfl:  .  metFORMIN (GLUCOPHAGE-XR) 500 MG 24 hr tablet, take 1 tablet by mouth once daily with DINNER (Patient not taking: No sig reported), Disp: , Rfl: 0 .  Omega-3 Fatty Acids (FISH OIL PO), Take by mouth. (Patient not taking: No sig reported), Disp: , Rfl:  .  ondansetron (ZOFRAN) 8 MG tablet, Take 1 tablet (8 mg total) by mouth 2 (two) times daily as needed. Start on day 3 after chemotherapy. (Patient not taking: No sig reported), Disp: 30 tablet, Rfl: 1 .  ONE TOUCH ULTRA TEST test strip, , Disp: , Rfl: 1 .  ONETOUCH DELICA LANCETS 32G MISC, 1 each by XX route as directed. Check CBG's fasting once daily. Dx: E11.9 (Patient not taking: No sig reported), Disp: , Rfl:  .  prochlorperazine (COMPAZINE) 10 MG tablet, Take 1 tablet (10 mg total) by mouth every 6 (six) hours as needed (Nausea or vomiting). (Patient not taking: No sig reported), Disp: 30 tablet, Rfl: 1 .  sertraline  (ZOLOFT) 50 MG tablet, Take 1 tablet (50 mg total) by mouth daily. (Patient not taking: No sig reported), Disp: 30 tablet, Rfl: 2  Physical exam:  Vitals:   01/09/21 1113  BP: (!) 152/86  Pulse: 99  Resp: 18  Temp: (!) 97.1 F (36.2 C)  TempSrc: Tympanic  SpO2: 97%  Weight: 139 lb 6.4 oz (63.2 kg)   Physical Exam Constitutional:      Appearance: She is well-developed. She is not ill-appearing (laying in recliner. fatigued appearing).  HENT:     Head: Normocephalic and atraumatic.     Nose: Nose normal.     Mouth/Throat:     Pharynx: No oropharyngeal exudate.  Eyes:     General: No scleral icterus.    Conjunctiva/sclera: Conjunctivae normal.  Cardiovascular:     Rate and Rhythm: Normal rate and regular rhythm.  Pulmonary:     Effort: Pulmonary effort is normal. No respiratory distress.     Breath sounds: Normal breath sounds.  Abdominal:     General: Bowel sounds are normal. There is no distension.     Palpations: Abdomen is soft.     Tenderness: There is no abdominal tenderness. There is no guarding.  Musculoskeletal:        General: Normal range of motion.     Cervical back: Normal range of motion and neck supple.  Skin:    General: Skin is warm and dry.  Neurological:     General: No focal deficit present.     Mental Status: She is alert and oriented to person, place, and time.  Psychiatric:        Mood and Affect: Mood normal.        Behavior: Behavior normal.      CMP Latest Ref Rng & Units 01/09/2021  Glucose 70 - 99 mg/dL 334(H)  BUN 8 - 23 mg/dL 36(H)  Creatinine 0.44 - 1.00 mg/dL 1.39(H)  Sodium 135 - 145 mmol/L 134(L)  Potassium 3.5 - 5.1 mmol/L 3.9  Chloride 98 - 111 mmol/L 98  CO2 22 - 32 mmol/L 26  Calcium 8.9 - 10.3 mg/dL 9.2  Total Protein 6.5 - 8.1 g/dL 6.7  Total Bilirubin 0.3 - 1.2 mg/dL 0.6  Alkaline Phos 38 - 126 U/L 58  AST 15 - 41 U/L 13(L)  ALT 0 - 44 U/L 16   CBC Latest Ref Rng & Units 01/09/2021  WBC 4.0 - 10.5 K/uL 8.1   Hemoglobin 12.0 - 15.0 g/dL 12.6  Hematocrit 36.0 - 46.0 % 39.1  Platelets 150 - 400 K/uL 162    No images are attached to the encounter.  PERIPHERAL VASCULAR CATHETERIZATION  Result Date: 12/27/2020 See op note  NM PET Image Initial (PI) Skull Base To Thigh  Result Date: 12/11/2020 CLINICAL DATA:  Initial treatment strategy for pancreatic cancer. EXAM: NUCLEAR MEDICINE PET SKULL BASE TO THIGH TECHNIQUE: 8.3 mCi F-18 FDG was injected intravenously. Full-ring PET imaging was performed from the skull base to thigh after the radiotracer. CT data was obtained and used for attenuation correction and anatomic localization. Fasting blood glucose: 184 mg/dl COMPARISON:  MRI 11/05/2018 FINDINGS: Mediastinal blood pool activity: SUV max 2.8 Liver activity: SUV max 3.3 NECK: No hypermetabolic lymph nodes in the neck. Incidental CT findings: none CHEST: No hypermetabolic mediastinal or hilar nodes. No suspicious pulmonary nodules on the CT scan. Incidental CT findings: none ABDOMEN/PELVIS: There is a hypermetabolic lesion in the medial aspect of the dome of the liver (image 81) with SUV max equal 4.2. There is a subtle hypodensity on the comparison CT measuring 9 mm at this level . No clear additional focal hypermetabolic lesions identified. The hypermetabolic lesion in the dome liver corresponds to enhancing lesion on comparison MRI. Hypermetabolic lesion in the mid body of the pancreas with SUV max equal 5.1. Lesion measures 3.4 by 2.7 cm on CT portion exam No hypermetabolic peripancreatic lymph nodes are identified. No hypermetabolic periportal nodes. Incidental CT findings: Nonobstructing LEFT renal calculus. Moderate volume stool throughout the colon. Tubal ligation clips noted. SKELETON: No focal hypermetabolic activity to suggest skeletal metastasis. Incidental CT findings: none IMPRESSION: 1. Hypermetabolic mass in the mid body the pancreas consistent primary pancreatic adenocarcinoma. 2. Hypermetabolic  lesion in the dome the RIGHT hepatic lobe with associated low-attenuation lesion on CT consistent with hepatic metastasis. This region demonstrated enhancement on comparison MRI. 3. No hypermetabolic periportal or peripancreatic lymph nodes. Electronically Signed   By: Suzy Bouchard M.D.   On: 12/11/2020 17:42    Assessment and plan- Patient is a 80 y.o. female diagnosed with metastatic pancreatic cancer status post cycle 1 of modified FOLFIRINOX who presents to symptom management clinic for  1. Fatigue- secondary to malignancy and post chemotherapy. Decadron 10 mg IV in clinic. Start decadron 4 mg daily tomorrow for palliative effect. Continue american ginseng. Treat pain to improve her comfort and rest (see below)  2. Pain- Morphine in clinic with improvemnet in symptoms. Add long acting pain medication with MS contin 15 mg q12h. Continue norco 5-325 q4h prn for breakthrough pain. Bowel prophylaxis to maintain stool every to every other day.   3. Poor oral intake- secondary to fatigue and pain. Labs today reviewed. IV fluids in clinic.   4. Diabetes- monitor sugars closely in setting of steroids.   Follow up with Dr. Janese Banks on Monday for further evaluation. Patient wishes to hold chemotherapy that day due to side effects. I've encouraged her to discuss her concerns with Dr. Janese Banks.    Visit Diagnosis 1. Convalescence following chemotherapy   2. Cancer associated pain     Patient expressed understanding and was in agreement with this plan. She also understands that She can call clinic at any time with any questions, concerns, or complaints.   Thank you for allowing me to participate in the care of this very pleasant patient.   Beckey Rutter, DNP, AGNP-C [place] [facility] [place]  CC: Dr. Janese Banks

## 2021-01-09 NOTE — Progress Notes (Signed)
Pt received IV hydration with supportive meds dexamethasone and morhine to relieve symptoms. Pt feeling some better at time of discharge. Pt escorted to lobby via wheelchair . Husband driving pt home.

## 2021-01-09 NOTE — Telephone Encounter (Signed)
Lab and Symptom Management Clinic appointments added

## 2021-01-10 ENCOUNTER — Telehealth: Payer: Self-pay

## 2021-01-10 DIAGNOSIS — Z5189 Encounter for other specified aftercare: Secondary | ICD-10-CM

## 2021-01-10 DIAGNOSIS — G893 Neoplasm related pain (acute) (chronic): Secondary | ICD-10-CM

## 2021-01-10 HISTORY — DX: Encounter for other specified aftercare: Z51.89

## 2021-01-10 HISTORY — DX: Neoplasm related pain (acute) (chronic): G89.3

## 2021-01-13 ENCOUNTER — Inpatient Hospital Stay (HOSPITAL_BASED_OUTPATIENT_CLINIC_OR_DEPARTMENT_OTHER): Payer: Medicare PPO | Admitting: Hospice and Palliative Medicine

## 2021-01-13 ENCOUNTER — Inpatient Hospital Stay: Payer: Medicare PPO

## 2021-01-13 ENCOUNTER — Inpatient Hospital Stay (HOSPITAL_BASED_OUTPATIENT_CLINIC_OR_DEPARTMENT_OTHER): Payer: Medicare PPO | Admitting: Oncology

## 2021-01-13 ENCOUNTER — Encounter: Payer: Self-pay | Admitting: Internal Medicine

## 2021-01-13 ENCOUNTER — Encounter: Payer: Self-pay | Admitting: Oncology

## 2021-01-13 VITALS — BP 137/62 | HR 97 | Resp 16

## 2021-01-13 VITALS — BP 75/50 | HR 43 | Temp 96.4°F | Resp 16 | Wt 136.8 lb

## 2021-01-13 DIAGNOSIS — C259 Malignant neoplasm of pancreas, unspecified: Secondary | ICD-10-CM | POA: Diagnosis not present

## 2021-01-13 DIAGNOSIS — Z515 Encounter for palliative care: Secondary | ICD-10-CM | POA: Diagnosis not present

## 2021-01-13 DIAGNOSIS — T451X5A Adverse effect of antineoplastic and immunosuppressive drugs, initial encounter: Secondary | ICD-10-CM

## 2021-01-13 DIAGNOSIS — C787 Secondary malignant neoplasm of liver and intrahepatic bile duct: Secondary | ICD-10-CM

## 2021-01-13 DIAGNOSIS — R5383 Other fatigue: Secondary | ICD-10-CM

## 2021-01-13 DIAGNOSIS — Z5111 Encounter for antineoplastic chemotherapy: Secondary | ICD-10-CM | POA: Diagnosis not present

## 2021-01-13 DIAGNOSIS — G893 Neoplasm related pain (acute) (chronic): Secondary | ICD-10-CM

## 2021-01-13 LAB — CBC WITH DIFFERENTIAL/PLATELET
Abs Immature Granulocytes: 0.06 10*3/uL (ref 0.00–0.07)
Basophils Absolute: 0 10*3/uL (ref 0.0–0.1)
Basophils Relative: 1 %
Eosinophils Absolute: 0.1 10*3/uL (ref 0.0–0.5)
Eosinophils Relative: 4 %
HCT: 35.7 % — ABNORMAL LOW (ref 36.0–46.0)
Hemoglobin: 11.3 g/dL — ABNORMAL LOW (ref 12.0–15.0)
Immature Granulocytes: 2 %
Lymphocytes Relative: 45 %
Lymphs Abs: 1.5 10*3/uL (ref 0.7–4.0)
MCH: 27.1 pg (ref 26.0–34.0)
MCHC: 31.7 g/dL (ref 30.0–36.0)
MCV: 85.6 fL (ref 80.0–100.0)
Monocytes Absolute: 0.7 10*3/uL (ref 0.1–1.0)
Monocytes Relative: 23 %
Neutro Abs: 0.8 10*3/uL — ABNORMAL LOW (ref 1.7–7.7)
Neutrophils Relative %: 25 %
Platelets: 215 10*3/uL (ref 150–400)
RBC: 4.17 MIL/uL (ref 3.87–5.11)
RDW: 13.2 % (ref 11.5–15.5)
Smear Review: NORMAL
WBC: 3.2 10*3/uL — ABNORMAL LOW (ref 4.0–10.5)
nRBC: 0 % (ref 0.0–0.2)

## 2021-01-13 LAB — COMPREHENSIVE METABOLIC PANEL
ALT: 14 U/L (ref 0–44)
AST: 12 U/L — ABNORMAL LOW (ref 15–41)
Albumin: 3.2 g/dL — ABNORMAL LOW (ref 3.5–5.0)
Alkaline Phosphatase: 55 U/L (ref 38–126)
Anion gap: 11 (ref 5–15)
BUN: 31 mg/dL — ABNORMAL HIGH (ref 8–23)
CO2: 26 mmol/L (ref 22–32)
Calcium: 8.9 mg/dL (ref 8.9–10.3)
Chloride: 98 mmol/L (ref 98–111)
Creatinine, Ser: 1.32 mg/dL — ABNORMAL HIGH (ref 0.44–1.00)
GFR, Estimated: 42 mL/min — ABNORMAL LOW (ref >60)
Glucose, Bld: 342 mg/dL — ABNORMAL HIGH (ref 70–99)
Potassium: 3.7 mmol/L (ref 3.5–5.1)
Sodium: 135 mmol/L (ref 135–145)
Total Bilirubin: 0.5 mg/dL (ref 0.3–1.2)
Total Protein: 6.2 g/dL — ABNORMAL LOW (ref 6.5–8.1)

## 2021-01-13 MED ORDER — HEPARIN SOD (PORK) LOCK FLUSH 100 UNIT/ML IV SOLN
INTRAVENOUS | Status: AC
  Filled 2021-01-13: qty 5

## 2021-01-13 MED ORDER — MORPHINE SULFATE (PF) 2 MG/ML IV SOLN
2.0000 mg | Freq: Once | INTRAVENOUS | Status: AC
Start: 2021-01-13 — End: 2021-01-13
  Administered 2021-01-13: 2 mg via INTRAVENOUS
  Filled 2021-01-13: qty 1

## 2021-01-13 MED ORDER — SODIUM CHLORIDE 0.9% FLUSH
10.0000 mL | Freq: Once | INTRAVENOUS | Status: DC
  Filled 2021-01-13: qty 10

## 2021-01-13 MED ORDER — HEPARIN SOD (PORK) LOCK FLUSH 100 UNIT/ML IV SOLN
500.0000 [IU] | Freq: Once | INTRAVENOUS | Status: AC
  Administered 2021-01-13: 500 [IU] via INTRAVENOUS
  Filled 2021-01-13: qty 5

## 2021-01-13 MED ORDER — SODIUM CHLORIDE 0.9 % IV SOLN
INTRAVENOUS | Status: DC
  Filled 2021-01-13 (×2): qty 250

## 2021-01-13 NOTE — Progress Notes (Signed)
Patient states no pain after receiving morphine in clinic. BP improved after 1 L NS. Escorted out via wheelchair

## 2021-01-13 NOTE — Progress Notes (Signed)
[place]  Telephone:([phone] [phone]   Name: Rhonda Day Date: 01/13/2021 MRN: [id number]  DOB: [birth year]  Patient Care Team: Dion Body, MD as PCP - General (Family Medicine) Clent Jacks, RN as Oncology Nurse Navigator    REASON FOR CONSULTATION: Rhonda Day is a 80 y.o. 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  Patient was found to have a pancreatic tail mass in December 2021.  She ultimately underwent EUS and biopsy with pathology consistent for adenocarcinoma.  Patient is referred to palliative care to help address goals and manage ongoing symptoms.   SOCIAL HISTORY:     reports that she quit smoking about 41 years ago. She has never used smokeless tobacco. She reports that she does not drink alcohol and does not use drugs.  Patient is married and lives at home with her husband.  She has no children.  ADVANCE DIRECTIVES:  None on file  CODE STATUS:   PAST MEDICAL HISTORY: Past Medical History:  Diagnosis Date  . Bladder infection   . Diabetes mellitus without complication ([place])   . Hypertension   . Pancreatic cancer ([place])    with liver mass    PAST SURGICAL HISTORY:  Past Surgical History:  Procedure Laterality Date  . BACK SURGERY    . PORTA CATH INSERTION N/A 12/27/2020   Procedure: PORTA CATH INSERTION;  Surgeon: Algernon Huxley, MD;  Location: [facility];  Service: Cardiovascular;  Laterality: N/A;  . TUBAL LIGATION      HEMATOLOGY/ONCOLOGY HISTORY:  Oncology History  Pancreatic cancer metastasized to liver ([place])  12/13/2020 Initial Diagnosis   Pancreatic cancer metastasized to liver ([place])   12/13/2020 Cancer Staging   Staging form: Exocrine Pancreas, AJCC 8th Edition - Clinical stage from 12/13/2020: Stage IV (cT2, cN0, cM1) - Signed by Sindy Guadeloupe, MD on  12/13/2020 Total positive nodes: 0   12/30/2020 -  Chemotherapy    Patient is on Treatment Plan: PANCREAS MODIFIED FOLFIRINOX Q14D X 4 CYCLES        ALLERGIES:  is allergic to sulfa antibiotics.  MEDICATIONS:  Current Outpatient Medications  Medication Sig Dispense Refill  . ascorbic acid (VITAMIN C) 1000 MG tablet Take by mouth. (Patient not taking: No sig reported)    . azelastine (ASTELIN) 0.1 % nasal spray azelastine 137 mcg (0.1 %) nasal spray aerosol (Patient not taking: No sig reported)    . azelastine (OPTIVAR) 0.05 % ophthalmic solution  (Patient not taking: No sig reported)  3  . b complex vitamins capsule Take 1 capsule by mouth daily. (Patient not taking: No sig reported)    . beta carotene 10000 UNIT capsule Take by mouth. (Patient not taking: No sig reported)    . dexamethasone (DECADRON) 4 MG tablet Take 2 tablets (8 mg total) by mouth daily. Start the day after chemotherapy for 3 days. Take with food. (Patient not taking: No sig reported) 8 tablet 5  . dexamethasone (DECADRON) 4 MG tablet Take 1 tablet (4 mg total) by mouth daily. (Patient not taking: Reported on 01/13/2021) 7 tablet 0  . diltiazem (TIAZAC) 180 MG 24 hr capsule Take by mouth. (Patient not taking: Reported on 01/13/2021)    . estradiol (ESTRACE) 0.5 MG tablet Take 1 tablet (0.5 mg total) by mouth daily. (Patient not taking: No sig reported) 30 tablet 12  . ferrous sulfate 325 (65 FE)  MG EC tablet Take by mouth. (Patient not taking: No sig reported)    . gabapentin (NEURONTIN) 100 MG capsule Take 1 capsule (100 mg total) by mouth at bedtime. (Patient not taking: No sig reported) 30 capsule 2  . HYDROcodone-acetaminophen (NORCO) 5-325 MG tablet Take 1 tablet by mouth every 6 (six) hours as needed for moderate pain. 45 tablet 0  . ipratropium (ATROVENT) 0.06 % nasal spray  (Patient not taking: No sig reported)    . lidocaine-prilocaine (EMLA) cream Apply to affected area once (Patient not taking: No sig reported)  30 g 3  . loperamide (IMODIUM A-D) 2 MG tablet Take 2 at onset of diarrhea, then 1 every 2hrs until 12hr without a BM. May take 2 tab every 4hrs at bedtime. If diarrhea recurs repeat. (Patient not taking: No sig reported) 100 tablet 1  . LORazepam (ATIVAN) 0.5 MG tablet Take 1 tablet (0.5 mg total) by mouth every 8 (eight) hours. (Patient not taking: No sig reported) 60 tablet 0  . losartan (COZAAR) 100 MG tablet Take 100 mg by mouth daily. (Patient not taking: No sig reported)    . metFORMIN (GLUCOPHAGE-XR) 500 MG 24 hr tablet take 1 tablet by mouth once daily with DINNER (Patient not taking: No sig reported)  0  . morphine (MS CONTIN) 15 MG 12 hr tablet Take 1 tablet (15 mg total) by mouth every 12 (twelve) hours. 60 tablet 0  . Omega-3 Fatty Acids (FISH OIL PO) Take by mouth. (Patient not taking: No sig reported)    . ondansetron (ZOFRAN) 8 MG tablet Take 1 tablet (8 mg total) by mouth 2 (two) times daily as needed. Start on day 3 after chemotherapy. (Patient not taking: No sig reported) 30 tablet 1  . ONE TOUCH ULTRA TEST test strip  (Patient not taking: No sig reported)  1  . ONETOUCH DELICA LANCETS 51Z MISC 1 each by XX route as directed. Check CBG's fasting once daily. Dx: E11.9 (Patient not taking: No sig reported)    . prochlorperazine (COMPAZINE) 10 MG tablet Take 1 tablet (10 mg total) by mouth every 6 (six) hours as needed (Nausea or vomiting). (Patient not taking: No sig reported) 30 tablet 1  . sertraline (ZOLOFT) 50 MG tablet Take 1 tablet (50 mg total) by mouth daily. (Patient not taking: No sig reported) 30 tablet 2   No current facility-administered medications for this visit.   Facility-Administered Medications Ordered in Other Visits  Medication Dose Route Frequency Provider Last Rate Last Admin  . 0.9 %  sodium chloride infusion   Intravenous Continuous Sindy Guadeloupe, MD 999 mL/hr at 01/13/21 0952 New Bag at 01/13/21 0952    VITAL SIGNS: LMP  (LMP Unknown)  There were no  vitals filed for this visit.  Estimated body mass index is 20.2 kg/m as calculated from the following:   Height as of 12/27/20: 5\' 9"  (1.753 m).   Weight as of an earlier encounter on 01/13/21: 136 lb 12.8 oz (62.1 kg).  LABS: CBC:    Component Value Date/Time   WBC 3.2 (L) 01/13/2021 0857   HGB 11.3 (L) 01/13/2021 0857   HCT 35.7 (L) 01/13/2021 0857   PLT 215 01/13/2021 0857   MCV 85.6 01/13/2021 0857   NEUTROABS 0.8 (L) 01/13/2021 0857   LYMPHSABS 1.5 01/13/2021 0857   MONOABS 0.7 01/13/2021 0857   EOSABS 0.1 01/13/2021 0857   BASOSABS 0.0 01/13/2021 0857   Comprehensive Metabolic Panel:    Component Value Date/Time  NA 135 01/13/2021 0857   K 3.7 01/13/2021 0857   CL 98 01/13/2021 0857   CO2 26 01/13/2021 0857   BUN 31 (H) 01/13/2021 0857   CREATININE 1.32 (H) 01/13/2021 0857   GLUCOSE 342 (H) 01/13/2021 0857   CALCIUM 8.9 01/13/2021 0857   AST 12 (L) 01/13/2021 0857   ALT 14 01/13/2021 0857   ALKPHOS 55 01/13/2021 0857   BILITOT 0.5 01/13/2021 0857   PROT 6.2 (L) 01/13/2021 0857   ALBUMIN 3.2 (L) 01/13/2021 0857    RADIOGRAPHIC STUDIES: PERIPHERAL VASCULAR CATHETERIZATION  Result Date: 12/27/2020 See op note   PERFORMANCE STATUS (ECOG) : 2 - Symptomatic, <50% confined to bed  Review of Systems Unless otherwise noted, a complete review of systems is negative.  Physical Exam General: NAD Pulmonary: Unlabored Extremities: no edema, no joint deformities Skin: no rashes Neurological: Weakness but otherwise nonfocal  IMPRESSION: Follow-up visit.  Patient seen with Dr. Janese Banks.  Since last chemotherapy treatment, patient has had profound fatigue.  She is also developed pain in the low back, hips, and occasional abdomen.  She denies other symptomatic complaints.  She says she spends most of the day in bed.  She is likely increasingly deconditioned.  Plan is to delay chemotherapy for several weeks and likely rotate to gemcitabine +/- Abraxane.  Patient reports  that the pain is improved on Norco and MS Contin.  Discussed constipation management with recommendation for senna daily as needed.  Patient says she normally has a bowel movement every 3 days or so even prior to starting pain medications.  Patient reports stable oral intake but she has lost about 10 pounds in past 1 to 2 weeks  I called and spoke with patient's husband.  He says that he is trying to get more help for patient at home.  He struggling with her physical care as her performance status has declined.  He is looking into aide services.  Hopefully, her fatigue will improve as effects from chemotherapy dissipate.  However, she is likely to be physically deconditioned.  Will order home health PT/OT/CNA.  We will also order her a walker to facilitate ambulation.  Husband asked about hospice services.  However, I explained that hospice would not yet be an option considering patient's goals are still aligned with active treatment.  Patient was recently started on oral dexamethasone to help with fatigue and appetite.     Durable Medical Equipment  (From admission, onward)         Start     Ordered   01/13/21 0000  For home use only DME 4 wheeled rolling walker with seat       Question:  Patient needs a walker to treat with the following condition  Answer:  Weakness   01/13/21 1012           PLAN: -Continue current scope of treatment -Referral for home health PT/OT/CNA -DME: Rollator walker -Continue Norco/MS Contin -Daily bowel regimen -Will need to address ACP when patient is symptomatically feeling better -RTC next week  Case and plan discussed with Dr. Janese Banks  Patient expressed understanding and was in agreement with this plan. She also understands that She can call the clinic at any time with any questions, concerns, or complaints.     Time Total: 25 minutes  Visit consisted of counseling and education dealing with the complex and emotionally intense issues of symptom  management and palliative care in the setting of serious and potentially life-threatening illness.Greater than 50%  of  this time was spent counseling and coordinating care related to the above assessment and plan.  Signed by: Altha Harm, PhD, NP-C

## 2021-01-13 NOTE — Progress Notes (Signed)
Hematology/Oncology Consult note [facility]  [phone] [phone]  Patient Care Team: Dion Body, MD as PCP - General (Family Medicine) Clent Jacks, RN as Oncology Nurse Navigator   Name of the patient: Rhonda Day  [id number]  [birth year]   Date of visit: 01/13/21  Diagnosis- metastatic pancreatic cancer with liver metastases   Chief complaint/ Reason for visit-on treatment assessment prior to cycle 2 of modified FOLFIRINOX chemotherapy  Heme/Onc history: Patient is a 80 year old female who underwent ultrasound abdomen in December 2021 which showed hypoechoic mass in the pancreatic tail measuring 2.1 x 1.8 x 4.5 cm which was not seen on prior CT scans on MRI exams a year ago. This was followed by a CT abdomen and pelvis with contrast which showed ill-defined hypervascular lesion in segment 8 of liver measuring 1.6 x 1.4 cm concerning for metastases. 2 other lesions 1.7 x 1 cm and 1.5 x 0.8 cm were also noted. Large mass in the body of pancreas measuring 5.6 x 2.2 x 2.9 cm. The lesion causes obstruction of the pancreatic duct resulting in ductal dilatation throughout the tail of the pancreas. 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 Splenic vein appears chronically occluded. Lesion also comes in contact with splenic artery which remains patent. This was followed by MRI abdomen which again showed similar findings. Multiple foci of restricted diffusion with hyperenhancement noted in the right hepatic lobe. Prominent portocaval lymph node seen for local nodal metastases along with a 5.3 cm body and tail pancreatic mass  Case was discussed at tumor board and lab was liver biopsy 2 weeks after patient initial Covid test that was positive. However at the day of the biopsy this lesion was in close association with other  vascular structures and given the potential higher risks liver biopsy was aborted and EUS was recommended. She is currently on Cardizem.  Patient had a EUS at [facility] Dr. Mont Dutton. Pathology was consistent with adenocarcinoma. Patient had a PET scan on 12/11/2020 which showed a hypermetabolic lesion in the medial aspect of the liver with an SUV of 4.2 there is a subtle hypodensity on the comparison CT measuring 9 mm at this level. The hypermetabolic lesion in the dome of the liver corresponds to the enhancing lesion on the comparison MRI. 3.4 x 2.7 cm lesion in the mid body of pancreas with an SUV of 5.1. No hypermetabolic peripancreatic lymph nodes.   Interval history-patient reports significant fatigue since her first chemotherapy.  She is unable to carry on her ADLs.  She has not had any falls.  Denies any significant nausea.  Reports occasional cramping abdominal pain as well as back pain for which she is currently on long-acting morphine as well as as needed Norco which she states has been helping her.  She has a bowel movement every 2 to 3 days which is normal for her.  ECOG PS- 2 Pain scale- 4 Opioid associated constipation- no  Review of systems- Review of Systems  Constitutional: Positive for malaise/fatigue. Negative for chills, fever and weight loss.  HENT: Negative for congestion, ear discharge and nosebleeds.   Eyes: Negative for blurred vision.  Respiratory: Negative for cough, hemoptysis, sputum production, shortness of breath and wheezing.   Cardiovascular: Negative for chest pain, palpitations, orthopnea and claudication.  Gastrointestinal: Negative for abdominal pain, blood in stool, constipation, diarrhea, heartburn, melena, nausea and vomiting.  Genitourinary: Negative for dysuria, flank pain, frequency, hematuria and urgency.  Musculoskeletal: Positive for back pain. Negative for joint pain and myalgias.  Skin: Negative for rash.  Neurological: Negative for dizziness,  tingling, focal weakness, seizures, weakness and headaches.  Endo/Heme/Allergies: Does not bruise/bleed easily.  Psychiatric/Behavioral: Negative for depression and suicidal ideas. The patient does not have insomnia.        Allergies  Allergen Reactions  . Sulfa Antibiotics Rash    Other reaction(s): RASH Other reaction(s): RASH      Past Medical History:  Diagnosis Date  . Bladder infection   . Diabetes mellitus without complication ([place])   . Hypertension   . Pancreatic cancer [facility])    with liver mass     Past Surgical History:  Procedure Laterality Date  . BACK SURGERY    . PORTA CATH INSERTION N/A 12/27/2020   Procedure: PORTA CATH INSERTION;  Surgeon: Algernon Huxley, MD;  Location: [place] [facility];  Service: Cardiovascular;  Laterality: N/A;  . TUBAL LIGATION      Social History   Socioeconomic History  . Marital status: Married    Spouse name: Not on file  . Number of children: Not on file  . Years of education: Not on file  . Highest education level: Not on file  Occupational History  . Not on file  Tobacco Use  . Smoking status: Former Smoker    Quit date: 11/20/1979    Years since quitting: 41.1  . Smokeless tobacco: Never Used  Vaping Use  . Vaping Use: Never used  Substance and Sexual Activity  . Alcohol use: No    Alcohol/week: 0.0 standard drinks  . Drug use: No  . Sexual activity: Never  Other Topics Concern  . Not on file  Social History Narrative  . Not on file   Social Determinants of Health   Financial Resource Strain: Not on file  Food Insecurity: Not on file  Transportation Needs: Not on file  Physical Activity: Not on file  Stress: Not on file  Social Connections: Not on file  Intimate Partner Violence: Not on file    Family History  Problem Relation Age of Onset  . Kidney cancer Father   . Breast cancer Neg Hx      Current Outpatient Medications:  .  HYDROcodone-acetaminophen (NORCO) 5-325 MG tablet, Take 1 tablet by  mouth every 6 (six) hours as needed for moderate pain., Disp: 45 tablet, Rfl: 0 .  morphine (MS CONTIN) 15 MG 12 hr tablet, Take 1 tablet (15 mg total) by mouth every 12 (twelve) hours., Disp: 60 tablet, Rfl: 0 .  ascorbic acid (VITAMIN C) 1000 MG tablet, Take by mouth. (Patient not taking: No sig reported), Disp: , Rfl:  .  azelastine (ASTELIN) 0.1 % nasal spray, azelastine 137 mcg (0.1 %) nasal spray aerosol (Patient not taking: No sig reported), Disp: , Rfl:  .  azelastine (OPTIVAR) 0.05 % ophthalmic solution, , Disp: , Rfl: 3 .  b complex vitamins capsule, Take 1 capsule by mouth daily. (Patient not taking: No sig reported), Disp: , Rfl:  .  beta carotene 10000 UNIT capsule, Take by mouth. (Patient not taking: No sig reported), Disp: , Rfl:  .  dexamethasone (DECADRON) 4 MG tablet, Take 2 tablets (8 mg total) by mouth daily. Start the day after chemotherapy for 3 days. Take with food. (Patient not taking: No sig reported), Disp: 8 tablet, Rfl: 5 .  dexamethasone (DECADRON) 4 MG tablet, Take 1  tablet (4 mg total) by mouth daily. (Patient not taking: Reported on 01/13/2021), Disp: 7 tablet, Rfl: 0 .  diltiazem (TIAZAC) 180 MG 24 hr capsule, Take by mouth. (Patient not taking: Reported on 01/13/2021), Disp: , Rfl:  .  estradiol (ESTRACE) 0.5 MG tablet, Take 1 tablet (0.5 mg total) by mouth daily. (Patient not taking: No sig reported), Disp: 30 tablet, Rfl: 12 .  ferrous sulfate 325 (65 FE) MG EC tablet, Take by mouth. (Patient not taking: No sig reported), Disp: , Rfl:  .  gabapentin (NEURONTIN) 100 MG capsule, Take 1 capsule (100 mg total) by mouth at bedtime. (Patient not taking: No sig reported), Disp: 30 capsule, Rfl: 2 .  ipratropium (ATROVENT) 0.06 % nasal spray, , Disp: , Rfl:  .  lidocaine-prilocaine (EMLA) cream, Apply to affected area once (Patient not taking: No sig reported), Disp: 30 g, Rfl: 3 .  loperamide (IMODIUM A-D) 2 MG tablet, Take 2 at onset of diarrhea, then 1 every 2hrs until  12hr without a BM. May take 2 tab every 4hrs at bedtime. If diarrhea recurs repeat. (Patient not taking: No sig reported), Disp: 100 tablet, Rfl: 1 .  LORazepam (ATIVAN) 0.5 MG tablet, Take 1 tablet (0.5 mg total) by mouth every 8 (eight) hours. (Patient not taking: No sig reported), Disp: 60 tablet, Rfl: 0 .  losartan (COZAAR) 100 MG tablet, Take 100 mg by mouth daily. (Patient not taking: No sig reported), Disp: , Rfl:  .  metFORMIN (GLUCOPHAGE-XR) 500 MG 24 hr tablet, take 1 tablet by mouth once daily with DINNER (Patient not taking: No sig reported), Disp: , Rfl: 0 .  Omega-3 Fatty Acids (FISH OIL PO), Take by mouth. (Patient not taking: No sig reported), Disp: , Rfl:  .  ondansetron (ZOFRAN) 8 MG tablet, Take 1 tablet (8 mg total) by mouth 2 (two) times daily as needed. Start on day 3 after chemotherapy. (Patient not taking: No sig reported), Disp: 30 tablet, Rfl: 1 .  ONE TOUCH ULTRA TEST test strip, , Disp: , Rfl: 1 .  ONETOUCH DELICA LANCETS 09F MISC, 1 each by XX route as directed. Check CBG's fasting once daily. Dx: E11.9 (Patient not taking: No sig reported), Disp: , Rfl:  .  prochlorperazine (COMPAZINE) 10 MG tablet, Take 1 tablet (10 mg total) by mouth every 6 (six) hours as needed (Nausea or vomiting). (Patient not taking: No sig reported), Disp: 30 tablet, Rfl: 1 .  sertraline (ZOLOFT) 50 MG tablet, Take 1 tablet (50 mg total) by mouth daily. (Patient not taking: No sig reported), Disp: 30 tablet, Rfl: 2 No current facility-administered medications for this visit.  Facility-Administered Medications Ordered in Other Visits:  .  0.9 %  sodium chloride infusion, , Intravenous, Continuous, Sindy Guadeloupe, MD, Stopped at 01/13/21 1059  Physical exam:  Vitals:   01/13/21 0915  BP: (!) 75/50  Pulse: (!) 43  Resp: 16  Temp: (!) 96.4 F (35.8 C)  TempSrc: Tympanic  SpO2: 98%  Weight: 136 lb 12.8 oz (62.1 kg)   Physical Exam Constitutional:      Comments: Appears significantly  fatigued and sitting in a wheelchair  Cardiovascular:     Rate and Rhythm: Regular rhythm. Tachycardia present.     Heart sounds: Normal heart sounds.  Pulmonary:     Effort: Pulmonary effort is normal.     Breath sounds: Normal breath sounds.  Abdominal:     General: Bowel sounds are normal.     Palpations: Abdomen is  soft.  Skin:    General: Skin is warm and dry.  Neurological:     Mental Status: She is alert and oriented to person, place, and time.      CMP Latest Ref Rng & Units 01/13/2021  Glucose 70 - 99 mg/dL 342(H)  BUN 8 - 23 mg/dL 31(H)  Creatinine 0.44 - 1.00 mg/dL 1.32(H)  Sodium 135 - 145 mmol/L 135  Potassium 3.5 - 5.1 mmol/L 3.7  Chloride 98 - 111 mmol/L 98  CO2 22 - 32 mmol/L 26  Calcium 8.9 - 10.3 mg/dL 8.9  Total Protein 6.5 - 8.1 g/dL 6.2(L)  Total Bilirubin 0.3 - 1.2 mg/dL 0.5  Alkaline Phos 38 - 126 U/L 55  AST 15 - 41 U/L 12(L)  ALT 0 - 44 U/L 14   CBC Latest Ref Rng & Units 01/13/2021  WBC 4.0 - 10.5 K/uL 3.2(L)  Hemoglobin 12.0 - 15.0 g/dL 11.3(L)  Hematocrit 36.0 - 46.0 % 35.7(L)  Platelets 150 - 400 K/uL 215    No images are attached to the encounter.  PERIPHERAL VASCULAR CATHETERIZATION  Result Date: 12/27/2020 See op note    Assessment and plan- Patient is a 80 y.o. 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  She is here for on treatment assessment prior to cycle 2 of modified FOLFIRINOX chemotherapy.  Patient has tolerated chemotherapy poorly mainly with increased fatigue to the point that she is not able to carry on her ADLs.  Her labs remain fairly unremarkable at this time.  I will hold off on any further chemotherapy at least 2 weeks.  She will receive IV fluids today and see NP Vonna Kotyk [employer]orders next week for possible IV fluids.  I will assess her performance status at 2 weeks and decide if she would be strong enough to restart chemotherapy.  At that time I will hold off on giving her any further  modified FOLFIRINOX chemotherapy and switch her to single agent gemcitabine to start with 2 weeks on 1 week off and based on her tolerance consider adding Abraxane in the future.  Back pain and abdominal pain.  Patient has not had any pain from her cancer prior to starting chemotherapy.  Etiology of present back pain is currently unclear.  Continue morphine 15 mg twice daily and as needed Norco at this time.  Have also encouraged her to take bowel medications to prevent opioid-induced constipation   Visit Diagnosis 1. Pancreatic cancer metastasized to liver ([place])   2. Chemotherapy-induced fatigue      Dr. Randa Evens, MD, MPH [facility] at [facility] [phone] 01/13/2021 12:59 PM

## 2021-01-14 ENCOUNTER — Telehealth: Payer: Self-pay | Admitting: *Deleted

## 2021-01-14 NOTE — Telephone Encounter (Signed)
Also asking about whether she should get the 4th COVID injection, the 2nd Booster they are now recommending

## 2021-01-14 NOTE — Telephone Encounter (Signed)
Patient called answering service during lunch stating that she received a large package that has a walker in it and wants to know why she got it. Please return her call

## 2021-01-14 NOTE — Telephone Encounter (Signed)
I spoke with patient and husband by phone.  I confirmed that we had ordered her a walker.  Husband had some questions about insurance coverage for durable medical equipment.  Will have Zack Blank call him.  We also discussed COVID booster coverage in light of her being immunocompromised.  All questions answered.

## 2021-01-15 ENCOUNTER — Inpatient Hospital Stay: Payer: Medicare PPO

## 2021-01-17 ENCOUNTER — Telehealth: Payer: Self-pay | Admitting: *Deleted

## 2021-01-17 NOTE — Telephone Encounter (Signed)
I spoke with [name] and gave orders.

## 2021-01-17 NOTE — Telephone Encounter (Signed)
Can you please look into this?

## 2021-01-17 NOTE — Telephone Encounter (Signed)
Another call from [place] asking for a social work referral

## 2021-01-17 NOTE — Telephone Encounter (Signed)
[facility] occupational therapy called asking for approval for orders for occupational therapy 1 wk 5. Please return call to [phone]

## 2021-01-20 ENCOUNTER — Other Ambulatory Visit: Payer: Medicare PPO

## 2021-01-20 ENCOUNTER — Inpatient Hospital Stay: Payer: Medicare PPO

## 2021-01-20 ENCOUNTER — Encounter: Payer: Self-pay | Admitting: Hospice and Palliative Medicine

## 2021-01-20 ENCOUNTER — Inpatient Hospital Stay: Payer: Medicare PPO | Attending: Oncology | Admitting: Hospice and Palliative Medicine

## 2021-01-20 VITALS — BP 136/93 | HR 106 | Temp 97.1°F | Wt 140.3 lb

## 2021-01-20 DIAGNOSIS — D6481 Anemia due to antineoplastic chemotherapy: Secondary | ICD-10-CM | POA: Diagnosis not present

## 2021-01-20 DIAGNOSIS — C259 Malignant neoplasm of pancreas, unspecified: Secondary | ICD-10-CM

## 2021-01-20 DIAGNOSIS — C252 Malignant neoplasm of tail of pancreas: Secondary | ICD-10-CM | POA: Insufficient documentation

## 2021-01-20 DIAGNOSIS — C787 Secondary malignant neoplasm of liver and intrahepatic bile duct: Secondary | ICD-10-CM | POA: Insufficient documentation

## 2021-01-20 DIAGNOSIS — Z5111 Encounter for antineoplastic chemotherapy: Secondary | ICD-10-CM | POA: Diagnosis present

## 2021-01-20 DIAGNOSIS — E860 Dehydration: Secondary | ICD-10-CM

## 2021-01-20 DIAGNOSIS — Z515 Encounter for palliative care: Secondary | ICD-10-CM | POA: Diagnosis not present

## 2021-01-20 MED ORDER — HEPARIN SOD (PORK) LOCK FLUSH 100 UNIT/ML IV SOLN
500.0000 [IU] | Freq: Once | INTRAVENOUS | Status: AC
  Administered 2021-01-20: 500 [IU] via INTRAVENOUS
  Filled 2021-01-20: qty 5

## 2021-01-20 MED ORDER — SODIUM CHLORIDE 0.9% FLUSH
10.0000 mL | INTRAVENOUS | Status: DC | PRN
  Administered 2021-01-20: 10 mL via INTRAVENOUS
  Filled 2021-01-20: qty 10

## 2021-01-20 MED ORDER — TRAZODONE HCL 50 MG PO TABS: 25.0000 mg | ORAL_TABLET | Freq: Every evening | ORAL | 0 refills | Status: DC | PRN

## 2021-01-20 MED ORDER — SODIUM CHLORIDE 0.9 % IV SOLN
Freq: Once | INTRAVENOUS | Status: AC
Start: 2021-01-20 — End: 2021-01-20
  Filled 2021-01-20: qty 250

## 2021-01-20 NOTE — Progress Notes (Signed)
Pt feeling a lot better. Received 1 liter of NS for hydration today. Discharged to home. No complaints.

## 2021-01-20 NOTE — Progress Notes (Signed)
[place]  [phone] [phone]   Name: Rhonda Day Date: 01/20/2021 MRN: [id number]  DOB: [birth year]  Patient Care Team: Dion Body, MD as PCP - General (Family Medicine) Clent Jacks, RN as Oncology Nurse Navigator    REASON FOR CONSULTATION: Rhonda Day is a 80 y.o. 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  Patient was found to have a pancreatic tail mass in December 2021.  She ultimately underwent EUS and biopsy with pathology consistent for adenocarcinoma.  Patient is referred to palliative care to help address goals and manage ongoing symptoms.   SOCIAL HISTORY:     reports that she quit smoking about 41 years ago. She has never used smokeless tobacco. She reports that she does not drink alcohol and does not use drugs.  Patient is married and lives at home with her husband.  She has no children.  ADVANCE DIRECTIVES:  None on file  CODE STATUS:   PAST MEDICAL HISTORY: Past Medical History:  Diagnosis Date  . Bladder infection   . Diabetes mellitus without complication ([place])   . Hypertension   . Pancreatic cancer ([place])    with liver mass    PAST SURGICAL HISTORY:  Past Surgical History:  Procedure Laterality Date  . BACK SURGERY    . PORTA CATH INSERTION N/A 12/27/2020   Procedure: PORTA CATH INSERTION;  Surgeon: Algernon Huxley, MD;  Location: [place] [facility];  Service: Cardiovascular;  Laterality: N/A;  . TUBAL LIGATION      HEMATOLOGY/ONCOLOGY HISTORY:  Oncology History  Pancreatic cancer metastasized to liver ([place])  12/13/2020 Initial Diagnosis   Pancreatic cancer metastasized to liver ([place])   12/13/2020 Cancer Staging   Staging form: Exocrine Pancreas, AJCC 8th Edition - Clinical stage from 12/13/2020: Stage IV (cT2, cN0, cM1) - Signed by Sindy Guadeloupe, MD on  12/13/2020 Total positive nodes: 0   12/30/2020 -  Chemotherapy    Patient is on Treatment Plan: PANCREAS MODIFIED FOLFIRINOX Q14D X 4 CYCLES        ALLERGIES:  is allergic to sulfa antibiotics.  MEDICATIONS:  Current Outpatient Medications  Medication Sig Dispense Refill  . HYDROcodone-acetaminophen (NORCO) 5-325 MG tablet Take 1 tablet by mouth every 6 (six) hours as needed for moderate pain. 45 tablet 0  . morphine (MS CONTIN) 15 MG 12 hr tablet Take 1 tablet (15 mg total) by mouth every 12 (twelve) hours. 60 tablet 0  . ascorbic acid (VITAMIN C) 1000 MG tablet Take by mouth. (Patient not taking: No sig reported)    . azelastine (ASTELIN) 0.1 % nasal spray azelastine 137 mcg (0.1 %) nasal spray aerosol (Patient not taking: No sig reported)    . azelastine (OPTIVAR) 0.05 % ophthalmic solution  (Patient not taking: No sig reported)  3  . b complex vitamins capsule Take 1 capsule by mouth daily. (Patient not taking: No sig reported)    . beta carotene 10000 UNIT capsule Take by mouth. (Patient not taking: No sig reported)    . dexamethasone (DECADRON) 4 MG tablet Take 2 tablets (8 mg total) by mouth daily. Start the day after chemotherapy for 3 days. Take with food. (Patient not taking: No sig reported) 8 tablet 5  . dexamethasone (DECADRON) 4 MG tablet Take 1 tablet (4 mg total) by mouth daily. (Patient not taking: No sig reported) 7 tablet 0  . diltiazem (  TIAZAC) 180 MG 24 hr capsule Take by mouth. (Patient not taking: No sig reported)    . estradiol (ESTRACE) 0.5 MG tablet Take 1 tablet (0.5 mg total) by mouth daily. (Patient not taking: No sig reported) 30 tablet 12  . ferrous sulfate 325 (65 FE) MG EC tablet Take by mouth. (Patient not taking: No sig reported)    . gabapentin (NEURONTIN) 100 MG capsule Take 1 capsule (100 mg total) by mouth at bedtime. (Patient not taking: No sig reported) 30 capsule 2  . ipratropium (ATROVENT) 0.06 % nasal spray  (Patient not taking: No sig reported)     . lidocaine-prilocaine (EMLA) cream Apply to affected area once (Patient not taking: No sig reported) 30 g 3  . loperamide (IMODIUM A-D) 2 MG tablet Take 2 at onset of diarrhea, then 1 every 2hrs until 12hr without a BM. May take 2 tab every 4hrs at bedtime. If diarrhea recurs repeat. (Patient not taking: No sig reported) 100 tablet 1  . LORazepam (ATIVAN) 0.5 MG tablet Take 1 tablet (0.5 mg total) by mouth every 8 (eight) hours. (Patient not taking: No sig reported) 60 tablet 0  . losartan (COZAAR) 100 MG tablet Take 100 mg by mouth daily. (Patient not taking: No sig reported)    . metFORMIN (GLUCOPHAGE-XR) 500 MG 24 hr tablet take 1 tablet by mouth once daily with DINNER (Patient not taking: No sig reported)  0  . Omega-3 Fatty Acids (FISH OIL PO) Take by mouth. (Patient not taking: No sig reported)    . ondansetron (ZOFRAN) 8 MG tablet Take 1 tablet (8 mg total) by mouth 2 (two) times daily as needed. Start on day 3 after chemotherapy. (Patient not taking: No sig reported) 30 tablet 1  . ONE TOUCH ULTRA TEST test strip  (Patient not taking: No sig reported)  1  . ONETOUCH DELICA LANCETS 92Z MISC 1 each by XX route as directed. Check CBG's fasting once daily. Dx: E11.9 (Patient not taking: No sig reported)    . prochlorperazine (COMPAZINE) 10 MG tablet Take 1 tablet (10 mg total) by mouth every 6 (six) hours as needed (Nausea or vomiting). (Patient not taking: No sig reported) 30 tablet 1  . sertraline (ZOLOFT) 50 MG tablet Take 1 tablet (50 mg total) by mouth daily. (Patient not taking: No sig reported) 30 tablet 2   No current facility-administered medications for this visit.   Facility-Administered Medications Ordered in Other Visits  Medication Dose Route Frequency Provider Last Rate Last Admin  . 0.9 %  sodium chloride infusion   Intravenous Once Simeon Vera, Kirt Boys, NP 999 mL/hr at 01/20/21 0923 New Bag at 01/20/21 0923  . heparin lock flush 100 unit/mL  500 Units Intravenous Once Quantel Mcinturff,  Vonna Kotyk R, NP      . sodium chloride flush (NS) 0.9 % injection 10 mL  10 mL Intravenous PRN Janea Schwenn, Kirt Boys, NP        VITAL SIGNS: BP (!) 136/93   Pulse (!) 106   Temp (!) 97.1 F (36.2 C) (Tympanic)   Wt 140 lb 4.8 oz (63.6 kg)   LMP  (LMP Unknown)   BMI 20.72 kg/m  Filed Weights   01/20/21 0834  Weight: 140 lb 4.8 oz (63.6 kg)    Estimated body mass index is 20.72 kg/m as calculated from the following:   Height as of 12/27/20: 5\' 9"  (1.753 m).   Weight as of this encounter: 140 lb 4.8 oz (63.6 kg).  LABS: CBC:  Component Value Date/Time   WBC 3.2 (L) 01/13/2021 0857   HGB 11.3 (L) 01/13/2021 0857   HCT 35.7 (L) 01/13/2021 0857   PLT 215 01/13/2021 0857   MCV 85.6 01/13/2021 0857   NEUTROABS 0.8 (L) 01/13/2021 0857   LYMPHSABS 1.5 01/13/2021 0857   MONOABS 0.7 01/13/2021 0857   EOSABS 0.1 01/13/2021 0857   BASOSABS 0.0 01/13/2021 0857   Comprehensive Metabolic Panel:    Component Value Date/Time   NA 135 01/13/2021 0857   K 3.7 01/13/2021 0857   CL 98 01/13/2021 0857   CO2 26 01/13/2021 0857   BUN 31 (H) 01/13/2021 0857   CREATININE 1.32 (H) 01/13/2021 0857   GLUCOSE 342 (H) 01/13/2021 0857   CALCIUM 8.9 01/13/2021 0857   AST 12 (L) 01/13/2021 0857   ALT 14 01/13/2021 0857   ALKPHOS 55 01/13/2021 0857   BILITOT 0.5 01/13/2021 0857   PROT 6.2 (L) 01/13/2021 0857   ALBUMIN 3.2 (L) 01/13/2021 0857    RADIOGRAPHIC STUDIES: PERIPHERAL VASCULAR CATHETERIZATION  Result Date: 12/27/2020 See op note   PERFORMANCE STATUS (ECOG) : 2 - Symptomatic, <50% confined to bed  Review of Systems Unless otherwise noted, a complete review of systems is negative.  Physical Exam General: NAD Pulmonary: Unlabored Extremities: no edema, no joint deformities Skin: no rashes Neurological: Weakness but otherwise nonfocal  IMPRESSION: Follow-up visit.    Patient reports that she is feeling much better today.  She denies any severe symptomatic complaints today.  She  continues to endorse pain in bilateral hips but finds that it is relatively stable on regimen of MS Contin and Norco for breakthrough pain.  There are days when she takes Norco every 4-6 hours and days where she requires it less frequently.  Her constipation is mostly resolved.  She reports her appetite has dramatically improved.  She now has someone bringing her home cooked meals.  She says that her fatigue is better and she has more energy overall.  However, she still has issues both initiating and maintaining sleep.  She says last night she awoke every hour or so and had difficulty returning to sleep.  Patient had questions about her medications.  She asked that we clean up her MAR.  She is no longer taking any of her multivitamins.  She self discontinued Zoloft and is not interested in restarting it at the present time.  She also has been holding her Metformin and antihypertensives.  Blood pressure has been mildly elevated in the past.  Probably safe for her to resume antihypertensives but patient plans to speak with her cardiologist.  She was also hyperglycemic at time of last lab draw.  She would likely benefit from resuming Metformin.  Discussed recommendations with patient.  PLAN: -Continue current scope of treatment -Start trazodone 25 to 50 mg nightly as needed for sleep -Continue Norco/MS Contin -Daily bowel regimen -MOST form reviewed -RTC next week to see Dr. Janese Banks  Case and plan discussed with Dr. Janese Banks  Patient expressed understanding and was in agreement with this plan. She also understands that She can call the clinic at any time with any questions, concerns, or complaints.     Time Total: 25 minutes  Visit consisted of counseling and education dealing with the complex and emotionally intense issues of symptom management and palliative care in the setting of serious and potentially life-threatening illness.Greater than 50%  of this time was spent counseling and coordinating care  related to the above assessment and plan.  Signed  by: Altha Harm, PhD, NP-C

## 2021-01-23 ENCOUNTER — Other Ambulatory Visit: Payer: Self-pay | Admitting: *Deleted

## 2021-01-23 MED ORDER — HYDROCODONE-ACETAMINOPHEN 5-325 MG PO TABS: 1.0000 | ORAL_TABLET | Freq: Four times a day (QID) | ORAL | 0 refills | Status: DC | PRN

## 2021-01-27 ENCOUNTER — Inpatient Hospital Stay: Payer: Medicare PPO

## 2021-01-27 ENCOUNTER — Telehealth: Payer: Self-pay | Admitting: *Deleted

## 2021-01-27 ENCOUNTER — Encounter: Payer: Self-pay | Admitting: Oncology

## 2021-01-27 ENCOUNTER — Inpatient Hospital Stay (HOSPITAL_BASED_OUTPATIENT_CLINIC_OR_DEPARTMENT_OTHER): Payer: Medicare PPO | Admitting: Oncology

## 2021-01-27 VITALS — BP 133/62 | HR 86 | Temp 97.3°F | Resp 16 | Wt 146.3 lb

## 2021-01-27 DIAGNOSIS — C787 Secondary malignant neoplasm of liver and intrahepatic bile duct: Secondary | ICD-10-CM | POA: Diagnosis not present

## 2021-01-27 DIAGNOSIS — C259 Malignant neoplasm of pancreas, unspecified: Secondary | ICD-10-CM | POA: Diagnosis not present

## 2021-01-27 DIAGNOSIS — Z5111 Encounter for antineoplastic chemotherapy: Secondary | ICD-10-CM | POA: Diagnosis not present

## 2021-01-27 DIAGNOSIS — E860 Dehydration: Secondary | ICD-10-CM

## 2021-01-27 LAB — CBC WITH DIFFERENTIAL/PLATELET
Abs Immature Granulocytes: 0.08 10*3/uL — ABNORMAL HIGH (ref 0.00–0.07)
Basophils Absolute: 0 10*3/uL (ref 0.0–0.1)
Basophils Relative: 0 %
Eosinophils Absolute: 0.1 10*3/uL (ref 0.0–0.5)
Eosinophils Relative: 2 %
HCT: 31.6 % — ABNORMAL LOW (ref 36.0–46.0)
Hemoglobin: 10 g/dL — ABNORMAL LOW (ref 12.0–15.0)
Immature Granulocytes: 1 %
Lymphocytes Relative: 24 %
Lymphs Abs: 2.2 10*3/uL (ref 0.7–4.0)
MCH: 27.5 pg (ref 26.0–34.0)
MCHC: 31.6 g/dL (ref 30.0–36.0)
MCV: 86.8 fL (ref 80.0–100.0)
Monocytes Absolute: 0.7 10*3/uL (ref 0.1–1.0)
Monocytes Relative: 8 %
Neutro Abs: 6.2 10*3/uL (ref 1.7–7.7)
Neutrophils Relative %: 65 %
Platelets: 274 10*3/uL (ref 150–400)
RBC: 3.64 MIL/uL — ABNORMAL LOW (ref 3.87–5.11)
RDW: 14.6 % (ref 11.5–15.5)
WBC: 9.4 10*3/uL (ref 4.0–10.5)
nRBC: 0 % (ref 0.0–0.2)

## 2021-01-27 LAB — COMPREHENSIVE METABOLIC PANEL
ALT: 27 U/L (ref 0–44)
AST: 26 U/L (ref 15–41)
Albumin: 3.6 g/dL (ref 3.5–5.0)
Alkaline Phosphatase: 92 U/L (ref 38–126)
Anion gap: 11 (ref 5–15)
BUN: 29 mg/dL — ABNORMAL HIGH (ref 8–23)
CO2: 24 mmol/L (ref 22–32)
Calcium: 9.3 mg/dL (ref 8.9–10.3)
Chloride: 98 mmol/L (ref 98–111)
Creatinine, Ser: 1.24 mg/dL — ABNORMAL HIGH (ref 0.44–1.00)
GFR, Estimated: 45 mL/min — ABNORMAL LOW (ref >60)
Glucose, Bld: 390 mg/dL — ABNORMAL HIGH (ref 70–99)
Potassium: 4.4 mmol/L (ref 3.5–5.1)
Sodium: 133 mmol/L — ABNORMAL LOW (ref 135–145)
Total Bilirubin: 0.4 mg/dL (ref 0.3–1.2)
Total Protein: 6.7 g/dL (ref 6.5–8.1)

## 2021-01-27 MED ORDER — SODIUM CHLORIDE 0.9% FLUSH
10.0000 mL | INTRAVENOUS | Status: DC | PRN
  Administered 2021-01-27: 10 mL via INTRAVENOUS
  Filled 2021-01-27: qty 10

## 2021-01-27 MED ORDER — HEPARIN SOD (PORK) LOCK FLUSH 100 UNIT/ML IV SOLN
500.0000 [IU] | Freq: Once | INTRAVENOUS | Status: AC
  Administered 2021-01-27: 500 [IU] via INTRAVENOUS
  Filled 2021-01-27: qty 5

## 2021-01-27 MED ORDER — SODIUM CHLORIDE 0.9 % IV SOLN
Freq: Once | INTRAVENOUS | Status: AC
  Filled 2021-01-27: qty 250

## 2021-01-27 NOTE — Telephone Encounter (Signed)
Sent in basket about pt's sugars and she was struggling after her first chemo and had lost of side effects and just now getting back to her self. During this time of struggle she stopped taking several of her meds including there diabetic med and just started it back last friday

## 2021-01-27 NOTE — Progress Notes (Signed)
Hematology/Oncology Consult note [facility]  [phone] [phone]  Patient Care Team: Dion Body, MD as PCP - General (Family Medicine) Clent Jacks, RN as Oncology Nurse Navigator   Name of the patient: Rhonda Day  [id number]  Aug 27, 1941   Date of visit: 01/27/21  Diagnosis- metastatic pancreatic cancer with liver metastases   Chief complaint/ Reason for visit-routine follow-up of pancreatic cancer to decide when to restart chemotherapy  Heme/Onc history: Patient is a 80 year old female who underwent ultrasound abdomen in December 2021 which showed hypoechoic mass in the pancreatic tail measuring 2.1 x 1.8 x 4.5 cm which was not seen on prior CT scans on MRI exams a year ago. This was followed by a CT abdomen and pelvis with contrast which showed ill-defined hypervascular lesion in segment 8 of liver measuring 1.6 x 1.4 cm concerning for metastases. 2 other lesions 1.7 x 1 cm and 1.5 x 0.8 cm were also noted. Large mass in the body of pancreas measuring 5.6 x 2.2 x 2.9 cm. The lesion causes obstruction of the pancreatic duct resulting in ductal dilatation throughout the tail of the pancreas. 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 Splenic vein appears chronically occluded. Lesion also comes in contact with splenic artery which remains patent. This was followed by MRI abdomen which again showed similar findings. Multiple foci of restricted diffusion with hyperenhancement noted in the right hepatic lobe. Prominent portocaval lymph node seen for local nodal metastases along with a 5.3 cm body and tail pancreatic mass  Case was discussed at tumor board and lab was liver biopsy 2 weeks after patient initial Covid test that was positive. However at the day of the biopsy this lesion was in close association with other  vascular structures and given the potential higher risks liver biopsy was aborted and EUS was recommended. She is currently on Cardizem.  Patient had a EUS at [facility] Dr. Mont Dutton. Pathology was consistent with adenocarcinoma. Patient had a PET scan on 12/11/2020 which showed a hypermetabolic lesion in the medial aspect of the liver with an SUV of 4.2 there is a subtle hypodensity on the comparison CT measuring 9 mm at this level. The hypermetabolic lesion in the dome of the liver corresponds to the enhancing lesion on the comparison MRI. 3.4 x 2.7 cm lesion in the mid body of pancreas with an SUV of 5.1. No hypermetabolic peripancreatic lymph nodes.  Patient received 1 cycle of modified FOLFIRINOX chemotherapy on 12/30/2020.  Following that patient had significant nausea vomiting fatigue and declining performance status requiring chemotherapy to be put on hold.  Plan is to restart treatment but switch her to single agent gemcitabine chemotherapy  Interval history-patient reports doing much better over the last 3 to 4 weeks and her energy levels have improved greatly but not completely back to baseline.  She feels like she is 80% there.  She is independent of her ADLs but does feel the need to rest at frequent intervals.  She had also stopped taking her Metformin for diabetes and only restarted it 2 to 3 days ago.  ECOG PS- 1 Pain scale- 0   Review of systems- Review of Systems  Constitutional: Positive for malaise/fatigue. Negative for chills, fever and weight loss.  HENT: Negative for congestion, ear discharge and nosebleeds.   Eyes: Negative for blurred vision.  Respiratory: Negative for cough, hemoptysis, sputum production,  shortness of breath and wheezing.   Cardiovascular: Negative for chest pain, palpitations, orthopnea and claudication.  Gastrointestinal: Negative for abdominal pain, blood in stool, constipation, diarrhea, heartburn, melena, nausea and vomiting.  Genitourinary:  Negative for dysuria, flank pain, frequency, hematuria and urgency.  Musculoskeletal: Negative for back pain, joint pain and myalgias.  Skin: Negative for rash.  Neurological: Negative for dizziness, tingling, focal weakness, seizures, weakness and headaches.  Endo/Heme/Allergies: Does not bruise/bleed easily.  Psychiatric/Behavioral: Negative for depression and suicidal ideas. The patient does not have insomnia.        Allergies  Allergen Reactions  . Sulfa Antibiotics Rash    Other reaction(s): RASH Other reaction(s): RASH      Past Medical History:  Diagnosis Date  . Bladder infection   . Diabetes mellitus without complication ([place])   . Hypertension   . Pancreatic cancer [facility])    with liver mass     Past Surgical History:  Procedure Laterality Date  . BACK SURGERY    . PORTA CATH INSERTION N/A 12/27/2020   Procedure: PORTA CATH INSERTION;  Surgeon: Algernon Huxley, MD;  Location: [place] [facility];  Service: Cardiovascular;  Laterality: N/A;  . TUBAL LIGATION      Social History   Socioeconomic History  . Marital status: Married    Spouse name: Not on file  . Number of children: Not on file  . Years of education: Not on file  . Highest education level: Not on file  Occupational History  . Not on file  Tobacco Use  . Smoking status: Former Smoker    Quit date: 11/20/1979    Years since quitting: 41.2  . Smokeless tobacco: Never Used  Vaping Use  . Vaping Use: Never used  Substance and Sexual Activity  . Alcohol use: No    Alcohol/week: 0.0 standard drinks  . Drug use: No  . Sexual activity: Never  Other Topics Concern  . Not on file  Social History Narrative  . Not on file   Social Determinants of Health   Financial Resource Strain: Not on file  Food Insecurity: Not on file  Transportation Needs: Not on file  Physical Activity: Not on file  Stress: Not on file  Social Connections: Not on file  Intimate Partner Violence: Not on file    Family  History  Problem Relation Age of Onset  . Kidney cancer Father   . Breast cancer Neg Hx      Current Outpatient Medications:  .  azelastine (ASTELIN) 0.1 % nasal spray, Place into both nostrils daily as needed., Disp: , Rfl:  .  azelastine (OPTIVAR) 0.05 % ophthalmic solution, Place into both eyes daily as needed., Disp: , Rfl: 3 .  diltiazem (TIAZAC) 180 MG 24 hr capsule, Take by mouth., Disp: , Rfl:  .  gabapentin (NEURONTIN) 100 MG capsule, Take 1 capsule (100 mg total) by mouth at bedtime., Disp: 30 capsule, Rfl: 2 .  HYDROcodone-acetaminophen (NORCO) 5-325 MG tablet, Take 1 tablet by mouth every 6 (six) hours as needed for moderate pain., Disp: 45 tablet, Rfl: 0 .  losartan (COZAAR) 100 MG tablet, Take 100 mg by mouth daily., Disp: , Rfl:  .  metFORMIN (GLUCOPHAGE-XR) 500 MG 24 hr tablet, take 1 tablet by mouth once daily with DINNER, Disp: , Rfl: 0 .  morphine (MS CONTIN) 15 MG 12 hr tablet, Take 1 tablet (15 mg total) by mouth every 12 (twelve) hours., Disp: 60 tablet, Rfl: 0 .  ONE TOUCH  ULTRA TEST test strip, , Disp: , Rfl: 1 .  ONETOUCH DELICA LANCETS 20U MISC, 1 each by XX route as directed. Check CBG's fasting once daily. Dx: E11.9, Disp: , Rfl:  .  traZODone (DESYREL) 50 MG tablet, Take 0.5-1 tablets (25-50 mg total) by mouth at bedtime as needed for sleep., Disp: 30 tablet, Rfl: 0 .  estradiol (ESTRACE) 0.5 MG tablet, Take 1 tablet (0.5 mg total) by mouth daily. (Patient not taking: No sig reported), Disp: 30 tablet, Rfl: 12 .  ferrous sulfate 325 (65 FE) MG EC tablet, Take by mouth. (Patient not taking: No sig reported), Disp: , Rfl:  .  ipratropium (ATROVENT) 0.06 % nasal spray, , Disp: , Rfl:  .  LORazepam (ATIVAN) 0.5 MG tablet, Take 1 tablet (0.5 mg total) by mouth every 8 (eight) hours. (Patient not taking: No sig reported), Disp: 60 tablet, Rfl: 0 No current facility-administered medications for this visit.  Facility-Administered Medications Ordered in Other Visits:  .   sodium chloride flush (NS) 0.9 % injection 10 mL, 10 mL, Intravenous, PRN, Sindy Guadeloupe, MD, 10 mL at 01/27/21 0945  Physical exam:  Vitals:   01/27/21 1037  BP: 133/62  Pulse: 86  Resp: 16  Temp: (!) 97.3 F (36.3 C)  Weight: 146 lb 4.8 oz (66.4 kg)   Physical Exam Constitutional:      General: She is not in acute distress. Cardiovascular:     Rate and Rhythm: Normal rate and regular rhythm.     Heart sounds: Normal heart sounds.  Pulmonary:     Effort: Pulmonary effort is normal.     Breath sounds: Normal breath sounds.  Skin:    General: Skin is warm and dry.  Neurological:     Mental Status: She is alert and oriented to person, place, and time.      CMP Latest Ref Rng & Units 01/27/2021  Glucose 70 - 99 mg/dL 390(H)  BUN 8 - 23 mg/dL 29(H)  Creatinine 0.44 - 1.00 mg/dL 1.24(H)  Sodium 135 - 145 mmol/L 133(L)  Potassium 3.5 - 5.1 mmol/L 4.4  Chloride 98 - 111 mmol/L 98  CO2 22 - 32 mmol/L 24  Calcium 8.9 - 10.3 mg/dL 9.3  Total Protein 6.5 - 8.1 g/dL 6.7  Total Bilirubin 0.3 - 1.2 mg/dL 0.4  Alkaline Phos 38 - 126 U/L 92  AST 15 - 41 U/L 26  ALT 0 - 44 U/L 27   CBC Latest Ref Rng & Units 01/27/2021  WBC 4.0 - 10.5 K/uL 9.4  Hemoglobin 12.0 - 15.0 g/dL 10.0(L)  Hematocrit 36.0 - 46.0 % 31.6(L)  Platelets 150 - 400 K/uL 274      Assessment and plan- Patient is a 80 y.o. female s/p 1 cycle of modified FOLFIRINOX chemotherapy with poor tolerance here to discuss ongoing care for stage IV pancreatic cancer  Patient has not received any chemotherapy for 4 weeks now.  She does feel significantly better at this time and her energy levels have improved.  Given her poor tolerance to modified FOLFIRINOX chemotherapy I will plan to hold off on that regimen and switch her to single agent gemcitabine at 1000 mg per metered squared 2 weeks on and 1 week off.  If she tolerates 2 cycles of gemcitabine well without any significant side effects I will consider adding Abraxane to  that regimen subsequently.  I will consider FOLFOX down the line if she has progression on gemcitabine chemotherapy based on her performance status.  She will receive 1 L of fluids today.  I will see her back in 1 week to restart chemotherapy.  Discussed risks and benefits of chemotherapy including all but not limited to nausea, vomiting, low blood counts, risk of infections and hospitalizations.  Treatment will be given with palliative intent.   Visit Diagnosis 1. Pancreatic cancer metastasized to liver [facility])      Dr. Randa Evens, MD, MPH [facility] [phone] 01/27/2021 1:01 PM

## 2021-01-27 NOTE — Progress Notes (Signed)
Pt states she is feling better, still easily fatigued. Eating and drinking good. Wants to know  When she will get back to driving. And she is getting PT once a week

## 2021-01-28 ENCOUNTER — Encounter: Payer: Self-pay | Admitting: Licensed Clinical Social Worker

## 2021-01-28 ENCOUNTER — Inpatient Hospital Stay: Payer: Medicare PPO

## 2021-01-28 ENCOUNTER — Inpatient Hospital Stay (HOSPITAL_BASED_OUTPATIENT_CLINIC_OR_DEPARTMENT_OTHER): Payer: Medicare PPO | Admitting: Licensed Clinical Social Worker

## 2021-01-28 DIAGNOSIS — Z800 Family history of malignant neoplasm of digestive organs: Secondary | ICD-10-CM | POA: Insufficient documentation

## 2021-01-28 DIAGNOSIS — C787 Secondary malignant neoplasm of liver and intrahepatic bile duct: Secondary | ICD-10-CM | POA: Diagnosis not present

## 2021-01-28 DIAGNOSIS — Z1379 Encounter for other screening for genetic and chromosomal anomalies: Secondary | ICD-10-CM

## 2021-01-28 DIAGNOSIS — C259 Malignant neoplasm of pancreas, unspecified: Secondary | ICD-10-CM | POA: Diagnosis not present

## 2021-01-28 DIAGNOSIS — Z801 Family history of malignant neoplasm of trachea, bronchus and lung: Secondary | ICD-10-CM

## 2021-01-28 DIAGNOSIS — Z8051 Family history of malignant neoplasm of kidney: Secondary | ICD-10-CM

## 2021-01-28 HISTORY — DX: Encounter for other screening for genetic and chromosomal anomalies: Z13.79

## 2021-01-28 NOTE — Progress Notes (Signed)
Nutrition Assessment   Reason for Assessment:  Referral for pancreatic mass   ASSESSMENT:  80 year old female with metastatic pancreatic cancer with metastatic disease to liver.  Past medical history of DM, HTN.  Patient could not tolerate modified folfirinox and switched to gemcitabine.    Met with patient following genetics meeting.  Patient reports that she has a good appetite and always has.  Breakfast is usually cereal and banana and toast with jam.  Lunch is a fuller meal of meatloaf with mashed potatoes or lemon chicken and side item.  Has family, friends and paid help to prepare meals for her.  Does not like to eat to much in the evening so may have leftovers from lunch or sandwich or toast with peanut butter.  Drinks clear juice shakes.  Recently purchased some from [place].  Does drink water, juice and milk.  Enjoys desserts at times but tries to limit with blood glucose.    Has resumed taking metformin.  Previously stopped.   Denies nutrition impact symptoms at this time.    Medications: metformin   Labs: glucose 390, BUN 29, creatinine 1.24   Anthropometrics:   Height: 69 inches Weight: 146 lb 4.8 oz on 4/11 UBW: 150s (11/26/20) BMI: 22  136 lb 01/13/21  3% weight loss in the last month and half, concerning   Estimated Energy Needs  Kcals: 1650-1980 Protein: 83-99 g Fluid: 1.6 L   NUTRITION DIAGNOSIS: Inadequate oral intake related to cancer related treatment side effects as evidenced by weight 136 lb but has been improving   INTERVENTION:  Discussed importance of good nutrition to maintain weight and muscle mass. Discussed importance of eating foods high in protein and calories. Encouraged patient to reduced juice and concentrated sweets to improve blood glucose.  Really do not want to place to many diet restrictions on patient at this time.  Contact information provided to patient  MONITORING, EVALUATION, GOAL: weight trends, intake   Next Visit: May  3rd (Tuesday) in infusion or phone  Rustin Erhart B. Zenia Resides, [place], [place] Registered Dietitian [phone] (mobile)

## 2021-01-28 NOTE — Progress Notes (Signed)
REFERRING PROVIDER: Sindy Guadeloupe, MD [place],  [place] [address]  PRIMARY PROVIDER:  Dion Body, MD  PRIMARY REASON FOR VISIT:  1. Pancreatic cancer metastasized to liver ([place])   2. Family history of stomach cancer   3. Family history of kidney cancer   4. Family history of lung cancer   5. Family history of throat cancer      HISTORY OF PRESENT ILLNESS:   Ms. Fischler, a 80 y.o. female, was seen for a  cancer genetics consultation at the request of Dr. Janese Banks due to a personal and family history of cancer.  Ms. Ashworth presents to clinic today to discuss the possibility of a hereditary predisposition to cancer, genetic testing, and to further clarify her future cancer risks, as well as potential cancer risks for family members.   In 2022, at the age of 68, Ms. Verno was diagnosed with exocrine pancreatic cancer, stage IV. She is currently being treated with chemotherapy.   CANCER HISTORY:  Oncology History  Pancreatic cancer metastasized to liver [facility])  12/13/2020 Initial Diagnosis   Pancreatic cancer metastasized to liver [facility])   12/13/2020 Cancer Staging   Staging form: Exocrine Pancreas, AJCC 8th Edition - Clinical stage from 12/13/2020: Stage IV (cT2, cN0, cM1) - Signed by Sindy Guadeloupe, MD on 12/13/2020 Total positive nodes: 0   12/30/2020 - 01/01/2021 Chemotherapy         01/28/2021 -  Chemotherapy    Patient is on Treatment Plan: PANCREAS GEMCITABINE D1,8,Q21D X 6 CYCLES         RISK FACTORS:  Menarche was at age 89.  First live birth at age no children.  OCP use for approximately 15 years.  Ovaries intact: yes.  Hysterectomy: no.  Menopausal status: postmenopausal.  HRT use: 0 years. Colonoscopy: yes; normal. Mammogram within the last year: yes. Number of breast biopsies: 1. Any excessive radiation exposure in the past: no  Past Medical History:  Diagnosis Date  . Bladder infection   . Diabetes mellitus without complication  ([place])   . Family history of kidney cancer   . Family history of lung cancer   . Family history of stomach cancer   . Family history of throat cancer   . Hypertension   . Pancreatic cancer [facility])    with liver mass    Past Surgical History:  Procedure Laterality Date  . BACK SURGERY    . PORTA CATH INSERTION N/A 12/27/2020   Procedure: PORTA CATH INSERTION;  Surgeon: Algernon Huxley, MD;  Location: [place] CV LAB;  Service: Cardiovascular;  Laterality: N/A;  . TUBAL LIGATION      Social History   Socioeconomic History  . Marital status: Married    Spouse name: Not on file  . Number of children: Not on file  . Years of education: Not on file  . Highest education level: Not on file  Occupational History  . Not on file  Tobacco Use  . Smoking status: Former Smoker    Quit date: 11/20/1979    Years since quitting: 41.2  . Smokeless tobacco: Never Used  Vaping Use  . Vaping Use: Never used  Substance and Sexual Activity  . Alcohol use: No    Alcohol/week: 0.0 standard drinks  . Drug use: No  . Sexual activity: Never  Other Topics Concern  . Not on file  Social History Narrative  . Not on file   Social Determinants of Health   Financial Resource  Strain: Not on file  Food Insecurity: Not on file  Transportation Needs: Not on file  Physical Activity: Not on file  Stress: Not on file  Social Connections: Not on file     FAMILY HISTORY:  We obtained a detailed, 4-generation family history.  Significant diagnoses are listed below: Family History  Problem Relation Age of Onset  . Kidney cancer Father 45  . COPD Mother   . Stomach cancer Maternal Aunt   . Throat cancer Maternal Uncle   . Liver cancer Paternal Uncle   . Esophageal cancer Paternal Grandfather   . Lung cancer Maternal Aunt   . Lung cancer Maternal Uncle   . Lung cancer Cousin   . Kidney cancer Cousin 58  . Testicular cancer Nephew        dx 25s  . Breast cancer Neg Hx    Ms. Ponzo has 1 sister, 1  niece and 1 nephew. Her nephew had testicular cancer in his 99s and is doing well now in his 54s.   Ms. Loser mother died at 4 and had COPD. Patient had 3 maternal uncles, 2 maternal aunts. An aunt had stomach cancer and died at 46, her other aunt had lung cancer in her 94s. She had a daughter with a blood disorder, possibly cancer. A maternal uncle had throat cancer and died at 65, his son had kidney cancer and died ar 17 from this. Another maternal uncle had lung cancer at 91 and died at 68. All aunts/uncles were tobacco users.  Ms. Preston father had kidney cancer at 19 and died at 52. Patient had 4 paternal uncles, 3 paternal aunts. One uncle had liver cancer in his 49s, a cousin had lung cancer in her 63s. Paternal grandmother had esophageal cancer in her 52s and used tobacco.   Ms. Zumwalt is unaware of previous family history of genetic testing for hereditary cancer risks. Patient's maternal ancestors are of English descent, and paternal ancestors are of Swiss/German descent. There is noi reported Ashkenazi Jewish ancestry. There is no known consanguinity.    GENETIC COUNSELING ASSESSMENT: Ms. Godbee is a 80 y.o. female with a personal history of pancreatic cancer which is somewhat suggestive of a hereditary cancer syndrome and predisposition to cancer. We, therefore, discussed and recommended the following at today's visit.   DISCUSSION: We discussed that approximately 5-10% of pancreatic cancer is hereditary  Most cases of hereditary pancreatic cancer are associated with BRCA1/BRCA2 genes, although there are other genes associated with hereditary cancer as well. We discussed that testing is beneficial for several reasons including knowing if an individual is candidate for certain targeted therapies, knowing about other cancer risks, identifying potential screening and risk-reduction options that may be appropriate, and to understand if other family members could be at risk for cancer and allow them  to undergo genetic testing.   We reviewed the characteristics, features and inheritance patterns of hereditary cancer syndromes. We also discussed genetic testing, including the appropriate family members to test, the process of testing, insurance coverage and turn-around-time for results. We discussed the implications of a negative, positive and/or variant of uncertain significant result. We recommended Ms. Bellerose pursue genetic testing for the [facility] Multi-Cancer +RNA gene panel and had her blood drawn at a previous appointment.  The Multi-Cancer Panel + RNA offered by [facility] includes sequencing and/or deletion duplication testing of the following 84 genes: AIP, ALK, APC, ATM, AXIN2,BAP1,  BARD1, BLM, BMPR1A, BRCA1, BRCA2, BRIP1, CASR, CDC73, CDH1, CDK4, CDKN1B, CDKN1C, CDKN2A (p14ARF),  CDKN2A (p16INK4a), CEBPA, CHEK2, CTNNA1, DICER1, DIS3L2, EGFR (c.2369C>T, p.Thr790Met variant only), EPCAM (Deletion/duplication testing only), FH, FLCN, GATA2, GPC3, GREM1 (Promoter region deletion/duplication testing only), HOXB13 (c.251G>A, p.Gly84Glu), HRAS, KIT, MAX, MEN1, MET, MITF (c.952G>A, p.Glu318Lys variant only), MLH1, MSH2, MSH3, MSH6, MUTYH, NBN, NF1, NF2, NTHL1, PALB2, PDGFRA, PHOX2B, PMS2, POLD1, POLE, POT1, PRKAR1A, PTCH1, PTEN, RAD50, RAD51C, RAD51D, RB1, RECQL4, RET, RUNX1, SDHAF2, SDHA (sequence changes only), SDHB, SDHC, SDHD, SMAD4, SMARCA4, SMARCB1, SMARCE1, STK11, SUFU, TERC, TERT, TMEM127, TP53, TSC1, TSC2, VHL, WRN and WT1.  Based on Ms. Baumgart's personal history of cancer, she meets medical criteria for genetic testing. Despite that she meets criteria, she may still have an out of pocket cost.   GENETIC TEST RESULTS: Genetic testing reported out on 01/27/2021 through the [facility] Multi-Cancer Panel+RNA cancer panel found no pathogenic mutations.   The Multi-Cancer Panel + RNA offered by [facility] includes sequencing and/or deletion duplication testing of the following 84 genes: AIP, ALK, APC, ATM,  AXIN2,BAP1,  BARD1, BLM, BMPR1A, BRCA1, BRCA2, BRIP1, CASR, CDC73, CDH1, CDK4, CDKN1B, CDKN1C, CDKN2A (p14ARF), CDKN2A (p16INK4a), CEBPA, CHEK2, CTNNA1, DICER1, DIS3L2, EGFR (c.2369C>T, p.Thr790Met variant only), EPCAM (Deletion/duplication testing only), FH, FLCN, GATA2, GPC3, GREM1 (Promoter region deletion/duplication testing only), HOXB13 (c.251G>A, p.Gly84Glu), HRAS, KIT, MAX, MEN1, MET, MITF (c.952G>A, p.Glu318Lys variant only), MLH1, MSH2, MSH3, MSH6, MUTYH, NBN, NF1, NF2, NTHL1, PALB2, PDGFRA, PHOX2B, PMS2, POLD1, POLE, POT1, PRKAR1A, PTCH1, PTEN, RAD50, RAD51C, RAD51D, RB1, RECQL4, RET, RUNX1, SDHAF2, SDHA (sequence changes only), SDHB, SDHC, SDHD, SMAD4, SMARCA4, SMARCB1, SMARCE1, STK11, SUFU, TERC, TERT, TMEM127, TP53, TSC1, TSC2, VHL, WRN and WT1.   The test report has been scanned into EPIC and is located under the Molecular Pathology section of the Results Review tab.  A portion of the result report is included below for reference.     We discussed with Ms. Jacquin that because current genetic testing is not perfect, it is possible there may be a gene mutation in one of these genes that current testing cannot detect, but that chance is small.  We also discussed, that there could be another gene that has not yet been discovered, or that we have not yet tested, that is responsible for the cancer diagnoses in the family. It is also possible there is a hereditary cause for the cancer in the family that Ms. Pinder did not inherit and therefore was not identified in her testing.  Therefore, it is important to remain in touch with cancer genetics in the future so that we can continue to offer Ms. Wickliff the most up to date genetic testing.   Genetic testing did identify 2 Variants of uncertain significance (VUS) - one in the RECQL4 gene called c.1978G>A, a second in the [facility] gene called c.983C>T.  At this time, it is unknown if these variants are associated with increased cancer risk or if they are normal  findings, but most variants such as these get reclassified to being inconsequential. They should not be used to make medical management decisions. With time, we suspect the lab will determine the significance of these variants, if any. If we do learn more about them, we will try to contact Ms. Jezewski to discuss it further. However, it is important to stay in touch with [place] periodically and keep the address and phone number up to date.  ADDITIONAL GENETIC TESTING: We discussed with Ms. Roggenkamp that her genetic testing was fairly extensive.  If there are genes identified to increase cancer risk that can be analyzed in the future,  we would be happy to discuss and coordinate this testing at that time.    CANCER SCREENING RECOMMENDATIONS: Ms. Ekblad test result is considered negative (normal).  This means that we have not identified a hereditary cause for her  personal and family history of cancer at this time. Most cancers happen by chance and this negative test suggests that her cancer may fall into this category.    While reassuring, this does not definitively rule out a hereditary predisposition to cancer. It is still possible that there could be genetic mutations that are undetectable by current technology. There could be genetic mutations in genes that have not been tested or identified to increase cancer risk.  Therefore, it is recommended she continue to follow the cancer management and screening guidelines provided by her oncology and primary healthcare provider.   An individual's cancer risk and medical management are not determined by genetic test results alone. Overall cancer risk assessment incorporates additional factors, including personal medical history, family history, and any available genetic information that may result in a personalized plan for cancer prevention and surveillance.  RECOMMENDATIONS FOR FAMILY MEMBERS:  Relatives in this family might be at some increased risk of developing cancer,  over the general population risk, simply due to the family history of cancer.  We recommended female relatives in this family have a yearly mammogram beginning at age 68, or 19 years younger than the earliest onset of cancer, an annual clinical breast exam, and perform monthly breast self-exams. Female relatives in this family should also have a gynecological exam as recommended by their primary provider.  All family members should be referred for colonoscopy starting at age 77.   FOLLOW-UP: Lastly, we discussed with Ms. Diegel that cancer genetics is a rapidly advancing field and it is possible that new genetic tests will be appropriate for her and/or her family members in the future. We encouraged her to remain in contact with cancer genetics on an annual basis so we can update her personal and family histories and let her know of advances in cancer genetics that may benefit this family.   Our contact number was provided. Ms. Tate questions were answered to her satisfaction, and she knows she is welcome to call us at anytime with additional questions or concerns.   Faith Rogue, MS, [facility] Genetic Counselor [email or web address] Phone: [phone]  The patient was seen for a total of 30 minutes in face-to-face genetic counseling.  [employer] intern Magda Paganini was also present and assisted with this case. Dr. Grayland Ormond was available for discussion regarding this case.    _______________________________________________________________________ For Office Staff:  Number of people involved in session: 2 Was an Intern/ student involved with case: yes

## 2021-02-04 ENCOUNTER — Telehealth: Payer: Self-pay | Admitting: Hospice and Palliative Medicine

## 2021-02-04 ENCOUNTER — Inpatient Hospital Stay: Payer: Medicare PPO

## 2021-02-04 ENCOUNTER — Inpatient Hospital Stay (HOSPITAL_BASED_OUTPATIENT_CLINIC_OR_DEPARTMENT_OTHER): Payer: Medicare PPO | Admitting: Oncology

## 2021-02-04 VITALS — BP 115/72 | HR 90 | Temp 97.6°F | Resp 16 | Ht 69.0 in | Wt 148.4 lb

## 2021-02-04 DIAGNOSIS — Z5111 Encounter for antineoplastic chemotherapy: Secondary | ICD-10-CM

## 2021-02-04 DIAGNOSIS — R5383 Other fatigue: Secondary | ICD-10-CM

## 2021-02-04 DIAGNOSIS — C787 Secondary malignant neoplasm of liver and intrahepatic bile duct: Secondary | ICD-10-CM

## 2021-02-04 DIAGNOSIS — D6481 Anemia due to antineoplastic chemotherapy: Secondary | ICD-10-CM | POA: Diagnosis not present

## 2021-02-04 DIAGNOSIS — C259 Malignant neoplasm of pancreas, unspecified: Secondary | ICD-10-CM | POA: Diagnosis not present

## 2021-02-04 DIAGNOSIS — T451X5A Adverse effect of antineoplastic and immunosuppressive drugs, initial encounter: Secondary | ICD-10-CM

## 2021-02-04 DIAGNOSIS — G893 Neoplasm related pain (acute) (chronic): Secondary | ICD-10-CM

## 2021-02-04 MED ORDER — SODIUM CHLORIDE 0.9% FLUSH
10.0000 mL | INTRAVENOUS | Status: DC | PRN
  Filled 2021-02-04: qty 10

## 2021-02-04 MED ORDER — SODIUM CHLORIDE 0.9 % IV SOLN
1400.0000 mg | Freq: Once | INTRAVENOUS | Status: AC
  Administered 2021-02-04: 1400 mg via INTRAVENOUS
  Filled 2021-02-04: qty 26.3

## 2021-02-04 MED ORDER — HEPARIN SOD (PORK) LOCK FLUSH 100 UNIT/ML IV SOLN
INTRAVENOUS | Status: AC
  Filled 2021-02-04: qty 5

## 2021-02-04 MED ORDER — HEPARIN SOD (PORK) LOCK FLUSH 100 UNIT/ML IV SOLN
500.0000 [IU] | Freq: Once | INTRAVENOUS | Status: AC | PRN
  Administered 2021-02-04: 500 [IU]
  Filled 2021-02-04: qty 5

## 2021-02-04 MED ORDER — PROCHLORPERAZINE MALEATE 10 MG PO TABS
10.0000 mg | ORAL_TABLET | Freq: Once | ORAL | Status: AC
  Administered 2021-02-04: 10 mg via ORAL
  Filled 2021-02-04: qty 1

## 2021-02-04 MED ORDER — SODIUM CHLORIDE 0.9 % IV SOLN
Freq: Once | INTRAVENOUS | Status: AC
  Filled 2021-02-04: qty 250

## 2021-02-04 NOTE — Telephone Encounter (Signed)
Reportedly, patient had some questions about a long-term care insurance policy.  I tried calling patient but did not reach her.  Voicemail left.

## 2021-02-04 NOTE — Progress Notes (Signed)
Pt states that she still gets fatigued after doing little things and has to rest. Other than that she wants to know if she should take the supplements that she has on her list

## 2021-02-08 NOTE — Progress Notes (Signed)
Hematology/Oncology Consult note [facility]  [phone] [phone]  Patient Care Team: Dion Body, MD as PCP - General (Family Medicine) Clent Jacks, RN as Oncology Nurse Navigator   Name of the patient: Rhonda Day  [id number]  Sep 02, 1941   Date of visit: 02/08/21  Diagnosis- metastatic pancreatic cancer with liver metastases  Chief complaint/ Reason for visit-on treatment assessment prior to cycle 1 of gemcitabine chemotherapy  Heme/Onc history: Patient is a 80 year old female who underwent ultrasound abdomen in December 2021 which showed hypoechoic mass in the pancreatic tail measuring 2.1 x 1.8 x 4.5 cm which was not seen on prior CT scans on MRI exams a year ago. This was followed by a CT abdomen and pelvis with contrast which showed ill-defined hypervascular lesion in segment 8 of liver measuring 1.6 x 1.4 cm concerning for metastases. 2 other lesions 1.7 x 1 cm and 1.5 x 0.8 cm were also noted. Large mass in the body of pancreas measuring 5.6 x 2.2 x 2.9 cm. The lesion causes obstruction of the pancreatic duct resulting in ductal dilatation throughout the tail of the pancreas. 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 Splenic vein appears chronically occluded. Lesion also comes in contact with splenic artery which remains patent. This was followed by MRI abdomen which again showed similar findings. Multiple foci of restricted diffusion with hyperenhancement noted in the right hepatic lobe. Prominent portocaval lymph node seen for local nodal metastases along with a 5.3 cm body and tail pancreatic mass  Case was discussed at tumor board and lab was liver biopsy 2 weeks after patient initial Covid test that was positive. However at the day of the biopsy this lesion was in close association with other vascular  structures and given the potential higher risks liver biopsy was aborted and EUS was recommended. She is currently on Cardizem.  Patient had a EUS at [facility] Dr. Mont Dutton. Pathology was consistent with adenocarcinoma. Patient had a PET scan on 12/11/2020 which showed a hypermetabolic lesion in the medial aspect of the liver with an SUV of 4.2 there is a subtle hypodensity on the comparison CT measuring 9 mm at this level. The hypermetabolic lesion in the dome of the liver corresponds to the enhancing lesion on the comparison MRI. 3.4 x 2.7 cm lesion in the mid body of pancreas with an SUV of 5.1. No hypermetabolic peripancreatic lymph nodes.  Patient received 1 cycle of modified FOLFIRINOX chemotherapy on 12/30/2020.  Following that patient had significant nausea vomiting fatigue and declining performance status requiring chemotherapy to be put on hold.  Plan is to restart treatment but switch her to single agent gemcitabine chemotherapy   Interval history-patient feels much improved after taking a break from chemotherapy.  She still has some fatigue which is not back to her baseline self.  But she is independent of her ADLs.  Denies any significant pain  ECOG PS- 1 Pain scale- 0 Opioid associated constipation- no  Review of systems- Review of Systems  Constitutional: Positive for malaise/fatigue.  Gastrointestinal: Positive for nausea.      Allergies  Allergen Reactions  . Sulfa Antibiotics Rash    Other reaction(s): RASH Other reaction(s): RASH      Past Medical History:  Diagnosis Date  . Bladder infection   . Diabetes mellitus without complication ([place])   . Family history of kidney cancer   .  Family history of lung cancer   . Family history of stomach cancer   . Family history of throat cancer   . Hypertension   . Pancreatic cancer [facility])    with liver mass     Past Surgical History:  Procedure Laterality Date  . BACK SURGERY    . PORTA CATH INSERTION N/A  12/27/2020   Procedure: PORTA CATH INSERTION;  Surgeon: Algernon Huxley, MD;  Location: [place] CV LAB;  Service: Cardiovascular;  Laterality: N/A;  . TUBAL LIGATION      Social History   Socioeconomic History  . Marital status: Married    Spouse name: Not on file  . Number of children: Not on file  . Years of education: Not on file  . Highest education level: Not on file  Occupational History  . Not on file  Tobacco Use  . Smoking status: Former Smoker    Quit date: 11/20/1979    Years since quitting: 41.2  . Smokeless tobacco: Never Used  Vaping Use  . Vaping Use: Never used  Substance and Sexual Activity  . Alcohol use: No    Alcohol/week: 0.0 standard drinks  . Drug use: No  . Sexual activity: Never  Other Topics Concern  . Not on file  Social History Narrative  . Not on file   Social Determinants of Health   Financial Resource Strain: Not on file  Food Insecurity: Not on file  Transportation Needs: Not on file  Physical Activity: Not on file  Stress: Not on file  Social Connections: Not on file  Intimate Partner Violence: Not on file    Family History  Problem Relation Age of Onset  . Kidney cancer Father 72  . COPD Mother   . Stomach cancer Maternal Aunt   . Throat cancer Maternal Uncle   . Liver cancer Paternal Uncle   . Esophageal cancer Paternal Grandfather   . Lung cancer Maternal Aunt   . Lung cancer Maternal Uncle   . Lung cancer Cousin   . Kidney cancer Cousin 9  . Testicular cancer Nephew        dx 90s  . Breast cancer Neg Hx      Current Outpatient Medications:  .  azelastine (ASTELIN) 0.1 % nasal spray, Place into both nostrils daily as needed., Disp: , Rfl:  .  azelastine (OPTIVAR) 0.05 % ophthalmic solution, Place into both eyes daily as needed., Disp: , Rfl: 3 .  diltiazem (TIAZAC) 180 MG 24 hr capsule, Take by mouth daily., Disp: , Rfl:  .  gabapentin (NEURONTIN) 100 MG capsule, Take 1 capsule (100 mg total) by mouth at bedtime.,  Disp: 30 capsule, Rfl: 2 .  glimepiride (AMARYL) 2 MG tablet, Take 2 mg by mouth daily with breakfast., Disp: , Rfl:  .  HYDROcodone-acetaminophen (NORCO) 5-325 MG tablet, Take 1 tablet by mouth every 6 (six) hours as needed for moderate pain., Disp: 45 tablet, Rfl: 0 .  losartan (COZAAR) 100 MG tablet, Take 100 mg by mouth daily., Disp: , Rfl:  .  metFORMIN (GLUCOPHAGE-XR) 500 MG 24 hr tablet, take 1 tablet by mouth once daily with DINNER, Disp: , Rfl: 0 .  morphine (MS CONTIN) 15 MG 12 hr tablet, Take 1 tablet (15 mg total) by mouth every 12 (twelve) hours., Disp: 60 tablet, Rfl: 0 .  ONE TOUCH ULTRA TEST test strip, , Disp: , Rfl: 1 .  ONETOUCH DELICA LANCETS 16X MISC, 1 each by XX route as directed. Check  CBG's fasting once daily. Dx: E11.9, Disp: , Rfl:  .  traZODone (DESYREL) 50 MG tablet, Take 0.5-1 tablets (25-50 mg total) by mouth at bedtime as needed for sleep., Disp: 30 tablet, Rfl: 0 .  ferrous sulfate 325 (65 FE) MG EC tablet, Take by mouth. (Patient not taking: No sig reported), Disp: , Rfl:  .  LORazepam (ATIVAN) 0.5 MG tablet, Take 1 tablet (0.5 mg total) by mouth every 8 (eight) hours. (Patient not taking: No sig reported), Disp: 60 tablet, Rfl: 0  Physical exam:  Vitals:   02/04/21 1327  BP: 115/72  Pulse: 90  Resp: 16  Temp: 97.6 F (36.4 C)  TempSrc: Oral  Weight: 148 lb 6.4 oz (67.3 kg)  Height: 5\' 9"  (1.753 m)   Physical Exam Constitutional:      General: She is not in acute distress. Cardiovascular:     Rate and Rhythm: Normal rate and regular rhythm.     Heart sounds: Normal heart sounds.  Pulmonary:     Effort: Pulmonary effort is normal.     Breath sounds: Normal breath sounds.  Abdominal:     General: Bowel sounds are normal.     Palpations: Abdomen is soft.  Skin:    General: Skin is warm and dry.  Neurological:     Mental Status: She is alert and oriented to person, place, and time.      CMP Latest Ref Rng & Units 01/27/2021  Glucose 70 - 99  mg/dL 390(H)  BUN 8 - 23 mg/dL 29(H)  Creatinine 0.44 - 1.00 mg/dL 1.24(H)  Sodium 135 - 145 mmol/L 133(L)  Potassium 3.5 - 5.1 mmol/L 4.4  Chloride 98 - 111 mmol/L 98  CO2 22 - 32 mmol/L 24  Calcium 8.9 - 10.3 mg/dL 9.3  Total Protein 6.5 - 8.1 g/dL 6.7  Total Bilirubin 0.3 - 1.2 mg/dL 0.4  Alkaline Phos 38 - 126 U/L 92  AST 15 - 41 U/L 26  ALT 0 - 44 U/L 27   CBC Latest Ref Rng & Units 01/27/2021  WBC 4.0 - 10.5 K/uL 9.4  Hemoglobin 12.0 - 15.0 g/dL 10.0(L)  Hematocrit 36.0 - 46.0 % 31.6(L)  Platelets 150 - 400 K/uL 274    No images are attached to the encounter.  No results found.   Assessment and plan- Patient is a 80 y.o. female with stage IV pancreatic adenocarcinoma with liver metastases here for on treatment assessment prior to cycle 1 of gemcitabine chemotherapy  Counts okay to proceed with cycle 1 of gemcitabine chemotherapy today.  She will directly proceed with gemcitabine chemotherapy next week and will receive possible IV fluids.  I will see her back in 3 weeks with CBC with differential, CMP and CA 19-9 for cycle 2-day 1 of gemcitabine.  She is receiving gemcitabine at a reduced dose of 800 mg per metered square  Neoplasm related pain: Continue as needed oxycodone  Normocytic anemia possibly secondary to chemotherapy.  Will check iron studies B12 and folate at next visit   Visit Diagnosis 1. Pancreatic cancer metastasized to liver ([place])   2. Encounter for antineoplastic chemotherapy   3. Chemotherapy-induced fatigue   4. Antineoplastic chemotherapy induced anemia   5. Neoplasm related pain      Dr. Randa Evens, MD, MPH [facility] at [facility] [phone] 02/08/2021 11:20 AM

## 2021-02-10 ENCOUNTER — Inpatient Hospital Stay (HOSPITAL_BASED_OUTPATIENT_CLINIC_OR_DEPARTMENT_OTHER): Payer: Medicare PPO | Admitting: Hospice and Palliative Medicine

## 2021-02-10 DIAGNOSIS — C259 Malignant neoplasm of pancreas, unspecified: Secondary | ICD-10-CM | POA: Diagnosis not present

## 2021-02-10 DIAGNOSIS — C787 Secondary malignant neoplasm of liver and intrahepatic bile duct: Secondary | ICD-10-CM | POA: Diagnosis not present

## 2021-02-10 NOTE — Progress Notes (Signed)
Virtual Visit via Telephone Note  I connected with Rhonda Day on 02/10/21 at  1:00 PM EDT by telephone and verified that I am speaking with the correct person using two identifiers.  Location: Patient: home Provider: clinic   I discussed the limitations, risks, security and privacy concerns of performing an evaluation and management service by telephone and the availability of in person appointments. I also discussed with the patient that there may be a patient responsible charge related to this service. The patient expressed understanding and agreed to proceed.   History of Present Illness: Rhonda Day is a 80 y.o. 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  Patient was found to have a pancreatic tail mass in December 2021.  She ultimately underwent EUS and biopsy with pathology consistent for adenocarcinoma.  Patient is referred to palliative care to help address goals and manage ongoing symptoms.   Observations/Objective: I called and spoke with patient and then her husband by phone.  Both patient and husband had questions regarding a long-term care insurance policy and whether it makes clinical sense for them to continue paying for this monthly.  They endorse some financial insecurity that could be helped by alleviating the $165 monthly cost of the policy.  We talked at length about the pros and cons of having access to additional resources in the future to facilitate her care at home.  There is a 90-day wait period on initiation of the policy.  Husband would like for someone to sit down and go over the details of this in person.  We will consult community-based palliative care social worker.  I will also consult our financial navigator to discuss financial resources.  We talked at length about future option of hospice care.  Husband had questions about what services hospice would provide in the  future.  Assessment and Plan: Stage IV pancreatic cancer -on systemic chemotherapy.  Doing well symptomatically.  Referrals for community palliative care social work and Hotel manager.  Follow Up Instructions: RTC in 2 weeks   I discussed the assessment and treatment plan with the patient. The patient was provided an opportunity to ask questions and all were answered. The patient agreed with the plan and demonstrated an understanding of the instructions.   The patient was advised to call back or seek an in-person evaluation if the symptoms worsen or if the condition fails to improve as anticipated.  I provided 30 minutes of non-face-to-face time during this encounter.   Irean Hong, NP

## 2021-02-11 ENCOUNTER — Other Ambulatory Visit: Payer: Self-pay

## 2021-02-11 ENCOUNTER — Other Ambulatory Visit: Payer: Self-pay | Admitting: Oncology

## 2021-02-11 ENCOUNTER — Inpatient Hospital Stay: Payer: Medicare PPO

## 2021-02-11 ENCOUNTER — Telehealth: Payer: Self-pay | Admitting: Adult Health Nurse Practitioner

## 2021-02-11 VITALS — BP 125/58 | HR 69 | Temp 96.6°F | Resp 16

## 2021-02-11 DIAGNOSIS — Z95828 Presence of other vascular implants and grafts: Secondary | ICD-10-CM

## 2021-02-11 DIAGNOSIS — Z5111 Encounter for antineoplastic chemotherapy: Secondary | ICD-10-CM | POA: Diagnosis not present

## 2021-02-11 DIAGNOSIS — C259 Malignant neoplasm of pancreas, unspecified: Secondary | ICD-10-CM

## 2021-02-11 LAB — CBC WITH DIFFERENTIAL/PLATELET
Abs Immature Granulocytes: 0.05 10*3/uL (ref 0.00–0.07)
Basophils Absolute: 0 10*3/uL (ref 0.0–0.1)
Basophils Relative: 0 %
Eosinophils Absolute: 0 10*3/uL (ref 0.0–0.5)
Eosinophils Relative: 1 %
HCT: 30.1 % — ABNORMAL LOW (ref 36.0–46.0)
Hemoglobin: 9.6 g/dL — ABNORMAL LOW (ref 12.0–15.0)
Immature Granulocytes: 1 %
Lymphocytes Relative: 39 %
Lymphs Abs: 2 10*3/uL (ref 0.7–4.0)
MCH: 28.2 pg (ref 26.0–34.0)
MCHC: 31.9 g/dL (ref 30.0–36.0)
MCV: 88.3 fL (ref 80.0–100.0)
Monocytes Absolute: 0.5 10*3/uL (ref 0.1–1.0)
Monocytes Relative: 9 %
Neutro Abs: 2.6 10*3/uL (ref 1.7–7.7)
Neutrophils Relative %: 50 %
Platelets: 276 10*3/uL (ref 150–400)
RBC: 3.41 MIL/uL — ABNORMAL LOW (ref 3.87–5.11)
RDW: 15.7 % — ABNORMAL HIGH (ref 11.5–15.5)
WBC: 5.2 10*3/uL (ref 4.0–10.5)
nRBC: 0 % (ref 0.0–0.2)

## 2021-02-11 LAB — COMPREHENSIVE METABOLIC PANEL
ALT: 34 U/L (ref 0–44)
AST: 24 U/L (ref 15–41)
Albumin: 3.8 g/dL (ref 3.5–5.0)
Alkaline Phosphatase: 98 U/L (ref 38–126)
Anion gap: 11 (ref 5–15)
BUN: 29 mg/dL — ABNORMAL HIGH (ref 8–23)
CO2: 23 mmol/L (ref 22–32)
Calcium: 9.6 mg/dL (ref 8.9–10.3)
Chloride: 106 mmol/L (ref 98–111)
Creatinine, Ser: 1.09 mg/dL — ABNORMAL HIGH (ref 0.44–1.00)
GFR, Estimated: 52 mL/min — ABNORMAL LOW (ref >60)
Glucose, Bld: 163 mg/dL — ABNORMAL HIGH (ref 70–99)
Potassium: 4 mmol/L (ref 3.5–5.1)
Sodium: 140 mmol/L (ref 135–145)
Total Bilirubin: 0.2 mg/dL — ABNORMAL LOW (ref 0.3–1.2)
Total Protein: 7.2 g/dL (ref 6.5–8.1)

## 2021-02-11 LAB — FOLATE: Folate: 27 ng/mL (ref >5.9)

## 2021-02-11 LAB — FERRITIN: Ferritin: 54 ng/mL (ref 11–307)

## 2021-02-11 LAB — IRON AND TIBC
Iron: 40 ug/dL (ref 28–170)
Saturation Ratios: 11 % (ref 10.4–31.8)
TIBC: 371 ug/dL (ref 250–450)
UIBC: 331 ug/dL

## 2021-02-11 MED ORDER — SODIUM CHLORIDE 0.9 % IV SOLN
Freq: Once | INTRAVENOUS | Status: AC
  Filled 2021-02-11: qty 250

## 2021-02-11 MED ORDER — SODIUM CHLORIDE 0.9% FLUSH
10.0000 mL | Freq: Once | INTRAVENOUS | Status: AC
  Administered 2021-02-11: 10 mL via INTRAVENOUS
  Filled 2021-02-11: qty 10

## 2021-02-11 MED ORDER — SODIUM CHLORIDE 0.9 % IV SOLN
1400.0000 mg | Freq: Once | INTRAVENOUS | Status: AC
  Administered 2021-02-11: 1400 mg via INTRAVENOUS
  Filled 2021-02-11: qty 26.3

## 2021-02-11 MED ORDER — HEPARIN SOD (PORK) LOCK FLUSH 100 UNIT/ML IV SOLN
INTRAVENOUS | Status: AC
  Filled 2021-02-11: qty 5

## 2021-02-11 MED ORDER — PROCHLORPERAZINE MALEATE 10 MG PO TABS
10.0000 mg | ORAL_TABLET | Freq: Once | ORAL | Status: AC
  Administered 2021-02-11: 10 mg via ORAL
  Filled 2021-02-11: qty 1

## 2021-02-11 MED ORDER — HEPARIN SOD (PORK) LOCK FLUSH 100 UNIT/ML IV SOLN
500.0000 [IU] | Freq: Once | INTRAVENOUS | Status: AC
Start: 2021-02-11 — End: 2021-02-11
  Administered 2021-02-11: 500 [IU] via INTRAVENOUS
  Filled 2021-02-11: qty 5

## 2021-02-11 NOTE — Telephone Encounter (Signed)
Spoke with patient's husband, Marcello Moores, regarding the Palliative referral/services and all questions were answered and he was in agreement with scheduling visit with NP.l  I have scheduled an In-home Consult for 02/20/21 @ 9 AM

## 2021-02-11 NOTE — Patient Instructions (Signed)
[facility]  Discharge Instructions: Thank you for choosing [facility] to provide your oncology and hematology care.  If you have a lab appointment with the [facility], please go directly to the [facility] and check in at the registration area.  Wear comfortable clothing and clothing appropriate for easy access to any Portacath or PICC line.   We strive to give you quality time with your provider. You may need to reschedule your appointment if you arrive late (15 or more minutes).  Arriving late affects you and other patients whose appointments are after yours.  Also, if you miss three or more appointments without notifying the office, you may be dismissed from the clinic at the provider's discretion.      For prescription refill requests, have your pharmacy contact our office and allow 72 hours for refills to be completed.    Today you received the following chemotherapy and/or immunotherapy agents GemcitabineGemcitabine injection What is this medicine? GEMCITABINE (jem SYE ta been) is a chemotherapy drug. This medicine is used to treat many types of cancer like breast cancer, lung cancer, pancreatic cancer, and ovarian cancer. This medicine may be used for other purposes; ask your health care provider or pharmacist if you have questions. COMMON BRAND NAME(S): Gemzar, Infugem What should I tell my health care provider before I take this medicine? They need to know if you have any of these conditions:  blood disorders  infection  kidney disease  liver disease  lung or breathing disease, like asthma  recent or ongoing radiation therapy  an unusual or allergic reaction to gemcitabine, other chemotherapy, other medicines, foods, dyes, or preservatives  pregnant or trying to get pregnant  breast-feeding How should I use this medicine? This drug is given as an infusion into a vein. It is administered in a hospital or clinic by a  specially trained health care professional. Talk to your pediatrician regarding the use of this medicine in children. Special care may be needed. Overdosage: If you think you have taken too much of this medicine contact a poison control center or emergency room at once. NOTE: This medicine is only for you. Do not share this medicine with others. What if I miss a dose? It is important not to miss your dose. Call your doctor or health care professional if you are unable to keep an appointment. What may interact with this medicine?  medicines to increase blood counts like filgrastim, pegfilgrastim, sargramostim  some other chemotherapy drugs like cisplatin  vaccines Talk to your doctor or health care professional before taking any of these medicines:  acetaminophen  aspirin  ibuprofen  ketoprofen  naproxen This list may not describe all possible interactions. Give your health care provider a list of all the medicines, herbs, non-prescription drugs, or dietary supplements you use. Also tell them if you smoke, drink alcohol, or use illegal drugs. Some items may interact with your medicine. What should I watch for while using this medicine? Visit your doctor for checks on your progress. This drug may make you feel generally unwell. This is not uncommon, as chemotherapy can affect healthy cells as well as cancer cells. Report any side effects. Continue your course of treatment even though you feel ill unless your doctor tells you to stop. In some cases, you may be given additional medicines to help with side effects. Follow all directions for their use. Call your doctor or health care professional for advice if you get a  fever, chills or sore throat, or other symptoms of a cold or flu. Do not treat yourself. This drug decreases your body's ability to fight infections. Try to avoid being around people who are sick. This medicine may increase your risk to bruise or bleed. Call your doctor or  health care professional if you notice any unusual bleeding. Be careful brushing and flossing your teeth or using a toothpick because you may get an infection or bleed more easily. If you have any dental work done, tell your dentist you are receiving this medicine. Avoid taking products that contain aspirin, acetaminophen, ibuprofen, naproxen, or ketoprofen unless instructed by your doctor. These medicines may hide a fever. Do not become pregnant while taking this medicine or for 6 months after stopping it. Women should inform their doctor if they wish to become pregnant or think they might be pregnant. Men should not father a child while taking this medicine and for 3 months after stopping it. There is a potential for serious side effects to an unborn child. Talk to your health care professional or pharmacist for more information. Do not breast-feed an infant while taking this medicine or for at least 1 week after stopping it. Men should inform their doctors if they wish to father a child. This medicine may lower sperm counts. Talk with your doctor or health care professional if you are concerned about your fertility. What side effects may I notice from receiving this medicine? Side effects that you should report to your doctor or health care professional as soon as possible:  allergic reactions like skin rash, itching or hives, swelling of the face, lips, or tongue  breathing problems  pain, redness, or irritation at site where injected  signs and symptoms of a dangerous change in heartbeat or heart rhythm like chest pain; dizziness; fast or irregular heartbeat; palpitations; feeling faint or lightheaded, falls; breathing problems  signs of decreased platelets or bleeding - bruising, pinpoint red spots on the skin, black, tarry stools, blood in the urine  signs of decreased red blood cells - unusually weak or tired, feeling faint or lightheaded, falls  signs of infection - fever or chills,  cough, sore throat, pain or difficulty passing urine  signs and symptoms of kidney injury like trouble passing urine or change in the amount of urine  signs and symptoms of liver injury like dark yellow or brown urine; general ill feeling or flu-like symptoms; light-colored stools; loss of appetite; nausea; right upper belly pain; unusually weak or tired; yellowing of the eyes or skin  swelling of ankles, feet, hands Side effects that usually do not require medical attention (report to your doctor or health care professional if they continue or are bothersome):  constipation  diarrhea  hair loss  loss of appetite  nausea  rash  vomiting This list may not describe all possible side effects. Call your doctor for medical advice about side effects. You may report side effects to FDA at 1-800-FDA-1088. Where should I keep my medicine? This drug is given in a hospital or clinic and will not be stored at home. NOTE: This sheet is a summary. It may not cover all possible information. If you have questions about this medicine, talk to your doctor, pharmacist, or health care provider.  2021 Elsevier/Gold Standard (2017-12-29 18:06:11)       To help prevent nausea and vomiting after your treatment, we encourage you to take your nausea medication as directed.  BELOW ARE SYMPTOMS THAT SHOULD BE REPORTED  IMMEDIATELY: . *FEVER GREATER THAN 100.4 F (38 C) OR HIGHER . *CHILLS OR SWEATING . *NAUSEA AND VOMITING THAT IS NOT CONTROLLED WITH YOUR NAUSEA MEDICATION . *UNUSUAL SHORTNESS OF BREATH . *UNUSUAL BRUISING OR BLEEDING . *URINARY PROBLEMS (pain or burning when urinating, or frequent urination) . *BOWEL PROBLEMS (unusual diarrhea, constipation, pain near the anus) . TENDERNESS IN MOUTH AND THROAT WITH OR WITHOUT PRESENCE OF ULCERS (sore throat, sores in mouth, or a toothache) . UNUSUAL RASH, SWELLING OR PAIN  . UNUSUAL VAGINAL DISCHARGE OR ITCHING   Items with * indicate a potential  emergency and should be followed up as soon as possible or go to the Emergency Department if any problems should occur.  Please show the CHEMOTHERAPY ALERT CARD or IMMUNOTHERAPY ALERT CARD at check-in to the Emergency Department and triage nurse.  Should you have questions after your visit or need to cancel or reschedule your appointment, please contact [facility]  [phone] and follow the prompts.  Office hours are 8:00 a.m. to 4:30 p.m. Monday - Friday. Please note that voicemails left after 4:00 p.m. may not be returned until the following business day.  We are closed weekends and major holidays. You have access to a nurse at all times for urgent questions. Please call the main number to the clinic [phone] and follow the prompts.  For any non-urgent questions, you may also contact your provider using MyChart. We now offer e-Visits for anyone 32 and older to request care online for non-urgent symptoms. For details visit [email or web address].   Also download the MyChart app! Go to the app store, search "MyChart", open the app, select [facility], and log in with your MyChart username and password.  Due to Covid, a mask is required upon entering the hospital/clinic. If you do not have a mask, one will be given to you upon arrival. For doctor visits, patients may have 1 support person aged 30 or older with them. For treatment visits, patients cannot have anyone with them due to current Covid guidelines and our immunocompromised population.

## 2021-02-17 ENCOUNTER — Other Ambulatory Visit: Payer: Self-pay | Admitting: *Deleted

## 2021-02-17 MED ORDER — HYDROCODONE-ACETAMINOPHEN 5-325 MG PO TABS: 1.0000 | ORAL_TABLET | Freq: Four times a day (QID) | ORAL | 0 refills | Status: DC | PRN

## 2021-02-17 NOTE — Telephone Encounter (Signed)
Patient states she developed a UTI over weekend and would like to discuss with you.

## 2021-02-18 ENCOUNTER — Inpatient Hospital Stay: Payer: Medicare PPO | Attending: Oncology

## 2021-02-18 DIAGNOSIS — Z7984 Long term (current) use of oral hypoglycemic drugs: Secondary | ICD-10-CM | POA: Insufficient documentation

## 2021-02-18 DIAGNOSIS — Z87891 Personal history of nicotine dependence: Secondary | ICD-10-CM | POA: Insufficient documentation

## 2021-02-18 DIAGNOSIS — Z79899 Other long term (current) drug therapy: Secondary | ICD-10-CM | POA: Insufficient documentation

## 2021-02-18 DIAGNOSIS — C787 Secondary malignant neoplasm of liver and intrahepatic bile duct: Secondary | ICD-10-CM | POA: Insufficient documentation

## 2021-02-18 DIAGNOSIS — I10 Essential (primary) hypertension: Secondary | ICD-10-CM | POA: Insufficient documentation

## 2021-02-18 DIAGNOSIS — T451X5A Adverse effect of antineoplastic and immunosuppressive drugs, initial encounter: Secondary | ICD-10-CM | POA: Insufficient documentation

## 2021-02-18 DIAGNOSIS — C251 Malignant neoplasm of body of pancreas: Secondary | ICD-10-CM | POA: Insufficient documentation

## 2021-02-18 DIAGNOSIS — Z5111 Encounter for antineoplastic chemotherapy: Secondary | ICD-10-CM | POA: Insufficient documentation

## 2021-02-18 DIAGNOSIS — D6481 Anemia due to antineoplastic chemotherapy: Secondary | ICD-10-CM | POA: Insufficient documentation

## 2021-02-18 DIAGNOSIS — E119 Type 2 diabetes mellitus without complications: Secondary | ICD-10-CM | POA: Insufficient documentation

## 2021-02-18 NOTE — Progress Notes (Signed)
Nutrition Follow-up:  Patient with metastatic pancreatic cancer with metastatic disease to liver.  Patient receiving gemcitabine.  Met with patient in clinic for nutrition follow-up.  Patient reports that appetite is good. Breakfast is cereal with fruit. Lunch is sandwich or salad. Evening meal is lighter, maybe soups, eggs and biscuit.  Has been craving cottage cheese and tomatoes recently.    Denies any nutrition impact symptoms at this time    Medications: metformin  Labs: glucose 163 (4/29)  Anthropometrics:   Weight 148 lb 6.4 oz on 4/19 increased from 146 lb on 4/11 UBW 150s   NUTRITION DIAGNOSIS: Inadequate oral intake improving   INTERVENTION:  Continue eating well balanced diet including good sources of protein.     MONITORING, EVALUATION, GOAL: weight trends, intake   NEXT VISIT: to be determined with treatment  Tharon Bomar B. Zenia Resides, [place], [place] Registered Dietitian [phone] (mobile)

## 2021-02-20 ENCOUNTER — Other Ambulatory Visit: Payer: Self-pay

## 2021-02-20 ENCOUNTER — Other Ambulatory Visit: Payer: Medicare PPO | Admitting: Adult Health Nurse Practitioner

## 2021-02-20 ENCOUNTER — Encounter: Payer: Self-pay | Admitting: Adult Health Nurse Practitioner

## 2021-02-20 DIAGNOSIS — Z515 Encounter for palliative care: Secondary | ICD-10-CM

## 2021-02-20 DIAGNOSIS — C259 Malignant neoplasm of pancreas, unspecified: Secondary | ICD-10-CM

## 2021-02-20 DIAGNOSIS — C787 Secondary malignant neoplasm of liver and intrahepatic bile duct: Secondary | ICD-10-CM

## 2021-02-20 DIAGNOSIS — R300 Dysuria: Secondary | ICD-10-CM

## 2021-02-20 NOTE — Progress Notes (Signed)
[facility] Palliative Care Consult Note Telephone: [phone]  Fax: [phone]    Date of encounter: 02/20/21 PATIENT NAME: Rhonda Day [id number]   [phone] (home)  DOB: 1941/03/04 MRN: [id number] PRIMARY CARE PROVIDER:    Dion Body, Day,  [place] [address] [phone]  REFERRING PROVIDER:   Billey Chang Day  RESPONSIBLE PARTY:    Contact Information    Name Relation Home Work Mobile   Day,Rhonda Spouse [phone]         I met face to face with patient and family in home. Palliative Care was asked to follow this patient by consultation request of  Rhonda Day to address advance care planning and complex medical decision making. This is the initial visit.   Husband present during visit today                                    ASSESSMENT AND PLAN / RECOMMENDATIONS:   Advance Care Planning/Goals of Care: Goals include to maximize quality of life and symptom management. Our advance care planning conversation included a discussion about:   Spent at least 30 minutes discussing ACP.  Filled out DNR and MOST form today.  Uploaded documents to [facility] and left originals in the home.    The value and importance of advance care planning  Exploration of goals of care in the event of a sudden injury or illness   Identification and preparation of a healthcare agent   Review and updating or creation of an  advance directive document .  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  She is doing better with just the gemcitabine.  Continue follow-up and recommendations by oncology  Dysuria: Getting good relief with AZO and cranberry juice.  Have also encouraged drinking plenty of water to stay hydrated.  Support: Patient and husband had many questions about what to expect and  attempted to answer their questions.  They also had questions about long-term care policy and agencies for in-home help.  They are working with a Education officer, museum through the cancer center to help with these questions.  We will put in social work referral in the future if needed.   Follow up Palliative Care Visit: Palliative care will continue to follow for complex medical decision making, advance care planning, and clarification of goals. Prefers telephone check-in in 2 months.  Encouraged to call with any questions or concerns   This visit was coded based on medical decision making (MDM).  PPS: 60%  HOSPICE ELIGIBILITY/DIAGNOSIS: TBD  Chief Complaint: initial palliative visit  HISTORY OF PRESENT ILLNESS:  Rhonda Day is a 80 y.o. year old female  with pancreatic cancer with mets to liver, a-fib, DMT2, HTN, DDD, CKD stage 3, anemia, HLD.  Patient lives at home with her husband.  Patient was diagnosed with stage IV pancreatic cancer with cancer with liver mets in December 2021.  She was started on dual chemotherapy and this made her really sick.  She is currently just on one therapy with gemcitabine.  She seems to be tolerating this well though endorses fatigue.  Patient is getting fatigue related to her cancer treatments and she does conserve her energy by taking frequent rest breaks.  She was in the ED on 11/19/2020 for A. fib which  converted back to normal sinus rhythm.  Patient has been getting PT/OT at home which they state is about to wrap up.  Patient states having dysuria for past few days and denies any other associated symptoms such as flank pain, nausea, fever.  States that she is had this before and will usually take cranberry juice and over-the-counter AZO to help relieve the symptoms.  She is taking cranberry juice and the AZO now and is getting relief with her symptoms.  She does endorse that every now and then she will have heart palpitations and does state that she gets relief if she  tries to calm herself.  She is being followed by Dr. Nehemiah Massed for this.  Patient is able to walk independent of assistive devices and is able to perform her own personal care and ADLs.  Does have fatigue and needs frequent rest breaks but can help with IADLs.  Rest of 10 point ROS asked and negative except what is stated in HPI  History obtained from review of EMR and interview with family and Ms. Siefken.  I reviewed available labs, medications, imaging, studies and related documents from the EMR.  Records reviewed and summarized above.   Physical Exam:  Constitutional: NAD General:  thin EYES: anicteric sclera, lids intact, no discharge  ENMT: intact hearing, oral mucous membranes moist CV: no LE edema Pulmonary:  no increased work of breathing, no cough KKX:FGHWE all extremities, ambulatory Skin:  no rashes on visible skin Neuro:  A and O x 3 Hem/lymph/immuno: no widespread bruising   CURRENT PROBLEM LIST:  Patient Active Problem List   Diagnosis Date Noted  . Genetic testing 01/28/2021  . Family history of stomach cancer   . Family history of kidney cancer   . Family history of lung cancer   . Family history of throat cancer   . Cancer associated pain 01/10/2021  . Convalescence following chemotherapy 01/10/2021  . Controlled type 2 diabetes mellitus with chronic kidney disease, without long-term current use of insulin ([place]) 01/09/2021  . Pancreatic cancer metastasized to liver ([place]) 12/13/2020  . Goals of care, counseling/discussion 12/13/2020  . Abnormal ECG 12/09/2020  . Hyperlipidemia, mixed 12/09/2020  . Paroxysmal A-fib ([place]) 12/09/2020  . Allergic rhinitis 12/02/2020  . Chronic allergic conjunctivitis 12/02/2020  . Vasomotor rhinitis 12/02/2020  . Stage 3b chronic kidney disease ([place]) 03/23/2020  . Type 2 diabetes mellitus with diabetic chronic kidney disease ([place]) 03/23/2020  . Anemia of chronic disease 01/31/2020  . Chronic pain syndrome 12/16/2017  . Degeneration  of lumbar intervertebral disc 12/16/2017  . Lumbar post-laminectomy syndrome 12/16/2017  . B12 deficiency 09/30/2017  . 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 Vaginal spotting 05/11/2017  . Chronic cystitis 01/26/2017  . 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 Dysuria 12/23/2016  . Medicare annual wellness visit, subsequent 09/29/2016  . History of chronic health problem 05/11/2016  . History of atrial fibrillation 04/09/2016  . Controlled type 2 diabetes mellitus without complication ([place]) 99/37/1696  . Type 2 diabetes mellitus ([place]) 11/15/2015  . Essential hypertension 11/15/2015  . Acute urinary tract infection 09/14/2014  . Incomplete bladder emptying 11/16/2012  . Female stress incontinence 11/11/2012  . Low back pain 11/11/2012  . Low bladder compliance 11/11/2012  . Other specified symptom associated with female genital organs 11/11/2012  . Symptoms involving urinary system 11/11/2012   PAST MEDICAL HISTORY:  Active Ambulatory Problems    Diagnosis Date Noted  . Acute urinary tract infection 09/14/2014  . Type 2 diabetes mellitus (  [place]) 11/15/2015  . Essential hypertension 11/15/2015  . Female stress incontinence 11/11/2012  . Incomplete bladder emptying 11/16/2012  . Low back pain 11/11/2012  . Low bladder compliance 11/11/2012  . Other specified symptom associated with female genital organs 11/11/2012  . Symptoms involving urinary system 11/11/2012  . Controlled type 2 diabetes mellitus without complication ([place]) 82/95/6213  . 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 Allergic rhinitis 12/02/2020  . Anemia of chronic disease 01/31/2020  . 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 B12 deficiency 09/30/2017  . Chronic allergic conjunctivitis 12/02/2020  . Chronic cystitis 01/26/2017  . Chronic pain syndrome 12/16/2017  . Degeneration of lumbar intervertebral disc 12/16/2017  . Dysuria 12/23/2016  . History of atrial fibrillation 04/09/2016  . History of chronic health  problem 05/11/2016  . Lumbar post-laminectomy syndrome 12/16/2017  . Medicare annual wellness visit, subsequent 09/29/2016  . Stage 3b chronic kidney disease ([place]) 03/23/2020  . Vaginal spotting 05/11/2017  . Vasomotor rhinitis 12/02/2020  . Pancreatic cancer metastasized to liver ([place]) 12/13/2020  . Goals of care, counseling/discussion 12/13/2020  . Abnormal ECG 12/09/2020  . Hyperlipidemia, mixed 12/09/2020  . Paroxysmal A-fib ([place]) 12/09/2020  . Controlled type 2 diabetes mellitus with chronic kidney disease, without long-term current use of insulin ([place]) 01/09/2021  . Type 2 diabetes mellitus with diabetic chronic kidney disease ([place]) 03/23/2020  . Cancer associated pain 01/10/2021  . Convalescence following chemotherapy 01/10/2021  . Family history of stomach cancer   . Family history of kidney cancer   . Family history of lung cancer   . Family history of throat cancer   . Genetic testing 01/28/2021   Resolved Ambulatory Problems    Diagnosis Date Noted  . No Resolved Ambulatory Problems   Past Medical History:  Diagnosis Date  . Bladder infection   . Diabetes mellitus without complication ([place])   . Hypertension   . Pancreatic cancer [facility])    SOCIAL HX:  Social History   Tobacco Use  . Smoking status: Former Smoker    Quit date: 11/20/1979    Years since quitting: 41.2  . Smokeless tobacco: Never Used  Substance Use Topics  . Alcohol use: No    Alcohol/week: 0.0 standard drinks   FAMILY HX:  Family History  Problem Relation Age of Onset  . Kidney cancer Father 61  . COPD Mother   . Stomach cancer Maternal Aunt   . Throat cancer Maternal Uncle   . Liver cancer Paternal Uncle   . Esophageal cancer Paternal Grandfather   . Lung cancer Maternal Aunt   . Lung cancer Maternal Uncle   . Lung cancer Cousin   . Kidney cancer Cousin 60  . Testicular cancer Nephew        dx 46s  . Breast cancer Neg Hx       ALLERGIES:  Allergies  Allergen Reactions  . Sulfa  Antibiotics Rash    Other reaction(s): RASH Other reaction(s): RASH      PERTINENT MEDICATIONS:  Outpatient Encounter Medications as of 02/20/2021  Medication Sig  . azelastine (ASTELIN) 0.1 % nasal spray Place into both nostrils daily as needed.  Marland Kitchen azelastine (OPTIVAR) 0.05 % ophthalmic solution Place into both eyes daily as needed.  . diltiazem (TIAZAC) 180 MG 24 hr capsule Take by mouth daily.  . ferrous sulfate 325 (65 FE) MG EC tablet Take by mouth. (Patient not taking: No sig reported)  . gabapentin (NEURONTIN) 100 MG capsule Take 1 capsule (100 mg total) by mouth at  bedtime.  Marland Kitchen glimepiride (AMARYL) 2 MG tablet Take 2 mg by mouth daily with breakfast.  . HYDROcodone-acetaminophen (NORCO) 5-325 MG tablet Take 1 tablet by mouth every 6 (six) hours as needed for moderate pain.  Marland Kitchen LORazepam (ATIVAN) 0.5 MG tablet Take 1 tablet (0.5 mg total) by mouth every 8 (eight) hours. (Patient not taking: No sig reported)  . losartan (COZAAR) 100 MG tablet Take 100 mg by mouth daily.  . metFORMIN (GLUCOPHAGE-XR) 500 MG 24 hr tablet take 1 tablet by mouth once daily with DINNER  . morphine (MS CONTIN) 15 MG 12 hr tablet Take 1 tablet (15 mg total) by mouth every 12 (twelve) hours.  . ONE TOUCH ULTRA TEST test strip   . ONETOUCH DELICA LANCETS 40N MISC 1 each by XX route as directed. Check CBG's fasting once daily. Dx: E11.9  . traZODone (DESYREL) 50 MG tablet Take 0.5-1 tablets (25-50 mg total) by mouth at bedtime as needed for sleep.  . [DISCONTINUED] prochlorperazine (COMPAZINE) 10 MG tablet Take 1 tablet (10 mg total) by mouth every 6 (six) hours as needed (Nausea or vomiting). (Patient not taking: No sig reported)   No facility-administered encounter medications on file as of 02/20/2021.    Thank you for the opportunity to participate in the care of Ms. Delauter.  The palliative care team will continue to follow. Please call our office at [phone] if we can be of additional assistance.   Edras Wilford Jenetta Downer, Day , DNP  This chart was dictated using voice recognition software. Despite best efforts to proofread, errors can occur which can change the documentation meaning.   COVID-19 PATIENT SCREENING TOOL Asked and negative response unless otherwise noted:   Have you had symptoms of covid, tested positive or been in contact with someone with symptoms/positive test in the past 5-10 days? negative

## 2021-02-25 ENCOUNTER — Inpatient Hospital Stay: Payer: Medicare PPO

## 2021-02-25 ENCOUNTER — Inpatient Hospital Stay (HOSPITAL_BASED_OUTPATIENT_CLINIC_OR_DEPARTMENT_OTHER): Payer: Medicare PPO | Admitting: Oncology

## 2021-02-25 ENCOUNTER — Inpatient Hospital Stay: Payer: Medicare PPO | Admitting: Hospice and Palliative Medicine

## 2021-02-25 ENCOUNTER — Encounter: Payer: Self-pay | Admitting: Oncology

## 2021-02-25 VITALS — BP 115/59 | HR 87 | Temp 97.6°F | Resp 18 | Wt 153.0 lb

## 2021-02-25 DIAGNOSIS — C787 Secondary malignant neoplasm of liver and intrahepatic bile duct: Secondary | ICD-10-CM

## 2021-02-25 DIAGNOSIS — C259 Malignant neoplasm of pancreas, unspecified: Secondary | ICD-10-CM

## 2021-02-25 DIAGNOSIS — I10 Essential (primary) hypertension: Secondary | ICD-10-CM | POA: Diagnosis not present

## 2021-02-25 DIAGNOSIS — Z5111 Encounter for antineoplastic chemotherapy: Secondary | ICD-10-CM

## 2021-02-25 DIAGNOSIS — Z87891 Personal history of nicotine dependence: Secondary | ICD-10-CM | POA: Diagnosis not present

## 2021-02-25 DIAGNOSIS — T451X5A Adverse effect of antineoplastic and immunosuppressive drugs, initial encounter: Secondary | ICD-10-CM | POA: Diagnosis not present

## 2021-02-25 DIAGNOSIS — Z79899 Other long term (current) drug therapy: Secondary | ICD-10-CM | POA: Diagnosis not present

## 2021-02-25 DIAGNOSIS — R748 Abnormal levels of other serum enzymes: Secondary | ICD-10-CM

## 2021-02-25 DIAGNOSIS — D6481 Anemia due to antineoplastic chemotherapy: Secondary | ICD-10-CM | POA: Diagnosis not present

## 2021-02-25 DIAGNOSIS — C251 Malignant neoplasm of body of pancreas: Secondary | ICD-10-CM | POA: Diagnosis present

## 2021-02-25 DIAGNOSIS — E119 Type 2 diabetes mellitus without complications: Secondary | ICD-10-CM | POA: Diagnosis not present

## 2021-02-25 DIAGNOSIS — Z7984 Long term (current) use of oral hypoglycemic drugs: Secondary | ICD-10-CM | POA: Diagnosis not present

## 2021-02-25 LAB — CBC WITH DIFFERENTIAL/PLATELET
Abs Immature Granulocytes: 0.08 K/uL — ABNORMAL HIGH (ref 0.00–0.07)
Basophils Absolute: 0 K/uL (ref 0.0–0.1)
Basophils Relative: 0 %
Eosinophils Absolute: 0.1 K/uL (ref 0.0–0.5)
Eosinophils Relative: 1 %
HCT: 28.9 % — ABNORMAL LOW (ref 36.0–46.0)
Hemoglobin: 9 g/dL — ABNORMAL LOW (ref 12.0–15.0)
Immature Granulocytes: 1 %
Lymphocytes Relative: 17 %
Lymphs Abs: 1.4 K/uL (ref 0.7–4.0)
MCH: 27.9 pg (ref 26.0–34.0)
MCHC: 31.1 g/dL (ref 30.0–36.0)
MCV: 89.5 fL (ref 80.0–100.0)
Monocytes Absolute: 0.7 K/uL (ref 0.1–1.0)
Monocytes Relative: 8 %
Neutro Abs: 6.1 K/uL (ref 1.7–7.7)
Neutrophils Relative %: 73 %
Platelets: 395 K/uL (ref 150–400)
RBC: 3.23 MIL/uL — ABNORMAL LOW (ref 3.87–5.11)
RDW: 16.6 % — ABNORMAL HIGH (ref 11.5–15.5)
WBC: 8.4 K/uL (ref 4.0–10.5)
nRBC: 0 % (ref 0.0–0.2)

## 2021-02-25 LAB — COMPREHENSIVE METABOLIC PANEL WITH GFR
ALT: 18 U/L (ref 0–44)
AST: 20 U/L (ref 15–41)
Albumin: 3.7 g/dL (ref 3.5–5.0)
Alkaline Phosphatase: 91 U/L (ref 38–126)
Anion gap: 14 (ref 5–15)
BUN: 35 mg/dL — ABNORMAL HIGH (ref 8–23)
CO2: 19 mmol/L — ABNORMAL LOW (ref 22–32)
Calcium: 9.3 mg/dL (ref 8.9–10.3)
Chloride: 102 mmol/L (ref 98–111)
Creatinine, Ser: 1.36 mg/dL — ABNORMAL HIGH (ref 0.44–1.00)
GFR, Estimated: 40 mL/min — ABNORMAL LOW
Glucose, Bld: 303 mg/dL — ABNORMAL HIGH (ref 70–99)
Potassium: 4.4 mmol/L (ref 3.5–5.1)
Sodium: 135 mmol/L (ref 135–145)
Total Bilirubin: 0.5 mg/dL (ref 0.3–1.2)
Total Protein: 6.9 g/dL (ref 6.5–8.1)

## 2021-02-25 MED ORDER — SODIUM CHLORIDE 0.9 % IV SOLN
Freq: Once | INTRAVENOUS | Status: AC
Start: 2021-02-25 — End: 2021-02-25
  Filled 2021-02-25: qty 250

## 2021-02-25 MED ORDER — PROCHLORPERAZINE MALEATE 10 MG PO TABS
10.0000 mg | ORAL_TABLET | Freq: Once | ORAL | Status: AC
  Administered 2021-02-25: 10 mg via ORAL
  Filled 2021-02-25: qty 1

## 2021-02-25 MED ORDER — HEPARIN SOD (PORK) LOCK FLUSH 100 UNIT/ML IV SOLN
INTRAVENOUS | Status: AC
  Filled 2021-02-25: qty 5

## 2021-02-25 MED ORDER — SODIUM CHLORIDE 0.9 % IV SOLN
Freq: Once | INTRAVENOUS | Status: AC
  Filled 2021-02-25: qty 250

## 2021-02-25 MED ORDER — HEPARIN SOD (PORK) LOCK FLUSH 100 UNIT/ML IV SOLN
500.0000 [IU] | Freq: Once | INTRAVENOUS | Status: AC | PRN
  Administered 2021-02-25: 500 [IU]
  Filled 2021-02-25: qty 5

## 2021-02-25 MED ORDER — SODIUM CHLORIDE 0.9 % IV SOLN
1400.0000 mg | Freq: Once | INTRAVENOUS | Status: AC
  Administered 2021-02-25: 1400 mg via INTRAVENOUS
  Filled 2021-02-25: qty 26.3

## 2021-02-25 NOTE — Patient Instructions (Signed)
[facility]  Discharge Instructions: Thank you for choosing [facility] to provide your oncology and hematology care.  If you have a lab appointment with the [facility], please go directly to the [facility] and check in at the registration area.  Wear comfortable clothing and clothing appropriate for easy access to any Portacath or PICC line.   We strive to give you quality time with your provider. You may need to reschedule your appointment if you arrive late (15 or more minutes).  Arriving late affects you and other patients whose appointments are after yours.  Also, if you miss three or more appointments without notifying the office, you may be dismissed from the clinic at the provider's discretion.      For prescription refill requests, have your pharmacy contact our office and allow 72 hours for refills to be completed.    Today you received the following chemotherapy and/or immunotherapy agents : Gemzar, hydration   To help prevent nausea and vomiting after your treatment, we encourage you to take your nausea medication as directed.  BELOW ARE SYMPTOMS THAT SHOULD BE REPORTED IMMEDIATELY: . *FEVER GREATER THAN 100.4 F (38 C) OR HIGHER . *CHILLS OR SWEATING . *NAUSEA AND VOMITING THAT IS NOT CONTROLLED WITH YOUR NAUSEA MEDICATION . *UNUSUAL SHORTNESS OF BREATH . *UNUSUAL BRUISING OR BLEEDING . *URINARY PROBLEMS (pain or burning when urinating, or frequent urination) . *BOWEL PROBLEMS (unusual diarrhea, constipation, pain near the anus) . TENDERNESS IN MOUTH AND THROAT WITH OR WITHOUT PRESENCE OF ULCERS (sore throat, sores in mouth, or a toothache) . UNUSUAL RASH, SWELLING OR PAIN  . UNUSUAL VAGINAL DISCHARGE OR ITCHING   Items with * indicate a potential emergency and should be followed up as soon as possible or go to the Emergency Department if any problems should occur.  Please show the CHEMOTHERAPY ALERT CARD or  IMMUNOTHERAPY ALERT CARD at check-in to the Emergency Department and triage nurse.  Should you have questions after your visit or need to cancel or reschedule your appointment, please contact [facility]  [phone] and follow the prompts.  Office hours are 8:00 a.m. to 4:30 p.m. Monday - Friday. Please note that voicemails left after 4:00 p.m. may not be returned until the following business day.  We are closed weekends and major holidays. You have access to a nurse at all times for urgent questions. Please call the main number to the clinic [phone] and follow the prompts.  For any non-urgent questions, you may also contact your provider using MyChart. We now offer e-Visits for anyone 42 and older to request care online for non-urgent symptoms. For details visit [email or web address].   Also download the MyChart app! Go to the app store, search "MyChart", open the app, select [facility], and log in with your MyChart username and password.  Due to Covid, a mask is required upon entering the hospital/clinic. If you do not have a mask, one will be given to you upon arrival. For doctor visits, patients may have 1 support person aged 33 or older with them. For treatment visits, patients cannot have anyone with them due to current Covid guidelines and our immunocompromised population.

## 2021-02-25 NOTE — Progress Notes (Signed)
Pt in for follow up, reports drove herself to appt today. States she is doing well and denies any concerns today.

## 2021-02-26 NOTE — Progress Notes (Incomplete)
Hematology/Oncology Consult note [facility]  Telephone:([phone] [phone]  Patient Care Team: Dion Body, MD as PCP - General (Family Medicine) Clent Jacks, RN as Oncology Nurse Navigator   Name of the patient: Rhonda Day  [id number]  1941-04-28   Date of visit: 02/26/21  Diagnosis- ***  Chief complaint/ Reason for visit- ***  Heme/Onc history: ***  Interval history- ***  ECOG PS- *** Pain scale- *** Opioid associated constipation- ***  Review of systems- ROS   Current treatment- ***  Allergies  Allergen Reactions  . Sulfa Antibiotics Rash    Other reaction(s): RASH Other reaction(s): RASH      Past Medical History:  Diagnosis Date  . Bladder infection   . Diabetes mellitus without complication ([place])   . Family history of kidney cancer   . Family history of lung cancer   . Family history of stomach cancer   . Family history of throat cancer   . Hypertension   . Pancreatic cancer [facility])    with liver mass     Past Surgical History:  Procedure Laterality Date  . BACK SURGERY    . PORTA CATH INSERTION N/A 12/27/2020   Procedure: PORTA CATH INSERTION;  Surgeon: Algernon Huxley, MD;  Location: [facility];  Service: Cardiovascular;  Laterality: N/A;  . TUBAL LIGATION      Social History   Socioeconomic History  . Marital status: Married    Spouse name: Not on file  . Number of children: Not on file  . Years of education: Not on file  . Highest education level: Not on file  Occupational History  . Not on file  Tobacco Use  . Smoking status: Former Smoker    Quit date: 11/20/1979    Years since quitting: 41.2  . Smokeless tobacco: Never Used  Vaping Use  . Vaping Use: Never used  Substance and Sexual Activity  . Alcohol use: No    Alcohol/week: 0.0 standard drinks  . Drug use: No  . Sexual activity: Never  Other Topics Concern  . Not on file  Social History Narrative  . Not on  file   Social Determinants of Health   Financial Resource Strain: Not on file  Food Insecurity: Not on file  Transportation Needs: Not on file  Physical Activity: Not on file  Stress: Not on file  Social Connections: Not on file  Intimate Partner Violence: Not on file    Family History  Problem Relation Age of Onset  . Kidney cancer Father 36  . COPD Mother   . Stomach cancer Maternal Aunt   . Throat cancer Maternal Uncle   . Liver cancer Paternal Uncle   . Esophageal cancer Paternal Grandfather   . Lung cancer Maternal Aunt   . Lung cancer Maternal Uncle   . Lung cancer Cousin   . Kidney cancer Cousin 37  . Testicular cancer Nephew        dx 70s  . Breast cancer Neg Hx      Current Outpatient Medications:  .  azelastine (ASTELIN) 0.1 % nasal spray, Place into both nostrils daily as needed., Disp: , Rfl:  .  azelastine (OPTIVAR) 0.05 % ophthalmic solution, Place into both eyes daily as needed., Disp: , Rfl: 3 .  diltiazem (TIAZAC) 180 MG 24 hr capsule, Take by mouth daily., Disp: , Rfl:  .  glimepiride (AMARYL) 2 MG tablet, Take 2 mg by mouth daily with breakfast., Disp: ,  Rfl:  .  HYDROcodone-acetaminophen (NORCO) 5-325 MG tablet, Take 1 tablet by mouth every 6 (six) hours as needed for moderate pain., Disp: 60 tablet, Rfl: 0 .  LORazepam (ATIVAN) 0.5 MG tablet, Take 1 tablet (0.5 mg total) by mouth every 8 (eight) hours., Disp: 60 tablet, Rfl: 0 .  losartan (COZAAR) 100 MG tablet, Take 100 mg by mouth daily., Disp: , Rfl:  .  metFORMIN (GLUCOPHAGE-XR) 500 MG 24 hr tablet, take 1 tablet by mouth once daily with DINNER, Disp: , Rfl: 0 .  morphine (MS CONTIN) 15 MG 12 hr tablet, Take 1 tablet (15 mg total) by mouth every 12 (twelve) hours., Disp: 60 tablet, Rfl: 0 .  ONE TOUCH ULTRA TEST test strip, , Disp: , Rfl: 1 .  ONETOUCH DELICA LANCETS 70J MISC, 1 each by XX route as directed. Check CBG's fasting once daily. Dx: E11.9, Disp: , Rfl:  .  traZODone (DESYREL) 50 MG  tablet, Take 0.5-1 tablets (25-50 mg total) by mouth at bedtime as needed for sleep., Disp: 30 tablet, Rfl: 0 .  ferrous sulfate 325 (65 FE) MG EC tablet, Take by mouth. (Patient not taking: No sig reported), Disp: , Rfl:  .  gabapentin (NEURONTIN) 100 MG capsule, Take 1 capsule (100 mg total) by mouth at bedtime. (Patient not taking: Reported on 02/25/2021), Disp: 30 capsule, Rfl: 2  Physical exam:  Vitals:   02/25/21 1328  BP: (!) 115/59  Pulse: 87  Resp: 18  Temp: 97.6 F (36.4 C)  TempSrc: Tympanic  SpO2: 97%  Weight: 153 lb (69.4 kg)   Physical Exam   CMP Latest Ref Rng & Units 02/25/2021  Glucose 70 - 99 mg/dL 303(H)  BUN 8 - 23 mg/dL 35(H)  Creatinine 0.44 - 1.00 mg/dL 1.36(H)  Sodium 135 - 145 mmol/L 135  Potassium 3.5 - 5.1 mmol/L 4.4  Chloride 98 - 111 mmol/L 102  CO2 22 - 32 mmol/L 19(L)  Calcium 8.9 - 10.3 mg/dL 9.3  Total Protein 6.5 - 8.1 g/dL 6.9  Total Bilirubin 0.3 - 1.2 mg/dL 0.5  Alkaline Phos 38 - 126 U/L 91  AST 15 - 41 U/L 20  ALT 0 - 44 U/L 18   CBC Latest Ref Rng & Units 02/25/2021  WBC 4.0 - 10.5 K/uL 8.4  Hemoglobin 12.0 - 15.0 g/dL 9.0(L)  Hematocrit 36.0 - 46.0 % 28.9(L)  Platelets 150 - 400 K/uL 395    No images are attached to the encounter.  No results found.   Assessment and plan- Patient is a 80 y.o. female ***   Visit Diagnosis No diagnosis found.   Dr. Randa Evens, MD, MPH [facility] at [facility] [phone] 02/26/2021 1:29 PM

## 2021-03-01 NOTE — Progress Notes (Signed)
Hematology/Oncology Consult note [facility]  Telephone:([phone] [phone]  Patient Care Team: Dion Body, MD as PCP - General (Family Medicine) Clent Jacks, RN as Oncology Nurse Navigator   Name of the patient: Rhonda Day  [id number]  May 21, 1941   Date of visit: 03/01/21  Diagnosis- metastatic pancreatic cancer with liver metastases  Chief complaint/ Reason for visit-on treatment assessment prior to cycle 2-day 1 of gemcitabine chemotherapy  Heme/Onc history: Patient is a 80 year old female who underwent ultrasound abdomen in December 2021 which showed hypoechoic mass in the pancreatic tail measuring 2.1 x 1.8 x 4.5 cm which was not seen on prior CT scans on MRI exams a year ago. This was followed by a CT abdomen and pelvis with contrast which showed ill-defined hypervascular lesion in segment 8 of liver measuring 1.6 x 1.4 cm concerning for metastases. 2 other lesions 1.7 x 1 cm and 1.5 x 0.8 cm were also noted. Large mass in the body of pancreas measuring 5.6 x 2.2 x 2.9 cm. The lesion causes obstruction of the pancreatic duct resulting in ductal dilatation throughout the tail of the pancreas. 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 Splenic vein appears chronically occluded. Lesion also comes in contact with splenic artery which remains patent. This was followed by MRI abdomen which again showed similar findings. Multiple foci of restricted diffusion with hyperenhancement noted in the right hepatic lobe. Prominent portocaval lymph node seen for local nodal metastases along with a 5.3 cm body and tail pancreatic mass  Case was discussed at tumor board and lab was liver biopsy 2 weeks after patient initial Covid test that was positive. However at the day of the biopsy this lesion was in close association with other  vascular structures and given the potential higher risks liver biopsy was aborted and EUS was recommended. She is currently on Cardizem.  Patient had a EUS at [facility] Dr. Mont Dutton. Pathology was consistent with adenocarcinoma. Patient had a PET scan on 12/11/2020 which showed a hypermetabolic lesion in the medial aspect of the liver with an SUV of 4.2 there is a subtle hypodensity on the comparison CT measuring 9 mm at this level. The hypermetabolic lesion in the dome of the liver corresponds to the enhancing lesion on the comparison MRI. 3.4 x 2.7 cm lesion in the mid body of pancreas with an SUV of 5.1. No hypermetabolic peripancreatic lymph nodes.  Patient received 1 cycle of modified FOLFIRINOX chemotherapy on 12/30/2020. Following that patient had significant nausea vomiting fatigue and declining performance status requiring chemotherapy to be put on hold.  Patient switched to single agent gemcitabine chemotherapy 2 weeks on 1 week off  Interval history-patient reports some ongoing fatigue but is overall able to function independently.  Reports no significant side effects from present chemotherapy.  ECOG PS- 1 Pain scale- 0   Review of systems- Review of Systems  Constitutional: Positive for malaise/fatigue. Negative for chills, fever and weight loss.  HENT: Negative for congestion, ear discharge and nosebleeds.   Eyes: Negative for blurred vision.  Respiratory: Negative for cough, hemoptysis, sputum production, shortness of breath and wheezing.   Cardiovascular: Negative for chest pain, palpitations, orthopnea and claudication.  Gastrointestinal: Negative for abdominal pain, blood in stool, constipation, diarrhea, heartburn, melena, nausea and vomiting.  Genitourinary: Negative for dysuria, flank pain, frequency, hematuria and urgency.  Musculoskeletal: Negative for back pain, joint  pain and myalgias.  Skin: Negative for rash.  Neurological: Negative for dizziness, tingling, focal  weakness, seizures, weakness and headaches.  Endo/Heme/Allergies: Does not bruise/bleed easily.  Psychiatric/Behavioral: Negative for depression and suicidal ideas. The patient does not have insomnia.      Allergies  Allergen Reactions  . Sulfa Antibiotics Rash    Other reaction(s): RASH Other reaction(s): RASH      Past Medical History:  Diagnosis Date  . Bladder infection   . Diabetes mellitus without complication ([place])   . Family history of kidney cancer   . Family history of lung cancer   . Family history of stomach cancer   . Family history of throat cancer   . Hypertension   . Pancreatic cancer [facility])    with liver mass     Past Surgical History:  Procedure Laterality Date  . BACK SURGERY    . PORTA CATH INSERTION N/A 12/27/2020   Procedure: PORTA CATH INSERTION;  Surgeon: Algernon Huxley, MD;  Location: [facility];  Service: Cardiovascular;  Laterality: N/A;  . TUBAL LIGATION      Social History   Socioeconomic History  . Marital status: Married    Spouse name: Not on file  . Number of children: Not on file  . Years of education: Not on file  . Highest education level: Not on file  Occupational History  . Not on file  Tobacco Use  . Smoking status: Former Smoker    Quit date: 11/20/1979    Years since quitting: 41.3  . Smokeless tobacco: Never Used  Vaping Use  . Vaping Use: Never used  Substance and Sexual Activity  . Alcohol use: No    Alcohol/week: 0.0 standard drinks  . Drug use: No  . Sexual activity: Never  Other Topics Concern  . Not on file  Social History Narrative  . Not on file   Social Determinants of Health   Financial Resource Strain: Not on file  Food Insecurity: Not on file  Transportation Needs: Not on file  Physical Activity: Not on file  Stress: Not on file  Social Connections: Not on file  Intimate Partner Violence: Not on file    Family History  Problem Relation Age of Onset  . Kidney cancer Father 71  . COPD  Mother   . Stomach cancer Maternal Aunt   . Throat cancer Maternal Uncle   . Liver cancer Paternal Uncle   . Esophageal cancer Paternal Grandfather   . Lung cancer Maternal Aunt   . Lung cancer Maternal Uncle   . Lung cancer Cousin   . Kidney cancer Cousin 68  . Testicular cancer Nephew        dx 81s  . Breast cancer Neg Hx      Current Outpatient Medications:  .  azelastine (ASTELIN) 0.1 % nasal spray, Place into both nostrils daily as needed., Disp: , Rfl:  .  azelastine (OPTIVAR) 0.05 % ophthalmic solution, Place into both eyes daily as needed., Disp: , Rfl: 3 .  diltiazem (TIAZAC) 180 MG 24 hr capsule, Take by mouth daily., Disp: , Rfl:  .  glimepiride (AMARYL) 2 MG tablet, Take 2 mg by mouth daily with breakfast., Disp: , Rfl:  .  HYDROcodone-acetaminophen (NORCO) 5-325 MG tablet, Take 1 tablet by mouth every 6 (six) hours as needed for moderate pain., Disp: 60 tablet, Rfl: 0 .  LORazepam (ATIVAN) 0.5 MG tablet, Take 1 tablet (0.5 mg total) by mouth every 8 (eight) hours.,  Disp: 60 tablet, Rfl: 0 .  losartan (COZAAR) 100 MG tablet, Take 100 mg by mouth daily., Disp: , Rfl:  .  metFORMIN (GLUCOPHAGE-XR) 500 MG 24 hr tablet, take 1 tablet by mouth once daily with DINNER, Disp: , Rfl: 0 .  morphine (MS CONTIN) 15 MG 12 hr tablet, Take 1 tablet (15 mg total) by mouth every 12 (twelve) hours., Disp: 60 tablet, Rfl: 0 .  ONE TOUCH ULTRA TEST test strip, , Disp: , Rfl: 1 .  ONETOUCH DELICA LANCETS 16X MISC, 1 each by XX route as directed. Check CBG's fasting once daily. Dx: E11.9, Disp: , Rfl:  .  traZODone (DESYREL) 50 MG tablet, Take 0.5-1 tablets (25-50 mg total) by mouth at bedtime as needed for sleep., Disp: 30 tablet, Rfl: 0 .  ferrous sulfate 325 (65 FE) MG EC tablet, Take by mouth. (Patient not taking: No sig reported), Disp: , Rfl:  .  gabapentin (NEURONTIN) 100 MG capsule, Take 1 capsule (100 mg total) by mouth at bedtime. (Patient not taking: Reported on 02/25/2021), Disp: 30  capsule, Rfl: 2  Physical exam:  Vitals:   02/25/21 1328  BP: (!) 115/59  Pulse: 87  Resp: 18  Temp: 97.6 F (36.4 C)  TempSrc: Tympanic  SpO2: 97%  Weight: 153 lb (69.4 kg)   Physical Exam Constitutional:      General: She is not in acute distress. Cardiovascular:     Rate and Rhythm: Normal rate and regular rhythm.     Heart sounds: Normal heart sounds.  Pulmonary:     Effort: Pulmonary effort is normal.     Breath sounds: Normal breath sounds.  Abdominal:     General: Bowel sounds are normal.     Palpations: Abdomen is soft.  Skin:    General: Skin is warm and dry.  Neurological:     Mental Status: She is alert and oriented to person, place, and time.      CMP Latest Ref Rng & Units 02/25/2021  Glucose 70 - 99 mg/dL 303(H)  BUN 8 - 23 mg/dL 35(H)  Creatinine 0.44 - 1.00 mg/dL 1.36(H)  Sodium 135 - 145 mmol/L 135  Potassium 3.5 - 5.1 mmol/L 4.4  Chloride 98 - 111 mmol/L 102  CO2 22 - 32 mmol/L 19(L)  Calcium 8.9 - 10.3 mg/dL 9.3  Total Protein 6.5 - 8.1 g/dL 6.9  Total Bilirubin 0.3 - 1.2 mg/dL 0.5  Alkaline Phos 38 - 126 U/L 91  AST 15 - 41 U/L 20  ALT 0 - 44 U/L 18   CBC Latest Ref Rng & Units 02/25/2021  WBC 4.0 - 10.5 K/uL 8.4  Hemoglobin 12.0 - 15.0 g/dL 9.0(L)  Hematocrit 36.0 - 46.0 % 28.9(L)  Platelets 150 - 400 K/uL 395     Assessment and plan- Patient is a 80 y.o. 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  She is here for on treatment assessment prior to cycle 2-day 1 of gemcitabine chemotherapy  Counts okay to proceed with cycle 2-day 1 of gemcitabine chemotherapy today.  She will directly proceed for cycle 2-day 8 next week.   Port labs and cycle 3-day 1 of gemcitabine chemotherapy in 3 weeks and will be seen by covering MD/NP.  I will see her back in 6 weeks.  Plan to repeat scans after 4 cycles  Chemo induced anemia: Stable continue to monitor  She will receive 1 L of IV fluids today as well as next week.   Possible IV  fluids with each treatment   Visit Diagnosis 1. Encounter for antineoplastic chemotherapy   2. Pancreatic cancer metastasized to liver ([place])   3. Antineoplastic chemotherapy induced anemia      Dr. Randa Evens, MD, MPH [facility] [id number] 03/01/2021 9:54 AM

## 2021-03-03 ENCOUNTER — Other Ambulatory Visit: Payer: Self-pay | Admitting: Oncology

## 2021-03-04 ENCOUNTER — Inpatient Hospital Stay: Payer: Medicare PPO

## 2021-03-04 ENCOUNTER — Inpatient Hospital Stay: Payer: Medicare PPO | Admitting: Hospice and Palliative Medicine

## 2021-03-04 ENCOUNTER — Other Ambulatory Visit: Payer: Self-pay

## 2021-03-04 VITALS — BP 135/61 | HR 84 | Temp 97.1°F | Resp 18 | Wt 152.0 lb

## 2021-03-04 DIAGNOSIS — C787 Secondary malignant neoplasm of liver and intrahepatic bile duct: Secondary | ICD-10-CM

## 2021-03-04 DIAGNOSIS — C259 Malignant neoplasm of pancreas, unspecified: Secondary | ICD-10-CM

## 2021-03-04 DIAGNOSIS — Z5111 Encounter for antineoplastic chemotherapy: Secondary | ICD-10-CM | POA: Diagnosis not present

## 2021-03-04 DIAGNOSIS — R7989 Other specified abnormal findings of blood chemistry: Secondary | ICD-10-CM

## 2021-03-04 LAB — CBC WITH DIFFERENTIAL/PLATELET
Abs Immature Granulocytes: 0.1 10*3/uL — ABNORMAL HIGH (ref 0.00–0.07)
Basophils Absolute: 0 10*3/uL (ref 0.0–0.1)
Basophils Relative: 0 %
Eosinophils Absolute: 0 10*3/uL (ref 0.0–0.5)
Eosinophils Relative: 0 %
HCT: 29.4 % — ABNORMAL LOW (ref 36.0–46.0)
Hemoglobin: 9.2 g/dL — ABNORMAL LOW (ref 12.0–15.0)
Immature Granulocytes: 1 %
Lymphocytes Relative: 26 %
Lymphs Abs: 2 10*3/uL (ref 0.7–4.0)
MCH: 28.2 pg (ref 26.0–34.0)
MCHC: 31.3 g/dL (ref 30.0–36.0)
MCV: 90.2 fL (ref 80.0–100.0)
Monocytes Absolute: 0.7 10*3/uL (ref 0.1–1.0)
Monocytes Relative: 9 %
Neutro Abs: 4.9 10*3/uL (ref 1.7–7.7)
Neutrophils Relative %: 64 %
Platelets: 472 10*3/uL — ABNORMAL HIGH (ref 150–400)
RBC: 3.26 MIL/uL — ABNORMAL LOW (ref 3.87–5.11)
RDW: 15.9 % — ABNORMAL HIGH (ref 11.5–15.5)
WBC: 7.8 10*3/uL (ref 4.0–10.5)
nRBC: 0 % (ref 0.0–0.2)

## 2021-03-04 LAB — COMPREHENSIVE METABOLIC PANEL
ALT: 38 U/L (ref 0–44)
AST: 28 U/L (ref 15–41)
Albumin: 4 g/dL (ref 3.5–5.0)
Alkaline Phosphatase: 86 U/L (ref 38–126)
Anion gap: 14 (ref 5–15)
BUN: 35 mg/dL — ABNORMAL HIGH (ref 8–23)
CO2: 22 mmol/L (ref 22–32)
Calcium: 9.7 mg/dL (ref 8.9–10.3)
Chloride: 102 mmol/L (ref 98–111)
Creatinine, Ser: 1.25 mg/dL — ABNORMAL HIGH (ref 0.44–1.00)
GFR, Estimated: 44 mL/min — ABNORMAL LOW (ref >60)
Glucose, Bld: 208 mg/dL — ABNORMAL HIGH (ref 70–99)
Potassium: 3.8 mmol/L (ref 3.5–5.1)
Sodium: 138 mmol/L (ref 135–145)
Total Bilirubin: 0.6 mg/dL (ref 0.3–1.2)
Total Protein: 7.5 g/dL (ref 6.5–8.1)

## 2021-03-04 MED ORDER — HEPARIN SOD (PORK) LOCK FLUSH 100 UNIT/ML IV SOLN
INTRAVENOUS | Status: AC
  Filled 2021-03-04: qty 5

## 2021-03-04 MED ORDER — SODIUM CHLORIDE 0.9 % IV SOLN
1400.0000 mg | Freq: Once | INTRAVENOUS | Status: AC
  Administered 2021-03-04: 1400 mg via INTRAVENOUS
  Filled 2021-03-04: qty 26.3

## 2021-03-04 MED ORDER — PROCHLORPERAZINE MALEATE 10 MG PO TABS
10.0000 mg | ORAL_TABLET | Freq: Once | ORAL | Status: AC
  Administered 2021-03-04: 10 mg via ORAL
  Filled 2021-03-04: qty 1

## 2021-03-04 MED ORDER — HEPARIN SOD (PORK) LOCK FLUSH 100 UNIT/ML IV SOLN
500.0000 [IU] | Freq: Once | INTRAVENOUS | Status: AC | PRN
  Administered 2021-03-04: 500 [IU]
  Filled 2021-03-04: qty 5

## 2021-03-04 MED ORDER — HEPARIN SOD (PORK) LOCK FLUSH 100 UNIT/ML IV SOLN
500.0000 [IU] | Freq: Once | INTRAVENOUS | Status: AC
Start: 2021-03-04 — End: 2021-03-04
  Administered 2021-03-04: 500 [IU] via INTRAVENOUS
  Filled 2021-03-04: qty 5

## 2021-03-04 MED ORDER — SODIUM CHLORIDE 0.9 % IV SOLN
Freq: Once | INTRAVENOUS | Status: AC
  Filled 2021-03-04: qty 250

## 2021-03-04 MED ORDER — SODIUM CHLORIDE 0.9% FLUSH
10.0000 mL | INTRAVENOUS | Status: AC | PRN
  Administered 2021-03-04: 10 mL via INTRAVENOUS
  Filled 2021-03-04: qty 10

## 2021-03-04 MED ORDER — SODIUM CHLORIDE 0.9 % IV SOLN
Freq: Once | INTRAVENOUS | Status: AC
Start: 2021-03-04 — End: 2021-03-04
  Filled 2021-03-04: qty 250

## 2021-03-04 NOTE — Patient Instructions (Signed)
[facility]  Discharge Instructions: Thank you for choosing [facility] to provide your oncology and hematology care.  If you have a lab appointment with the Cancer Center, please go directly to the Cancer Center and check in at the registration area.  Wear comfortable clothing and clothing appropriate for easy access to any Portacath or PICC line.   We strive to give you quality time with your provider. You may need to reschedule your appointment if you arrive late (15 or more minutes).  Arriving late affects you and other patients whose appointments are after yours.  Also, if you miss three or more appointments without notifying the office, you may be dismissed from the clinic at the provider's discretion.      For prescription refill requests, have your pharmacy contact our office and allow 72 hours for refills to be completed.    Today you received the following chemotherapy and/or immunotherapy agents Gemzar   To help prevent nausea and vomiting after your treatment, we encourage you to take your nausea medication as directed.  BELOW ARE SYMPTOMS THAT SHOULD BE REPORTED IMMEDIATELY: *FEVER GREATER THAN 100.4 F (38 C) OR HIGHER *CHILLS OR SWEATING *NAUSEA AND VOMITING THAT IS NOT CONTROLLED WITH YOUR NAUSEA MEDICATION *UNUSUAL SHORTNESS OF BREATH *UNUSUAL BRUISING OR BLEEDING *URINARY PROBLEMS (pain or burning when urinating, or frequent urination) *BOWEL PROBLEMS (unusual diarrhea, constipation, pain near the anus) TENDERNESS IN MOUTH AND THROAT WITH OR WITHOUT PRESENCE OF ULCERS (sore throat, sores in mouth, or a toothache) UNUSUAL RASH, SWELLING OR PAIN  UNUSUAL VAGINAL DISCHARGE OR ITCHING   Items with * indicate a potential emergency and should be followed up as soon as possible or go to the Emergency Department if any problems should occur.  Please show the CHEMOTHERAPY ALERT CARD or IMMUNOTHERAPY ALERT CARD at check-in to the  Emergency Department and triage nurse.  Should you have questions after your visit or need to cancel or reschedule your appointment, please contact [facility] C[facility]  [phone] and follow the prompts.  Office hours are 8:00 a.m. to 4:30 p.m. Monday - Friday. Please note that voicemails left after 4:00 p.m. may not be returned until the following business day.  We are closed weekends and major holidays. You have access to a nurse at all times for urgent questions. Please call the main number to the clinic [phone] and follow the prompts.  For any non-urgent questions, you may also contact your provider using MyChart. We now offer e-Visits for anyone 18 and older to request care online for non-urgent symptoms. For details visit [email or web address].   Also download the MyChart app! Go to the app store, search "MyChart", open the app, select [facility], and log in with your MyChart username and password.  Due to Covid, a mask is required upon entering the hospital/clinic. If you do not have a mask, one will be given to you upon arrival. For doctor visits, patients may have 1 support person aged 18 or older with them. For treatment visits, patients cannot have anyone with them due to current Covid guidelines and our immunocompromised population.  

## 2021-03-05 ENCOUNTER — Telehealth: Payer: Self-pay | Admitting: *Deleted

## 2021-03-05 ENCOUNTER — Telehealth: Payer: Self-pay | Admitting: Hospice and Palliative Medicine

## 2021-03-05 ENCOUNTER — Other Ambulatory Visit: Payer: Self-pay | Admitting: Oncology

## 2021-03-05 DIAGNOSIS — C259 Malignant neoplasm of pancreas, unspecified: Secondary | ICD-10-CM

## 2021-03-05 DIAGNOSIS — C787 Secondary malignant neoplasm of liver and intrahepatic bile duct: Secondary | ICD-10-CM

## 2021-03-05 LAB — CANCER ANTIGEN 19-9: CA 19-9: 1476 U/mL — ABNORMAL HIGH (ref 0–35)

## 2021-03-05 NOTE — Progress Notes (Signed)
pet

## 2021-03-05 NOTE — Telephone Encounter (Signed)
Patient requested a call regarding rising CA 19-9. I spoke with her by phone. She reports doing reasonably well without significant changes. We discussed labs and I spoke with Dr. Janese Banks who recommended PET scan prior to patient being seen back on 6/21.

## 2021-03-05 NOTE — Telephone Encounter (Signed)
Patient called requesting to speak with Sharion Dove, NP regarding her tumor markers being elevated and if her treatment plan needs to change

## 2021-03-13 ENCOUNTER — Encounter: Payer: Self-pay | Admitting: Oncology

## 2021-03-17 ENCOUNTER — Other Ambulatory Visit: Payer: Self-pay | Admitting: *Deleted

## 2021-03-17 DIAGNOSIS — C259 Malignant neoplasm of pancreas, unspecified: Secondary | ICD-10-CM

## 2021-03-17 DIAGNOSIS — C787 Secondary malignant neoplasm of liver and intrahepatic bile duct: Secondary | ICD-10-CM

## 2021-03-18 ENCOUNTER — Encounter: Payer: Self-pay | Admitting: Oncology

## 2021-03-18 ENCOUNTER — Inpatient Hospital Stay: Payer: Medicare PPO

## 2021-03-18 ENCOUNTER — Inpatient Hospital Stay: Payer: Medicare PPO | Admitting: Oncology

## 2021-03-18 ENCOUNTER — Inpatient Hospital Stay (HOSPITAL_BASED_OUTPATIENT_CLINIC_OR_DEPARTMENT_OTHER): Payer: Medicare PPO | Admitting: Hospice and Palliative Medicine

## 2021-03-18 ENCOUNTER — Other Ambulatory Visit: Payer: Self-pay

## 2021-03-18 VITALS — BP 115/69 | HR 87 | Temp 98.6°F | Resp 16 | Wt 153.3 lb

## 2021-03-18 DIAGNOSIS — C259 Malignant neoplasm of pancreas, unspecified: Secondary | ICD-10-CM

## 2021-03-18 DIAGNOSIS — Z5111 Encounter for antineoplastic chemotherapy: Secondary | ICD-10-CM

## 2021-03-18 DIAGNOSIS — T451X5A Adverse effect of antineoplastic and immunosuppressive drugs, initial encounter: Secondary | ICD-10-CM | POA: Diagnosis not present

## 2021-03-18 DIAGNOSIS — Z515 Encounter for palliative care: Secondary | ICD-10-CM

## 2021-03-18 DIAGNOSIS — C787 Secondary malignant neoplasm of liver and intrahepatic bile duct: Secondary | ICD-10-CM

## 2021-03-18 DIAGNOSIS — G893 Neoplasm related pain (acute) (chronic): Secondary | ICD-10-CM

## 2021-03-18 DIAGNOSIS — D6481 Anemia due to antineoplastic chemotherapy: Secondary | ICD-10-CM | POA: Diagnosis not present

## 2021-03-18 DIAGNOSIS — N1832 Chronic kidney disease, stage 3b: Secondary | ICD-10-CM

## 2021-03-18 LAB — COMPREHENSIVE METABOLIC PANEL
ALT: 20 U/L (ref 0–44)
AST: 23 U/L (ref 15–41)
Albumin: 4 g/dL (ref 3.5–5.0)
Alkaline Phosphatase: 87 U/L (ref 38–126)
Anion gap: 13 (ref 5–15)
BUN: 26 mg/dL — ABNORMAL HIGH (ref 8–23)
CO2: 20 mmol/L — ABNORMAL LOW (ref 22–32)
Calcium: 9.4 mg/dL (ref 8.9–10.3)
Chloride: 105 mmol/L (ref 98–111)
Creatinine, Ser: 1.32 mg/dL — ABNORMAL HIGH (ref 0.44–1.00)
GFR, Estimated: 42 mL/min — ABNORMAL LOW (ref >60)
Glucose, Bld: 191 mg/dL — ABNORMAL HIGH (ref 70–99)
Potassium: 4.1 mmol/L (ref 3.5–5.1)
Sodium: 138 mmol/L (ref 135–145)
Total Bilirubin: 0.5 mg/dL (ref 0.3–1.2)
Total Protein: 7.3 g/dL (ref 6.5–8.1)

## 2021-03-18 LAB — CBC WITH DIFFERENTIAL/PLATELET
Abs Immature Granulocytes: 0.05 10*3/uL (ref 0.00–0.07)
Basophils Absolute: 0 10*3/uL (ref 0.0–0.1)
Basophils Relative: 0 %
Eosinophils Absolute: 0.2 10*3/uL (ref 0.0–0.5)
Eosinophils Relative: 2 %
HCT: 30.5 % — ABNORMAL LOW (ref 36.0–46.0)
Hemoglobin: 9.6 g/dL — ABNORMAL LOW (ref 12.0–15.0)
Immature Granulocytes: 1 %
Lymphocytes Relative: 28 %
Lymphs Abs: 2.6 10*3/uL (ref 0.7–4.0)
MCH: 28.8 pg (ref 26.0–34.0)
MCHC: 31.5 g/dL (ref 30.0–36.0)
MCV: 91.6 fL (ref 80.0–100.0)
Monocytes Absolute: 1 10*3/uL (ref 0.1–1.0)
Monocytes Relative: 11 %
Neutro Abs: 5.3 10*3/uL (ref 1.7–7.7)
Neutrophils Relative %: 58 %
Platelets: 284 10*3/uL (ref 150–400)
RBC: 3.33 MIL/uL — ABNORMAL LOW (ref 3.87–5.11)
RDW: 17.4 % — ABNORMAL HIGH (ref 11.5–15.5)
WBC: 9.2 10*3/uL (ref 4.0–10.5)
nRBC: 0 % (ref 0.0–0.2)

## 2021-03-18 MED ORDER — SODIUM CHLORIDE 0.9 % IV SOLN
Freq: Once | INTRAVENOUS | Status: AC
Start: 2021-03-18 — End: 2021-03-18
  Filled 2021-03-18: qty 250

## 2021-03-18 MED ORDER — HEPARIN SOD (PORK) LOCK FLUSH 100 UNIT/ML IV SOLN
500.0000 [IU] | Freq: Once | INTRAVENOUS | Status: AC
  Administered 2021-03-18: 500 [IU] via INTRAVENOUS
  Filled 2021-03-18: qty 5

## 2021-03-18 MED ORDER — HEPARIN SOD (PORK) LOCK FLUSH 100 UNIT/ML IV SOLN
INTRAVENOUS | Status: AC
  Filled 2021-03-18: qty 5

## 2021-03-18 MED ORDER — SODIUM CHLORIDE 0.9 % IV SOLN
Freq: Once | INTRAVENOUS | Status: AC
  Filled 2021-03-18: qty 250

## 2021-03-18 MED ORDER — PROCHLORPERAZINE MALEATE 10 MG PO TABS
10.0000 mg | ORAL_TABLET | Freq: Once | ORAL | Status: AC
  Administered 2021-03-18: 10 mg via ORAL
  Filled 2021-03-18 (×2): qty 1

## 2021-03-18 MED ORDER — HYDROCODONE-ACETAMINOPHEN 5-325 MG PO TABS: 1.0000 | ORAL_TABLET | Freq: Four times a day (QID) | ORAL | 0 refills | Status: DC | PRN

## 2021-03-18 MED ORDER — SODIUM CHLORIDE 0.9% FLUSH
10.0000 mL | Freq: Once | INTRAVENOUS | Status: AC
  Administered 2021-03-18: 10 mL via INTRAVENOUS
  Filled 2021-03-18: qty 10

## 2021-03-18 MED ORDER — SODIUM CHLORIDE 0.9 % IV SOLN
1400.0000 mg | Freq: Once | INTRAVENOUS | Status: AC
  Administered 2021-03-18: 1400 mg via INTRAVENOUS
  Filled 2021-03-18: qty 26.3

## 2021-03-18 NOTE — Patient Instructions (Signed)
[facility]  Discharge Instructions: Thank you for choosing [facility] to provide your oncology and hematology care.  If you have a lab appointment with the [facility], please go directly to the [facility] and check in at the registration area.  Wear comfortable clothing and clothing appropriate for easy access to any Portacath or PICC line.   We strive to give you quality time with your provider. You may need to reschedule your appointment if you arrive late (15 or more minutes).  Arriving late affects you and other patients whose appointments are after yours.  Also, if you miss three or more appointments without notifying the office, you may be dismissed from the clinic at the provider's discretion.      For prescription refill requests, have your pharmacy contact our office and allow 72 hours for refills to be completed.    Today you received the following chemotherapy and/or immunotherapy agents Gemzar      To help prevent nausea and vomiting after your treatment, we encourage you to take your nausea medication as directed.  BELOW ARE SYMPTOMS THAT SHOULD BE REPORTED IMMEDIATELY: . *FEVER GREATER THAN 100.4 F (38 C) OR HIGHER . *CHILLS OR SWEATING . *NAUSEA AND VOMITING THAT IS NOT CONTROLLED WITH YOUR NAUSEA MEDICATION . *UNUSUAL SHORTNESS OF BREATH . *UNUSUAL BRUISING OR BLEEDING . *URINARY PROBLEMS (pain or burning when urinating, or frequent urination) . *BOWEL PROBLEMS (unusual diarrhea, constipation, pain near the anus) . TENDERNESS IN MOUTH AND THROAT WITH OR WITHOUT PRESENCE OF ULCERS (sore throat, sores in mouth, or a toothache) . UNUSUAL RASH, SWELLING OR PAIN  . UNUSUAL VAGINAL DISCHARGE OR ITCHING   Items with * indicate a potential emergency and should be followed up as soon as possible or go to the Emergency Department if any problems should occur.  Please show the CHEMOTHERAPY ALERT CARD or IMMUNOTHERAPY ALERT  CARD at check-in to the Emergency Department and triage nurse.  Should you have questions after your visit or need to cancel or reschedule your appointment, please contact [facility]  [phone] and follow the prompts.  Office hours are 8:00 a.m. to 4:30 p.m. Monday - Friday. Please note that voicemails left after 4:00 p.m. may not be returned until the following business day.  We are closed weekends and major holidays. You have access to a nurse at all times for urgent questions. Please call the main number to the clinic [phone] and follow the prompts.  For any non-urgent questions, you may also contact your provider using MyChart. We now offer e-Visits for anyone 63 and older to request care online for non-urgent symptoms. For details visit [email or web address].   Also download the MyChart app! Go to the app store, search "MyChart", open the app, select [facility], and log in with your MyChart username and password.  Due to Covid, a mask is required upon entering the hospital/clinic. If you do not have a mask, one will be given to you upon arrival. For doctor visits, patients may have 1 support person aged 15 or older with them. For treatment visits, patients cannot have anyone with them due to current Covid guidelines and our immunocompromised population. Gemcitabine injection What is this medicine? GEMCITABINE (jem SYE ta been) is a chemotherapy drug. This medicine is used to treat many types of cancer like breast cancer, lung cancer, pancreatic cancer, and ovarian cancer. This medicine may be used for other purposes; ask your health care  provider or pharmacist if you have questions. COMMON BRAND NAME(S): Gemzar, Infugem What should I tell my health care provider before I take this medicine? They need to know if you have any of these conditions:  blood disorders  infection  kidney disease  liver disease  lung or breathing disease, like  asthma  recent or ongoing radiation therapy  an unusual or allergic reaction to gemcitabine, other chemotherapy, other medicines, foods, dyes, or preservatives  pregnant or trying to get pregnant  breast-feeding How should I use this medicine? This drug is given as an infusion into a vein. It is administered in a hospital or clinic by a specially trained health care professional. Talk to your pediatrician regarding the use of this medicine in children. Special care may be needed. Overdosage: If you think you have taken too much of this medicine contact a poison control center or emergency room at once. NOTE: This medicine is only for you. Do not share this medicine with others. What if I miss a dose? It is important not to miss your dose. Call your doctor or health care professional if you are unable to keep an appointment. What may interact with this medicine?  medicines to increase blood counts like filgrastim, pegfilgrastim, sargramostim  some other chemotherapy drugs like cisplatin  vaccines Talk to your doctor or health care professional before taking any of these medicines:  acetaminophen  aspirin  ibuprofen  ketoprofen  naproxen This list may not describe all possible interactions. Give your health care provider a list of all the medicines, herbs, non-prescription drugs, or dietary supplements you use. Also tell them if you smoke, drink alcohol, or use illegal drugs. Some items may interact with your medicine. What should I watch for while using this medicine? Visit your doctor for checks on your progress. This drug may make you feel generally unwell. This is not uncommon, as chemotherapy can affect healthy cells as well as cancer cells. Report any side effects. Continue your course of treatment even though you feel ill unless your doctor tells you to stop. In some cases, you may be given additional medicines to help with side effects. Follow all directions for their  use. Call your doctor or health care professional for advice if you get a fever, chills or sore throat, or other symptoms of a cold or flu. Do not treat yourself. This drug decreases your body's ability to fight infections. Try to avoid being around people who are sick. This medicine may increase your risk to bruise or bleed. Call your doctor or health care professional if you notice any unusual bleeding. Be careful brushing and flossing your teeth or using a toothpick because you may get an infection or bleed more easily. If you have any dental work done, tell your dentist you are receiving this medicine. Avoid taking products that contain aspirin, acetaminophen, ibuprofen, naproxen, or ketoprofen unless instructed by your doctor. These medicines may hide a fever. Do not become pregnant while taking this medicine or for 6 months after stopping it. Women should inform their doctor if they wish to become pregnant or think they might be pregnant. Men should not father a child while taking this medicine and for 3 months after stopping it. There is a potential for serious side effects to an unborn child. Talk to your health care professional or pharmacist for more information. Do not breast-feed an infant while taking this medicine or for at least 1 week after stopping it. Men should inform their doctors  if they wish to father a child. This medicine may lower sperm counts. Talk with your doctor or health care professional if you are concerned about your fertility. What side effects may I notice from receiving this medicine? Side effects that you should report to your doctor or health care professional as soon as possible:  allergic reactions like skin rash, itching or hives, swelling of the face, lips, or tongue  breathing problems  pain, redness, or irritation at site where injected  signs and symptoms of a dangerous change in heartbeat or heart rhythm like chest pain; dizziness; fast or irregular  heartbeat; palpitations; feeling faint or lightheaded, falls; breathing problems  signs of decreased platelets or bleeding - bruising, pinpoint red spots on the skin, black, tarry stools, blood in the urine  signs of decreased red blood cells - unusually weak or tired, feeling faint or lightheaded, falls  signs of infection - fever or chills, cough, sore throat, pain or difficulty passing urine  signs and symptoms of kidney injury like trouble passing urine or change in the amount of urine  signs and symptoms of liver injury like dark yellow or brown urine; general ill feeling or flu-like symptoms; light-colored stools; loss of appetite; nausea; right upper belly pain; unusually weak or tired; yellowing of the eyes or skin  swelling of ankles, feet, hands Side effects that usually do not require medical attention (report to your doctor or health care professional if they continue or are bothersome):  constipation  diarrhea  hair loss  loss of appetite  nausea  rash  vomiting This list may not describe all possible side effects. Call your doctor for medical advice about side effects. You may report side effects to FDA at 1-800-FDA-1088. Where should I keep my medicine? This drug is given in a hospital or clinic and will not be stored at home. NOTE: This sheet is a summary. It may not cover all possible information. If you have questions about this medicine, talk to your doctor, pharmacist, or health care provider.  2021 Elsevier/Gold Standard (2017-12-29 18:06:11)

## 2021-03-18 NOTE — Progress Notes (Signed)
[place]  Telephone:([phone] [phone]   Name: Rhonda Day Date: 03/18/2021 MRN: [id number]  DOB: [birth year]  Patient Care Team: Dion Body, MD as PCP - General (Family Medicine) Clent Jacks, RN as Oncology Nurse Navigator    REASON FOR CONSULTATION: Rhonda Day is a 80 y.o. 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  Patient was found to have a pancreatic tail mass in December 2021.  She ultimately underwent EUS and biopsy with pathology consistent for adenocarcinoma.  Patient is referred to palliative care to help address goals and manage ongoing symptoms.   SOCIAL HISTORY:     reports that she quit smoking about 41 years ago. She has never used smokeless tobacco. She reports that she does not drink alcohol and does not use drugs.  Patient is married and lives at home with her husband.  She has no children.  ADVANCE DIRECTIVES:  None on file  CODE STATUS: DNR/DNI (DNR and MOST form on file)  PAST MEDICAL HISTORY: Past Medical History:  Diagnosis Date  . Bladder infection   . Diabetes mellitus without complication ([place])   . Family history of kidney cancer   . Family history of lung cancer   . Family history of stomach cancer   . Family history of throat cancer   . Hypertension   . Pancreatic cancer ([place])    with liver mass    PAST SURGICAL HISTORY:  Past Surgical History:  Procedure Laterality Date  . BACK SURGERY    . PORTA CATH INSERTION N/A 12/27/2020   Procedure: PORTA CATH INSERTION;  Surgeon: Algernon Huxley, MD;  Location: [facility];  Service: Cardiovascular;  Laterality: N/A;  . TUBAL LIGATION      HEMATOLOGY/ONCOLOGY HISTORY:  Oncology History  Pancreatic cancer metastasized to liver ([place])  12/13/2020 Initial Diagnosis   Pancreatic cancer metastasized to liver ([place])    12/13/2020 Cancer Staging   Staging form: Exocrine Pancreas, AJCC 8th Edition - Clinical stage from 12/13/2020: Stage IV (cT2, cN0, cM1) - Signed by Sindy Guadeloupe, MD on 12/13/2020 Total positive nodes: 0   12/30/2020 - 01/01/2021 Chemotherapy         02/04/2021 -  Chemotherapy    Patient is on Treatment Plan: PANCREAS GEMCITABINE H2,0,N47S X 6 CYCLES       Genetic Testing   Negative genetic testing. No pathogenic variants identified on the [facility] Multi-Cancer+RNA panel. VUS in [facility] called c.1978G>A and VUS in [facility] called c.983C>T were identified. The report date its 01/25/2021.  The Multi-Cancer Panel + RNA offered by [facility] includes sequencing and/or deletion duplication testing of the following 84 genes: AIP, ALK, APC, ATM, AXIN2,BAP1,  BARD1, BLM, BMPR1A, BRCA1, BRCA2, BRIP1, CASR, CDC73, CDH1, CDK4, CDKN1B, CDKN1C, CDKN2A (p14ARF), CDKN2A (p16INK4a), CEBPA, CHEK2, CTNNA1, DICER1, DIS3L2, EGFR (c.2369C>T, p.Thr790Met variant only), EPCAM (Deletion/duplication testing only), FH, FLCN, GATA2, GPC3, GREM1 (Promoter region deletion/duplication testing only), HOXB13 (c.251G>A, p.Gly84Glu), HRAS, KIT, MAX, MEN1, MET, MITF (c.952G>A, p.Glu318Lys variant only), MLH1, MSH2, MSH3, MSH6, MUTYH, NBN, NF1, NF2, NTHL1, PALB2, PDGFRA, PHOX2B, PMS2, POLD1, POLE, POT1, PRKAR1A, PTCH1, PTEN, RAD50, RAD51C, RAD51D, RB1, RECQL4, RET, RUNX1, SDHAF2, SDHA (sequence changes only), SDHB, SDHC, SDHD, SMAD4, SMARCA4, SMARCB1, SMARCE1, STK11, SUFU, TERC, TERT, TMEM127, TP53, TSC1, TSC2, VHL, WRN and WT1.     ALLERGIES:  is allergic to sulfa antibiotics.  MEDICATIONS:  Current Outpatient Medications  Medication  Sig Dispense Refill  . azelastine (ASTELIN) 0.1 % nasal spray Place into both nostrils daily as needed.    Marland Kitchen azelastine (OPTIVAR) 0.05 % ophthalmic solution Place into both eyes daily as needed.  3  . diltiazem (TIAZAC) 180 MG 24 hr capsule Take by mouth daily.    . ferrous sulfate 325 (65 FE) MG EC  tablet Take by mouth. (Patient not taking: No sig reported)    . gabapentin (NEURONTIN) 100 MG capsule Take 1 capsule (100 mg total) by mouth at bedtime. (Patient not taking: No sig reported) 30 capsule 2  . glimepiride (AMARYL) 2 MG tablet Take 2 mg by mouth daily with breakfast.    . HYDROcodone-acetaminophen (NORCO) 5-325 MG tablet Take 1 tablet by mouth every 6 (six) hours as needed for moderate pain. 60 tablet 0  . LORazepam (ATIVAN) 0.5 MG tablet TAKE 1 TABLET(0.5 MG) BY MOUTH EVERY 8 HOURS 60 tablet 0  . losartan (COZAAR) 100 MG tablet Take 100 mg by mouth daily.    . metFORMIN (GLUCOPHAGE-XR) 500 MG 24 hr tablet Take 500 mg by mouth 2 (two) times daily. 2 tabs twice a day  0  . morphine (MS CONTIN) 15 MG 12 hr tablet Take 1 tablet (15 mg total) by mouth every 12 (twelve) hours. 60 tablet 0  . ONE TOUCH ULTRA TEST test strip   1  . ONETOUCH DELICA LANCETS 70V MISC 1 each by XX route as directed. Check CBG's fasting once daily. Dx: E11.9    . traZODone (DESYREL) 50 MG tablet Take 0.5-1 tablets (25-50 mg total) by mouth at bedtime as needed for sleep. 30 tablet 0   No current facility-administered medications for this visit.   Facility-Administered Medications Ordered in Other Visits  Medication Dose Route Frequency Provider Last Rate Last Admin  . 0.9 %  sodium chloride infusion   Intravenous Once Sindy Guadeloupe, MD      . gemcitabine (GEMZAR) 1,400 mg in sodium chloride 0.9 % 250 mL chemo infusion  1,400 mg Intravenous Once Sindy Guadeloupe, MD      . heparin lock flush 100 unit/mL  500 Units Intravenous Once Sindy Guadeloupe, MD      . prochlorperazine (COMPAZINE) tablet 10 mg  10 mg Oral Once Sindy Guadeloupe, MD      . sodium chloride flush (NS) 0.9 % injection 10 mL  10 mL Intravenous PRN Sindy Guadeloupe, MD   10 mL at 03/04/21 1045    VITAL SIGNS: LMP  (LMP Unknown)  There were no vitals filed for this visit.  Estimated body mass index is 22.64 kg/m as calculated from the following:    Height as of 02/04/21: 5' 9" (1.753 m).   Weight as of an earlier encounter on 03/18/21: 153 lb 4.8 oz (69.5 kg).  LABS: CBC:    Component Value Date/Time   WBC 9.2 03/18/2021 0901   HGB 9.6 (L) 03/18/2021 0901   HCT 30.5 (L) 03/18/2021 0901   PLT 284 03/18/2021 0901   MCV 91.6 03/18/2021 0901   NEUTROABS 5.3 03/18/2021 0901   LYMPHSABS 2.6 03/18/2021 0901   MONOABS 1.0 03/18/2021 0901   EOSABS 0.2 03/18/2021 0901   BASOSABS 0.0 03/18/2021 0901   Comprehensive Metabolic Panel:    Component Value Date/Time   NA 138 03/18/2021 0901   K 4.1 03/18/2021 0901   CL 105 03/18/2021 0901   CO2 20 (L) 03/18/2021 0901   BUN 26 (H) 03/18/2021 0901   CREATININE  1.32 (H) 03/18/2021 0901   GLUCOSE 191 (H) 03/18/2021 0901   CALCIUM 9.4 03/18/2021 0901   AST 23 03/18/2021 0901   ALT 20 03/18/2021 0901   ALKPHOS 87 03/18/2021 0901   BILITOT 0.5 03/18/2021 0901   PROT 7.3 03/18/2021 0901   ALBUMIN 4.0 03/18/2021 0901    RADIOGRAPHIC STUDIES: No results found.  PERFORMANCE STATUS (ECOG) : 2 - Symptomatic, <50% confined to bed  Review of Systems Unless otherwise noted, a complete review of systems is negative.  Physical Exam General: NAD Pulmonary: Unlabored Extremities: no edema, no joint deformities Skin: no rashes Neurological: Weakness but otherwise nonfocal  IMPRESSION: Follow-up visit.    Patient reports that she is doing well.  She denies any significant changes or concerns today.  She continues to endorse chronic pain in bilateral hips, which is effectively managed with Norco/MS Contin.  On average, she takes Norco twice daily and finds it keeps her relatively comfortable.  Patient request refill of Norco today.  Patient says that she is sleeping better but often goes to bed late and sleeps into the late a.m.  She reports stable moods.  Appetite and performance status are reportedly stable.  Patient pending PET scan.  She has some questions about prognosis and next steps  and is interested in speaking with Dr. Janese Banks following imaging.  PLAN: -Continue current scope of treatment -Continue MS Contin.  Refill Norco #60 -Daily bowel regimen -Follow-up MyChart visit 1 month   Patient expressed understanding and was in agreement with this plan. She also understands that She can call the clinic at any time with any questions, concerns, or complaints.     Time Total: 15 minutes  Visit consisted of counseling and education dealing with the complex and emotionally intense issues of symptom management and palliative care in the setting of serious and potentially life-threatening illness.Greater than 50%  of this time was spent counseling and coordinating care related to the above assessment and plan.  Signed by: Altha Harm, PhD, NP-C

## 2021-03-18 NOTE — Progress Notes (Signed)
Hematology/Oncology Consult note [facility]  Telephone:([phone] [phone]  Patient Care Team: Dion Body, MD as PCP - General (Family Medicine) Clent Jacks, RN as Oncology Nurse Navigator   Name of the patient: Rhonda Day  [id number]  [birth year]   Date of visit: 03/18/21  Diagnosis- metastatic pancreatic cancer with liver metastases  Chief complaint/ Reason for visit-on treatment assessment prior to cycle 3-day 1 of gemcitabine chemotherapy  Heme/Onc history: Patient is a 80 year old female who underwent ultrasound abdomen in December 2021 which showed hypoechoic mass in the pancreatic tail measuring 2.1 x 1.8 x 4.5 cm which was not seen on prior CT scans on MRI exams a year ago. This was followed by a CT abdomen and pelvis with contrast which showed ill-defined hypervascular lesion in segment 8 of liver measuring 1.6 x 1.4 cm concerning for metastases. 2 other lesions 1.7 x 1 cm and 1.5 x 0.8 cm were also noted. Large mass in the body of pancreas measuring 5.6 x 2.2 x 2.9 cm. The lesion causes obstruction of the pancreatic duct resulting in ductal dilatation throughout the tail of the pancreas. 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 Splenic vein appears chronically occluded. Lesion also comes in contact with splenic artery which remains patent. This was followed by MRI abdomen which again showed similar findings. Multiple foci of restricted diffusion with hyperenhancement noted in the right hepatic lobe. Prominent portocaval lymph node seen for local nodal metastases along with a 5.3 cm body and tail pancreatic mass  Case was discussed at tumor board and lab was liver biopsy 2 weeks after patient initial Covid test that was positive. However at the day of the biopsy this lesion was in close association with other  vascular structures and given the potential higher risks liver biopsy was aborted and EUS was recommended. She is currently on Cardizem.  Patient had a EUS at [facility] Dr. [facility] [place]. Pathology was consistent with adenocarcinoma. Patient had a PET scan on 12/11/2020 which showed a hypermetabolic lesion in the medial aspect of the liver with an SUV of 4.2 there is a subtle hypodensity on the comparison CT measuring 9 mm at this level. The hypermetabolic lesion in the dome of the liver corresponds to the enhancing lesion on the comparison MRI. 3.4 x 2.7 cm lesion in the mid body of pancreas with an SUV of 5.1. No hypermetabolic peripancreatic lymph nodes.  Patient received 1 cycle of modified FOLFIRINOX chemotherapy on 12/30/2020. Following that patient had significant nausea vomiting fatigue and declining performance status requiring chemotherapy to be put on hold.  Patient switched to single agent gemcitabine chemotherapy 2 weeks on 1 week off  Interval history- patient reports some ongoing fatigue but is overall able to function independently.  Reports no significant side effects from present chemotherapy.  INTERVAL HISTORY Rhonda Day is a 80 y.o. female who has above history reviewed by me today presents for follow up visit for management of metastatic pancreatic cancer Problems and complaints are listed below: Patient follows up with Dr.Rao who is off today, I am covering her to see this patient. Patient has tolerated gemcitabine single agent treatments.  Denies any nausea vomiting diarrhea. She follows up with palliative care service and saw Altha Harm this morning.  Patient is concerned about rising tumor marker and there is a PET scan ordered for further evaluation of disease status.  ECOG PS- 1 Pain scale- 3   Review of systems- Review of Systems  Constitutional: Positive for malaise/fatigue. Negative for chills, fever and weight loss.  HENT: Negative for congestion, ear  discharge and nosebleeds.   Eyes: Negative for blurred vision.  Respiratory: Negative for cough, hemoptysis, sputum production, shortness of breath and wheezing.   Cardiovascular: Negative for chest pain, palpitations, orthopnea and claudication.  Gastrointestinal: Negative for abdominal pain, blood in stool, constipation, diarrhea, heartburn, melena, nausea and vomiting.  Genitourinary: Negative for dysuria, flank pain, frequency, hematuria and urgency.  Musculoskeletal: Negative for back pain, joint pain and myalgias.  Skin: Negative for rash.  Neurological: Negative for dizziness, tingling, focal weakness, seizures, weakness and headaches.  Endo/Heme/Allergies: Does not bruise/bleed easily.  Psychiatric/Behavioral: Negative for depression and suicidal ideas. The patient does not have insomnia.      Allergies  Allergen Reactions  . Sulfa Antibiotics Rash    Other reaction(s): RASH Other reaction(s): RASH      Past Medical History:  Diagnosis Date  . Bladder infection   . Diabetes mellitus without complication ([place])   . Family history of kidney cancer   . Family history of lung cancer   . Family history of stomach cancer   . Family history of throat cancer   . Hypertension   . Pancreatic cancer [facility])    with liver mass     Past Surgical History:  Procedure Laterality Date  . BACK SURGERY    . PORTA CATH INSERTION N/A 12/27/2020   Procedure: PORTA CATH INSERTION;  Surgeon: Algernon Huxley, MD;  Location: [facility];  Service: Cardiovascular;  Laterality: N/A;  . TUBAL LIGATION      Social History   Socioeconomic History  . Marital status: Married    Spouse name: Not on file  . Number of children: Not on file  . Years of education: Not on file  . Highest education level: Not on file  Occupational History  . Not on file  Tobacco Use  . Smoking status: Former Smoker    Quit date: 11/20/1979    Years since quitting: 41.3  . Smokeless tobacco: Never Used   Vaping Use  . Vaping Use: Never used  Substance and Sexual Activity  . Alcohol use: No    Alcohol/week: 0.0 standard drinks  . Drug use: No  . Sexual activity: Never  Other Topics Concern  . Not on file  Social History Narrative  . Not on file   Social Determinants of Health   Financial Resource Strain: Not on file  Food Insecurity: Not on file  Transportation Needs: Not on file  Physical Activity: Not on file  Stress: Not on file  Social Connections: Not on file  Intimate Partner Violence: Not on file    Family History  Problem Relation Age of Onset  . Kidney cancer Father 50  . COPD Mother   . Stomach cancer Maternal Aunt   . Throat cancer Maternal Uncle   . Liver cancer Paternal Uncle   . Esophageal cancer Paternal Grandfather   . Lung cancer Maternal Aunt   . Lung cancer Maternal Uncle   . Lung cancer Cousin   . Kidney cancer Cousin 77  . Testicular cancer Nephew        dx 102s  . Breast cancer Neg Hx      Current Outpatient Medications:  .  azelastine (ASTELIN) 0.1 % nasal spray, Place into both nostrils daily as needed., Disp: , Rfl:  .  azelastine (OPTIVAR) 0.05 % ophthalmic solution, Place into both eyes daily as needed., Disp: , Rfl: 3 .  diltiazem (TIAZAC) 180 MG 24 hr capsule, Take by mouth daily., Disp: , Rfl:  .  glimepiride (AMARYL) 2 MG tablet, Take 2 mg by mouth daily with breakfast., Disp: , Rfl:  .  LORazepam (ATIVAN) 0.5 MG tablet, TAKE 1 TABLET(0.5 MG) BY MOUTH EVERY 8 HOURS, Disp: 60 tablet, Rfl: 0 .  losartan (COZAAR) 100 MG tablet, Take 100 mg by mouth daily., Disp: , Rfl:  .  metFORMIN (GLUCOPHAGE-XR) 500 MG 24 hr tablet, Take 500 mg by mouth 2 (two) times daily. 2 tabs twice a day, Disp: , Rfl: 0 .  morphine (MS CONTIN) 15 MG 12 hr tablet, Take 1 tablet (15 mg total) by mouth every 12 (twelve) hours., Disp: 60 tablet, Rfl: 0 .  ONE TOUCH ULTRA TEST test strip, , Disp: , Rfl: 1 .  ONETOUCH DELICA LANCETS 85O MISC, 1 each by XX route as  directed. Check CBG's fasting once daily. Dx: E11.9, Disp: , Rfl:  .  traZODone (DESYREL) 50 MG tablet, Take 0.5-1 tablets (25-50 mg total) by mouth at bedtime as needed for sleep., Disp: 30 tablet, Rfl: 0 .  ferrous sulfate 325 (65 FE) MG EC tablet, Take by mouth. (Patient not taking: No sig reported), Disp: , Rfl:  .  gabapentin (NEURONTIN) 100 MG capsule, Take 1 capsule (100 mg total) by mouth at bedtime. (Patient not taking: No sig reported), Disp: 30 capsule, Rfl: 2 .  HYDROcodone-acetaminophen (NORCO) 5-325 MG tablet, Take 1 tablet by mouth every 6 (six) hours as needed for moderate pain., Disp: 60 tablet, Rfl: 0 No current facility-administered medications for this visit.  Facility-Administered Medications Ordered in Other Visits:  .  heparin lock flush 100 unit/mL, 500 Units, Intravenous, Once, Sindy Guadeloupe, MD .  sodium chloride flush (NS) 0.9 % injection 10 mL, 10 mL, Intravenous, PRN, Sindy Guadeloupe, MD, 10 mL at 03/04/21 1045  Physical exam:  Vitals:   03/18/21 0935  BP: 115/69  Pulse: 87  Resp: 16  Temp: 98.6 F (37 C)  Weight: 153 lb 4.8 oz (69.5 kg)   Physical Exam Constitutional:      General: She is not in acute distress. Cardiovascular:     Rate and Rhythm: Normal rate and regular rhythm.     Heart sounds: Normal heart sounds.  Pulmonary:     Effort: Pulmonary effort is normal.     Breath sounds: Normal breath sounds.  Abdominal:     General: Bowel sounds are normal.     Palpations: Abdomen is soft.  Skin:    General: Skin is warm and dry.  Neurological:     Mental Status: She is alert and oriented to person, place, and time.      CMP Latest Ref Rng & Units 03/18/2021  Glucose 70 - 99 mg/dL 191(H)  BUN 8 - 23 mg/dL 26(H)  Creatinine 0.44 - 1.00 mg/dL 1.32(H)  Sodium 135 - 145 mmol/L 138  Potassium 3.5 - 5.1 mmol/L 4.1  Chloride 98 - 111 mmol/L 105  CO2 22 - 32 mmol/L 20(L)  Calcium 8.9 - 10.3 mg/dL 9.4  Total Protein 6.5 - 8.1 g/dL 7.3  Total  Bilirubin 0.3 - 1.2 mg/dL 0.5  Alkaline Phos 38 - 126 U/L 87  AST 15 - 41 U/L 23  ALT 0 - 44 U/L 20   CBC Latest Ref Rng & Units 03/18/2021  WBC 4.0 -  10.5 K/uL 9.2  Hemoglobin 12.0 - 15.0 g/dL 9.6(L)  Hematocrit 36.0 - 46.0 % 30.5(L)  Platelets 150 - 400 K/uL 284     Assessment and plan- Patient is a 80 y.o. 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  She is here for on treatment assessment prior to cycle 3 day 1 of gemcitabine chemotherapy  1. Pancreatic cancer metastasized to liver ([place])   2. Encounter for antineoplastic chemotherapy   3. Antineoplastic chemotherapy induced anemia   4. Stage 3b chronic kidney disease ([place])   5. Neoplasm related pain    #Metastatic pancreatic cancer Currently on palliative chemotherapy with single agent gemcitabine.  Previously she did not tolerate 1 cycle of FOLFIRINOX. Labs reviewed and discussed with patient.  Proceed with gemcitabine today. 03/04/2021 CA 19-9 increased to 1476 comparing to the level in February 2022. Today's tumor marker is pending. PET scan to evaluate disease status.  Chemo induced anemia: Hemoglobin is stable continue to monitor Chronic kidney disease, creatinine slightly increased to 1.32.  Encourage oral hydration and avoid nephrotoxins. She will receive 1 L of IV fluids today for hydration.  Neoplasm related pain, follow-up with palliative care service.  Refill Norco. RTC, lab gemcitabine 1 week RTC lab Dr. Janese Banks and gemcitabine in 3 weeks   Earlie Server, MD, PhD Hematology Oncology [place] at [facility] 03/18/2021

## 2021-03-18 NOTE — Progress Notes (Signed)
Patient has chronic hip pain that is 3/10 on pain scale today.

## 2021-03-18 NOTE — Progress Notes (Signed)
Nutrition Follow-up:  Patient with metastatic pancreatic cancer with metastatic disease to liver.  Patient receiving gemcitabine.    Met with patient during infusion.  Patient reports good appetite.  Typically eats breakfast (cereal and fruit) and late afternoon/early evening meal (chicken, chinese food, soups) and then snacks as does not like to eat to late at night. Continues to enjoy cottage cheese and tomatoes, eggs.    Denies any nutrition impact symptoms    Medications: reviewed  Labs: reviewed  Anthropometrics:   Weight 153 lb 4.8 today  148 lb 6.4 oz on 4/19  UBW 150s   NUTRITION DIAGNOSIS:  Inadequate oral intake improving    INTERVENTION:  Encouraged good sources of protein and weight maintenance.    MONITORING, EVALUATION, GOAL: weight trends, intake   NEXT VISIT: Tuesday, June 28 during infusion  Rhonda Day, [place], [place] Registered Dietitian [phone] (mobile)

## 2021-03-19 ENCOUNTER — Encounter: Payer: Self-pay | Admitting: Oncology

## 2021-03-19 LAB — CANCER ANTIGEN 19-9: CA 19-9: 1664 U/mL — ABNORMAL HIGH (ref 0–35)

## 2021-03-20 ENCOUNTER — Other Ambulatory Visit: Payer: Self-pay | Admitting: *Deleted

## 2021-03-20 MED ORDER — TRAZODONE HCL 50 MG PO TABS: 25.0000 mg | ORAL_TABLET | Freq: Every evening | ORAL | 3 refills | Status: DC | PRN

## 2021-03-25 ENCOUNTER — Other Ambulatory Visit: Payer: Self-pay

## 2021-03-25 ENCOUNTER — Inpatient Hospital Stay: Payer: Medicare PPO

## 2021-03-25 ENCOUNTER — Other Ambulatory Visit: Payer: Self-pay | Admitting: Internal Medicine

## 2021-03-25 ENCOUNTER — Inpatient Hospital Stay: Payer: Medicare PPO | Attending: Oncology

## 2021-03-25 ENCOUNTER — Other Ambulatory Visit: Payer: Self-pay | Admitting: Oncology

## 2021-03-25 VITALS — BP 123/57 | HR 79 | Temp 97.0°F | Resp 19 | Wt 154.2 lb

## 2021-03-25 DIAGNOSIS — Z79899 Other long term (current) drug therapy: Secondary | ICD-10-CM | POA: Diagnosis not present

## 2021-03-25 DIAGNOSIS — C258 Malignant neoplasm of overlapping sites of pancreas: Secondary | ICD-10-CM | POA: Diagnosis present

## 2021-03-25 DIAGNOSIS — Z87891 Personal history of nicotine dependence: Secondary | ICD-10-CM | POA: Insufficient documentation

## 2021-03-25 DIAGNOSIS — C787 Secondary malignant neoplasm of liver and intrahepatic bile duct: Secondary | ICD-10-CM | POA: Diagnosis present

## 2021-03-25 DIAGNOSIS — Z5111 Encounter for antineoplastic chemotherapy: Secondary | ICD-10-CM | POA: Insufficient documentation

## 2021-03-25 DIAGNOSIS — Z7984 Long term (current) use of oral hypoglycemic drugs: Secondary | ICD-10-CM | POA: Diagnosis not present

## 2021-03-25 DIAGNOSIS — E119 Type 2 diabetes mellitus without complications: Secondary | ICD-10-CM | POA: Insufficient documentation

## 2021-03-25 DIAGNOSIS — I10 Essential (primary) hypertension: Secondary | ICD-10-CM | POA: Insufficient documentation

## 2021-03-25 LAB — COMPREHENSIVE METABOLIC PANEL
ALT: 23 U/L (ref 0–44)
AST: 20 U/L (ref 15–41)
Albumin: 4.2 g/dL (ref 3.5–5.0)
Alkaline Phosphatase: 79 U/L (ref 38–126)
Anion gap: 12 (ref 5–15)
BUN: 30 mg/dL — ABNORMAL HIGH (ref 8–23)
CO2: 21 mmol/L — ABNORMAL LOW (ref 22–32)
Calcium: 9.5 mg/dL (ref 8.9–10.3)
Chloride: 104 mmol/L (ref 98–111)
Creatinine, Ser: 1.43 mg/dL — ABNORMAL HIGH (ref 0.44–1.00)
GFR, Estimated: 38 mL/min — ABNORMAL LOW (ref >60)
Glucose, Bld: 304 mg/dL — ABNORMAL HIGH (ref 70–99)
Potassium: 4.3 mmol/L (ref 3.5–5.1)
Sodium: 137 mmol/L (ref 135–145)
Total Bilirubin: 0.3 mg/dL (ref 0.3–1.2)
Total Protein: 7.4 g/dL (ref 6.5–8.1)

## 2021-03-25 LAB — CBC WITH DIFFERENTIAL/PLATELET
Abs Immature Granulocytes: 0.02 10*3/uL (ref 0.00–0.07)
Basophils Absolute: 0 10*3/uL (ref 0.0–0.1)
Basophils Relative: 0 %
Eosinophils Absolute: 0.1 10*3/uL (ref 0.0–0.5)
Eosinophils Relative: 1 %
HCT: 29.8 % — ABNORMAL LOW (ref 36.0–46.0)
Hemoglobin: 9.3 g/dL — ABNORMAL LOW (ref 12.0–15.0)
Immature Granulocytes: 1 %
Lymphocytes Relative: 33 %
Lymphs Abs: 1.4 10*3/uL (ref 0.7–4.0)
MCH: 29.2 pg (ref 26.0–34.0)
MCHC: 31.2 g/dL (ref 30.0–36.0)
MCV: 93.4 fL (ref 80.0–100.0)
Monocytes Absolute: 0.5 10*3/uL (ref 0.1–1.0)
Monocytes Relative: 11 %
Neutro Abs: 2.3 10*3/uL (ref 1.7–7.7)
Neutrophils Relative %: 54 %
Platelets: 337 10*3/uL (ref 150–400)
RBC: 3.19 MIL/uL — ABNORMAL LOW (ref 3.87–5.11)
RDW: 17.1 % — ABNORMAL HIGH (ref 11.5–15.5)
WBC: 4.2 10*3/uL (ref 4.0–10.5)
nRBC: 0 % (ref 0.0–0.2)

## 2021-03-25 MED ORDER — PROCHLORPERAZINE MALEATE 10 MG PO TABS
10.0000 mg | ORAL_TABLET | Freq: Once | ORAL | Status: AC
  Administered 2021-03-25: 10 mg via ORAL
  Filled 2021-03-25: qty 1

## 2021-03-25 MED ORDER — SODIUM CHLORIDE 0.9 % IV SOLN
1400.0000 mg | Freq: Once | INTRAVENOUS | Status: AC
  Administered 2021-03-25: 1400 mg via INTRAVENOUS
  Filled 2021-03-25: qty 10.52

## 2021-03-25 MED ORDER — SODIUM CHLORIDE 0.9% FLUSH
10.0000 mL | Freq: Once | INTRAVENOUS | Status: AC
  Administered 2021-03-25: 10 mL via INTRAVENOUS
  Filled 2021-03-25: qty 10

## 2021-03-25 MED ORDER — SODIUM CHLORIDE 0.9 % IV SOLN
Freq: Once | INTRAVENOUS | Status: AC
Start: 2021-03-25 — End: 2021-03-25
  Filled 2021-03-25: qty 250

## 2021-03-25 MED ORDER — HEPARIN SOD (PORK) LOCK FLUSH 100 UNIT/ML IV SOLN
500.0000 [IU] | Freq: Once | INTRAVENOUS | Status: AC | PRN
  Administered 2021-03-25: 500 [IU]
  Filled 2021-03-25: qty 5

## 2021-03-25 NOTE — Patient Instructions (Signed)
[facility] [place] [facility]  Discharge Instructions: Thank you for choosing [facility] to provide your oncology and hematology care.  If you have a lab appointment with the Cancer Center, please go directly to the Cancer Center and check in at the registration area.  Wear comfortable clothing and clothing appropriate for easy access to any Portacath or PICC line.   We strive to give you quality time with your provider. You may need to reschedule your appointment if you arrive late (15 or more minutes).  Arriving late affects you and other patients whose appointments are after yours.  Also, if you miss three or more appointments without notifying the office, you may be dismissed from the clinic at the provider's discretion.      For prescription refill requests, have your pharmacy contact our office and allow 72 hours for refills to be completed.    Today you received the following chemotherapy and/or immunotherapy agents Gemzar   To help prevent nausea and vomiting after your treatment, we encourage you to take your nausea medication as directed.  BELOW ARE SYMPTOMS THAT SHOULD BE REPORTED IMMEDIATELY: *FEVER GREATER THAN 100.4 F (38 C) OR HIGHER *CHILLS OR SWEATING *NAUSEA AND VOMITING THAT IS NOT CONTROLLED WITH YOUR NAUSEA MEDICATION *UNUSUAL SHORTNESS OF BREATH *UNUSUAL BRUISING OR BLEEDING *URINARY PROBLEMS (pain or burning when urinating, or frequent urination) *BOWEL PROBLEMS (unusual diarrhea, constipation, pain near the anus) TENDERNESS IN MOUTH AND THROAT WITH OR WITHOUT PRESENCE OF ULCERS (sore throat, sores in mouth, or a toothache) UNUSUAL RASH, SWELLING OR PAIN  UNUSUAL VAGINAL DISCHARGE OR ITCHING   Items with * indicate a potential emergency and should be followed up as soon as possible or go to the Emergency Department if any problems should occur.  Please show the CHEMOTHERAPY ALERT CARD or IMMUNOTHERAPY ALERT CARD at check-in to the  Emergency Department and triage nurse.  Should you have questions after your visit or need to cancel or reschedule your appointment, please contact [facility] [place] [facility]  [phone] and follow the prompts.  Office hours are 8:00 a.m. to 4:30 p.m. Monday - Friday. Please note that voicemails left after 4:00 p.m. may not be returned until the following business day.  We are closed weekends and major holidays. You have access to a nurse at all times for urgent questions. Please call the main number to the clinic [phone] and follow the prompts.  For any non-urgent questions, you may also contact your provider using MyChart. We now offer e-Visits for anyone 78 and older to request care online for non-urgent symptoms. For details visit [email or web address].   Also download the MyChart app! Go to the app store, search "MyChart", open the app, select [facility], and log in with your MyChart username and password.  Due to Covid, a mask is required upon entering the hospital/clinic. If you do not have a mask, one will be given to you upon arrival. For doctor visits, patients may have 1 support person aged 18 or older with them. For treatment visits, patients cannot have anyone with them due to current Covid guidelines and our immunocompromised population.  

## 2021-03-25 NOTE — Progress Notes (Signed)
Glucose 304. Dr. Rogue Bussing made aware. Per Dr. Rogue Bussing, okay to proceed with patient. Patient is to take her diabetic medications, check glucose TID, and if glucose stays >300 to contact PCP. Patient advised of information and reports she forgot to take her medication this AM. Patient denies any further questions or concerns.

## 2021-03-26 ENCOUNTER — Encounter: Payer: Self-pay | Admitting: Oncology

## 2021-04-01 ENCOUNTER — Other Ambulatory Visit: Payer: Self-pay

## 2021-04-01 ENCOUNTER — Ambulatory Visit
Admission: RE | Admit: 2021-04-01 | Discharge: 2021-04-01 | Disposition: A | Discharge status: Home / Self Care | Payer: Medicare PPO | Source: Ambulatory Visit | Attending: Oncology | Admitting: Oncology

## 2021-04-01 DIAGNOSIS — C259 Malignant neoplasm of pancreas, unspecified: Secondary | ICD-10-CM

## 2021-04-01 DIAGNOSIS — C787 Secondary malignant neoplasm of liver and intrahepatic bile duct: Secondary | ICD-10-CM | POA: Diagnosis present

## 2021-04-01 LAB — GLUCOSE, CAPILLARY: Glucose-Capillary: 157 mg/dL — ABNORMAL HIGH (ref 70–99)

## 2021-04-01 IMAGING — PT NM PET TUM IMG RESTAG (PS) SKULL BASE T - THIGH
1 of 10 series · 1 of 25 positions shown · non-contrast
Comparison: Prior PET-CT [DATE] and prior MRI abdomen
[DATE]

CLINICAL DATA: Subsequent treatment strategy for pancreatic cancer.

EXAM:
NUCLEAR MEDICINE PET SKULL BASE TO THIGH
TECHNIQUE: 8.28 mCi F-18 FDG was injected intravenously. Full-ring PET imaging
was performed from the skull base to thigh after the radiotracer. CT
data was obtained and used for attenuation correction and anatomic
localization.
Fasting blood glucose: 157 mg/dl

[Series 3: ct wb 5.0 b30f · axial · 5.0mm · 0.98mm/px · 1 of 290 slices shown]
[im 290/290  brain]
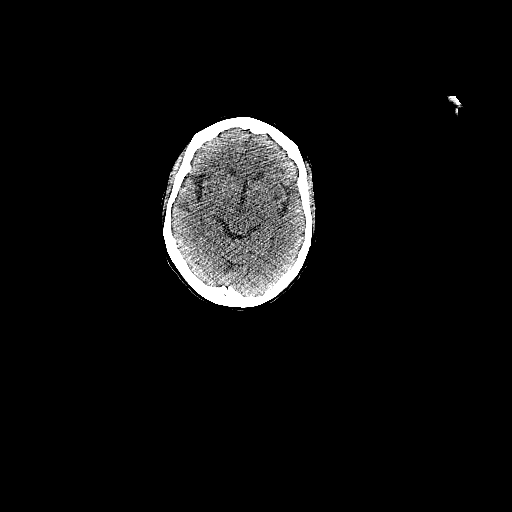

[1 of 25 positions shown; findings below may reference images not displayed]

FINDINGS: Mediastinal blood pool activity: SUV max

Liver activity: SUV max NA

NECK: No hypermetabolic lymph nodes in the neck.

Incidental CT findings: none

CHEST: No hypermetabolic mediastinal or hilar nodes. No suspicious
pulmonary nodules on the CT scan.

Incidental CT findings: Stable scattered vascular calcifications.
Stable left thyroid goiter with 2.5 cm lesion which is not
hypermetabolic. No acute pulmonary findings or worrisome pulmonary
nodules. The right Port-A-Cath is stable.

ABDOMEN/PELVIS: The pancreatic body mass appears relatively stable
in size measuring approximately 4.9 x 2.3 cm. SUV max is 4.65 and
was previously 5.13.

The metastatic hepatic lesion is very difficult to measure on the CT
scan. It measures approximately 10 mm. The SUV max is 3.65 and was
previously 4.24. No new hepatic lesions are identified. No enlarged
or hypermetabolic abdominal lymph nodes.

Incidental CT findings: Diffuse fatty infiltration of the liver.
Left renal calculus is stable.

SKELETON: No findings suspicious for osseous metastatic disease.

Incidental CT findings: none
IMPRESSION: 1. The pancreatic body lesion is relatively stable in size and
demonstrates a slight decrease in FDG uptake.
2. Stable sized metastatic hepatic lesion at the right hepatic dome.
FDG uptake is slightly decreased since the prior study.
3. No new metastatic disease.

## 2021-04-01 MED ORDER — FLUDEOXYGLUCOSE F - 18 (FDG) INJECTION
8.0000 | Freq: Once | INTRAVENOUS | Status: AC | PRN
  Administered 2021-04-01: 8.28 via INTRAVENOUS

## 2021-04-04 ENCOUNTER — Telehealth: Payer: Self-pay | Admitting: *Deleted

## 2021-04-04 NOTE — Telephone Encounter (Signed)
I called and spoke with patient regarding her PET scan results.  All questions answered to her verbalized satisfaction.  Patient scheduled to see Dr. Janese Banks next week.

## 2021-04-04 NOTE — Telephone Encounter (Signed)
Patient called requesting Josh call her to go over her PET results with her    IMPRESSION: 1. The pancreatic body lesion is relatively stable in size and demonstrates a slight decrease in FDG uptake. 2. Stable sized metastatic hepatic lesion at the right hepatic dome. FDG uptake is slightly decreased since the prior study. 3. No new metastatic disease.     Electronically Signed   By: Marijo Sanes M.D.   On: 04/01/2021 14:39

## 2021-04-08 ENCOUNTER — Inpatient Hospital Stay: Payer: Medicare PPO

## 2021-04-08 ENCOUNTER — Encounter: Payer: Self-pay | Admitting: Oncology

## 2021-04-08 ENCOUNTER — Inpatient Hospital Stay: Payer: Medicare PPO | Admitting: Oncology

## 2021-04-08 ENCOUNTER — Inpatient Hospital Stay (HOSPITAL_BASED_OUTPATIENT_CLINIC_OR_DEPARTMENT_OTHER): Payer: Medicare PPO

## 2021-04-08 VITALS — BP 115/64 | HR 83 | Temp 97.1°F | Resp 20 | Wt 153.9 lb

## 2021-04-08 DIAGNOSIS — Z7189 Other specified counseling: Secondary | ICD-10-CM | POA: Diagnosis not present

## 2021-04-08 DIAGNOSIS — Z5111 Encounter for antineoplastic chemotherapy: Secondary | ICD-10-CM | POA: Diagnosis not present

## 2021-04-08 DIAGNOSIS — C259 Malignant neoplasm of pancreas, unspecified: Secondary | ICD-10-CM

## 2021-04-08 DIAGNOSIS — C787 Secondary malignant neoplasm of liver and intrahepatic bile duct: Secondary | ICD-10-CM

## 2021-04-08 LAB — CBC WITH DIFFERENTIAL/PLATELET
Abs Immature Granulocytes: 0.05 10*3/uL (ref 0.00–0.07)
Basophils Absolute: 0 10*3/uL (ref 0.0–0.1)
Basophils Relative: 0 %
Eosinophils Absolute: 0.2 10*3/uL (ref 0.0–0.5)
Eosinophils Relative: 2 %
HCT: 29.9 % — ABNORMAL LOW (ref 36.0–46.0)
Hemoglobin: 9.3 g/dL — ABNORMAL LOW (ref 12.0–15.0)
Immature Granulocytes: 1 %
Lymphocytes Relative: 25 %
Lymphs Abs: 2.2 10*3/uL (ref 0.7–4.0)
MCH: 29.3 pg (ref 26.0–34.0)
MCHC: 31.1 g/dL (ref 30.0–36.0)
MCV: 94.3 fL (ref 80.0–100.0)
Monocytes Absolute: 0.9 10*3/uL (ref 0.1–1.0)
Monocytes Relative: 10 %
Neutro Abs: 5.5 10*3/uL (ref 1.7–7.7)
Neutrophils Relative %: 62 %
Platelets: 276 10*3/uL (ref 150–400)
RBC: 3.17 MIL/uL — ABNORMAL LOW (ref 3.87–5.11)
RDW: 17.1 % — ABNORMAL HIGH (ref 11.5–15.5)
WBC: 8.8 10*3/uL (ref 4.0–10.5)
nRBC: 0 % (ref 0.0–0.2)

## 2021-04-08 LAB — COMPREHENSIVE METABOLIC PANEL
ALT: 16 U/L (ref 0–44)
AST: 18 U/L (ref 15–41)
Albumin: 4 g/dL (ref 3.5–5.0)
Alkaline Phosphatase: 87 U/L (ref 38–126)
Anion gap: 11 (ref 5–15)
BUN: 41 mg/dL — ABNORMAL HIGH (ref 8–23)
CO2: 21 mmol/L — ABNORMAL LOW (ref 22–32)
Calcium: 9.4 mg/dL (ref 8.9–10.3)
Chloride: 104 mmol/L (ref 98–111)
Creatinine, Ser: 1.79 mg/dL — ABNORMAL HIGH (ref 0.44–1.00)
GFR, Estimated: 29 mL/min — ABNORMAL LOW (ref >60)
Glucose, Bld: 157 mg/dL — ABNORMAL HIGH (ref 70–99)
Potassium: 4.2 mmol/L (ref 3.5–5.1)
Sodium: 136 mmol/L (ref 135–145)
Total Bilirubin: 0.5 mg/dL (ref 0.3–1.2)
Total Protein: 7.3 g/dL (ref 6.5–8.1)

## 2021-04-08 MED ORDER — SODIUM CHLORIDE 0.9 % IV SOLN
1400.0000 mg | Freq: Once | INTRAVENOUS | Status: AC
  Administered 2021-04-08: 1400 mg via INTRAVENOUS
  Filled 2021-04-08: qty 26.3

## 2021-04-08 MED ORDER — HEPARIN SOD (PORK) LOCK FLUSH 100 UNIT/ML IV SOLN
500.0000 [IU] | Freq: Once | INTRAVENOUS | Status: AC
  Administered 2021-04-08: 500 [IU] via INTRAVENOUS
  Filled 2021-04-08: qty 5

## 2021-04-08 MED ORDER — HEPARIN SOD (PORK) LOCK FLUSH 100 UNIT/ML IV SOLN
INTRAVENOUS | Status: AC
  Filled 2021-04-08: qty 5

## 2021-04-08 MED ORDER — SODIUM CHLORIDE 0.9% FLUSH
10.0000 mL | Freq: Once | INTRAVENOUS | Status: AC
  Administered 2021-04-08: 10 mL via INTRAVENOUS
  Filled 2021-04-08: qty 10

## 2021-04-08 MED ORDER — SODIUM CHLORIDE 0.9 % IV SOLN
INTRAVENOUS | Status: DC
  Filled 2021-04-08 (×2): qty 250

## 2021-04-08 MED ORDER — SODIUM CHLORIDE 0.9 % IV SOLN
Freq: Once | INTRAVENOUS | Status: AC
  Filled 2021-04-08: qty 250

## 2021-04-08 MED ORDER — PROCHLORPERAZINE MALEATE 10 MG PO TABS
10.0000 mg | ORAL_TABLET | Freq: Once | ORAL | Status: AC
  Administered 2021-04-08: 10 mg via ORAL
  Filled 2021-04-08: qty 1

## 2021-04-08 NOTE — Progress Notes (Signed)
Per MD ok to treat with SCr 

## 2021-04-08 NOTE — Patient Instructions (Signed)
[facility] ONCOLOGY  Discharge Instructions: Thank you for choosing [facility] to provide your oncology and hematology care.  If you have a lab appointment with the [facility], please go directly to the [facility] and check in at the registration area.  Wear comfortable clothing and clothing appropriate for easy access to any Portacath or PICC line.   We strive to give you quality time with your provider. You may need to reschedule your appointment if you arrive late (15 or more minutes).  Arriving late affects you and other patients whose appointments are after yours.  Also, if you miss three or more appointments without notifying the office, you may be dismissed from the clinic at the provider's discretion.      For prescription refill requests, have your pharmacy contact our office and allow 72 hours for refills to be completed.    Today you received the following chemotherapy and/or immunotherapy agent : Gemzar    To help prevent nausea and vomiting after your treatment, we encourage you to take your nausea medication as directed.  BELOW ARE SYMPTOMS THAT SHOULD BE REPORTED IMMEDIATELY: *FEVER GREATER THAN 100.4 F (38 C) OR HIGHER *CHILLS OR SWEATING *NAUSEA AND VOMITING THAT IS NOT CONTROLLED WITH YOUR NAUSEA MEDICATION *UNUSUAL SHORTNESS OF BREATH *UNUSUAL BRUISING OR BLEEDING *URINARY PROBLEMS (pain or burning when urinating, or frequent urination) *BOWEL PROBLEMS (unusual diarrhea, constipation, pain near the anus) TENDERNESS IN MOUTH AND THROAT WITH OR WITHOUT PRESENCE OF ULCERS (sore throat, sores in mouth, or a toothache) UNUSUAL RASH, SWELLING OR PAIN  UNUSUAL VAGINAL DISCHARGE OR ITCHING   Items with * indicate a potential emergency and should be followed up as soon as possible or go to the Emergency Department if any problems should occur.  Please show the CHEMOTHERAPY ALERT CARD or IMMUNOTHERAPY ALERT CARD at check-in to  the Emergency Department and triage nurse.  Should you have questions after your visit or need to cancel or reschedule your appointment, please contact [facility]  [phone] and follow the prompts.  Office hours are 8:00 a.m. to 4:30 p.m. Monday - Friday. Please note that voicemails left after 4:00 p.m. may not be returned until the following business day.  We are closed weekends and major holidays. You have access to a nurse at all times for urgent questions. Please call the main number to the clinic [phone] and follow the prompts.  For any non-urgent questions, you may also contact your provider using MyChart. We now offer e-Visits for anyone 35 and older to request care online for non-urgent symptoms. For details visit [email or web address].   Also download the MyChart app! Go to the app store, search "MyChart", open the app, select [facility], and log in with your MyChart username and password.  Due to Covid, a mask is required upon entering the hospital/clinic. If you do not have a mask, one will be given to you upon arrival. For doctor visits, patients may have 1 support person aged 37 or older with them. For treatment visits, patients cannot have anyone with them due to current Covid guidelines and our immunocompromised population.

## 2021-04-08 NOTE — Progress Notes (Signed)
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  This was followed by a CT abdomen and pelvis with contrast which showed ill-defined hypervascular lesion in segment 8 of liver measuring 1.6 x 1.4 cm concerning for metastases.  2 other lesions 1.7 x 1 cm and 1.5 x 0.8 cm were also noted.  Large mass in the body of pancreas measuring 5.6 x 2.2 x 2.9 cm.  The lesion causes obstruction of the pancreatic duct resulting in ductal dilatation throughout the tail of the pancreas.  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  Splenic vein appears chronically occluded.  Lesion also comes in contact with splenic artery which remains patent.  This was followed by MRI abdomen which again showed similar findings.  Multiple foci of restricted diffusion with hyperenhancement noted in the right hepatic lobe.  Prominent portocaval lymph node seen for local nodal metastases along with a 5.3 cm body and tail pancreatic mass   Case was discussed at tumor board and lab was liver biopsy 2 weeks after patient initial Covid test that was positive.  However at the day of the biopsy this lesion was in  close association with other vascular structures and given the potential higher risks liver biopsy was aborted and EUS was recommended.  She is currently on Cardizem.     Patient had a EUS at [facility] By Dr. Mont Dutton. Pathology was consistent with adenocarcinoma. Patient had a PET scan on 12/11/2020 which showed a hypermetabolic lesion in the medial aspect of the liver with an SUV of 4.2 there is a subtle hypodensity on the comparison CT measuring 9 mm at this level. The hypermetabolic lesion in the dome of the liver corresponds to the enhancing lesion on the comparison MRI. 3.4 x 2.7 cm lesion in the mid body of pancreas with an SUV of 5.1. No hypermetabolic peripancreatic lymph nodes.    Patient received 1 cycle of modified FOLFIRINOX chemotherapy on 12/30/2020.  Following that patient had significant nausea vomiting fatigue and declining performance status requiring chemotherapy to be put on hold.  Patient switched to single agent gemcitabine chemotherapy 2 weeks on 1 week off  Interval history-patient reports tolerating present chemotherapy well.  Does not have significant nausea or vomiting.  Energy levels are stable.   ECOG PS- 1-2 Pain scale- 2   Review of systems- Review of Systems  Constitutional:  Positive for malaise/fatigue. Negative for chills, fever and weight loss.  HENT:  Negative for congestion, ear discharge and nosebleeds.   Eyes:  Negative for blurred vision.  Respiratory:  Negative for cough, hemoptysis, sputum production, shortness of breath and wheezing.   Cardiovascular:  Negative for chest pain, palpitations, orthopnea and claudication.  Gastrointestinal:  Negative  for abdominal pain, blood in stool, constipation, diarrhea, heartburn, melena, nausea and vomiting.  Genitourinary:  Negative for dysuria, flank pain, frequency, hematuria and urgency.  Musculoskeletal:  Negative for back pain, joint pain and myalgias.  Skin:  Negative for rash.  Neurological:  Negative for  dizziness, tingling, focal weakness, seizures, weakness and headaches.  Endo/Heme/Allergies:  Does not bruise/bleed easily.  Psychiatric/Behavioral:  Negative for depression and suicidal ideas. The patient does not have insomnia.      Allergies  Allergen Reactions   Sulfa Antibiotics Rash    Other reaction(s): RASH Other reaction(s): RASH      Past Medical History:  Diagnosis Date   Bladder infection    Diabetes mellitus without complication ([place])    Family history of kidney cancer    Family history of lung cancer    Family history of stomach cancer    Family history of throat cancer    Hypertension    Pancreatic cancer ([place])    with liver mass     Past Surgical History:  Procedure Laterality Date   BACK SURGERY     PORTA CATH INSERTION N/A 12/27/2020   Procedure: PORTA CATH INSERTION;  Surgeon: Algernon Huxley, MD;  Location: [facility];  Service: Cardiovascular;  Laterality: N/A;   TUBAL LIGATION      Social History   Socioeconomic History   Marital status: Married    Spouse name: Not on file   Number of children: Not on file   Years of education: Not on file   Highest education level: Not on file  Occupational History   Not on file  Tobacco Use   Smoking status: Former    Pack years: 0.00    Types: Cigarettes    Quit date: 11/20/1979    Years since quitting: 41.4   Smokeless tobacco: Never  Vaping Use   Vaping Use: Never used  Substance and Sexual Activity   Alcohol use: No    Alcohol/week: 0.0 standard drinks   Drug use: No   Sexual activity: Never  Other Topics Concern   Not on file  Social History Narrative   Not on file   Social Determinants of Health   Financial Resource Strain: Not on file  Food Insecurity: Not on file  Transportation Needs: Not on file  Physical Activity: Not on file  Stress: Not on file  Social Connections: Not on file  Intimate Partner Violence: Not on file    Family History  Problem Relation Age of Onset    Kidney cancer Father 42   COPD Mother    Stomach cancer Maternal Aunt    Throat cancer Maternal Uncle    Liver cancer Paternal Uncle    Esophageal cancer Paternal Grandfather    Lung cancer Maternal Aunt    Lung cancer Maternal Uncle    Lung cancer Cousin    Kidney cancer Cousin 78   Testicular cancer Nephew        dx 20s   Breast cancer Neg Hx      Current Outpatient Medications:    azelastine (ASTELIN) 0.1 % nasal spray, Place into both nostrils daily as needed., Disp: , Rfl:    azelastine (OPTIVAR) 0.05 % ophthalmic solution, Place into both eyes daily as needed., Disp: , Rfl: 3   diltiazem (TIAZAC) 180 MG 24 hr capsule, Take by mouth daily., Disp: , Rfl:    glimepiride (AMARYL) 2 MG tablet, Take 2 mg by mouth daily with breakfast., Disp: , Rfl:  HYDROcodone-acetaminophen (NORCO) 5-325 MG tablet, Take 1 tablet by mouth every 6 (six) hours as needed for moderate pain., Disp: 60 tablet, Rfl: 0   LORazepam (ATIVAN) 0.5 MG tablet, TAKE 1 TABLET(0.5 MG) BY MOUTH EVERY 8 HOURS, Disp: 60 tablet, Rfl: 0   losartan (COZAAR) 100 MG tablet, Take 100 mg by mouth daily., Disp: , Rfl:    metFORMIN (GLUCOPHAGE-XR) 500 MG 24 hr tablet, Take 500 mg by mouth 2 (two) times daily. 2 tabs twice a day, Disp: , Rfl: 0   ONE TOUCH ULTRA TEST test strip, , Disp: , Rfl: 1   traZODone (DESYREL) 50 MG tablet, Take 0.5-1 tablets (25-50 mg total) by mouth at bedtime as needed for sleep., Disp: 30 tablet, Rfl: 3   ferrous sulfate 325 (65 FE) MG EC tablet, Take by mouth. (Patient not taking: No sig reported), Disp: , Rfl:    gabapentin (NEURONTIN) 100 MG capsule, Take 1 capsule (100 mg total) by mouth at bedtime. (Patient not taking: No sig reported), Disp: 30 capsule, Rfl: 2   morphine (MS CONTIN) 15 MG 12 hr tablet, Take 1 tablet (15 mg total) by mouth every 12 (twelve) hours. (Patient not taking: Reported on 04/08/2021), Disp: 60 tablet, Rfl: 0   ONETOUCH DELICA LANCETS 93G MISC, 1 each by XX route as directed.  Check CBG's fasting once daily. Dx: E11.9, Disp: , Rfl:  No current facility-administered medications for this visit.  Facility-Administered Medications Ordered in Other Visits:    0.9 %  sodium chloride infusion, , Intravenous, Continuous, Sindy Guadeloupe, MD, Stopped at 04/08/21 1148   sodium chloride flush (NS) 0.9 % injection 10 mL, 10 mL, Intravenous, PRN, Sindy Guadeloupe, MD, 10 mL at 03/04/21 1045  Physical exam:  Vitals:   04/08/21 0905  BP: 115/64  Pulse: 83  Resp: 20  Temp: (!) 97.1 F (36.2 C)  TempSrc: Tympanic  SpO2: 96%  Weight: 153 lb 14.4 oz (69.8 kg)   Physical Exam   CMP Latest Ref Rng & Units 04/08/2021  Glucose 70 - 99 mg/dL 157(H)  BUN 8 - 23 mg/dL 41(H)  Creatinine 0.44 - 1.00 mg/dL 1.79(H)  Sodium 135 - 145 mmol/L 136  Potassium 3.5 - 5.1 mmol/L 4.2  Chloride 98 - 111 mmol/L 104  CO2 22 - 32 mmol/L 21(L)  Calcium 8.9 - 10.3 mg/dL 9.4  Total Protein 6.5 - 8.1 g/dL 7.3  Total Bilirubin 0.3 - 1.2 mg/dL 0.5  Alkaline Phos 38 - 126 U/L 87  AST 15 - 41 U/L 18  ALT 0 - 44 U/L 16   CBC Latest Ref Rng & Units 04/08/2021  WBC 4.0 - 10.5 K/uL 8.8  Hemoglobin 12.0 - 15.0 g/dL 9.3(L)  Hematocrit 36.0 - 46.0 % 29.9(L)  Platelets 150 - 400 K/uL 276    No images are attached to the encounter.  NM PET Image Restag (PS) Skull Base To Thigh  Result Date: 04/01/2021 CLINICAL DATA:  Subsequent treatment strategy for pancreatic cancer. EXAM: NUCLEAR MEDICINE PET SKULL BASE TO THIGH TECHNIQUE: 8.28 mCi F-18 FDG was injected intravenously. Full-ring PET imaging was performed from the skull base to thigh after the radiotracer. CT data was obtained and used for attenuation correction and anatomic localization. Fasting blood glucose: 157 mg/dl COMPARISON:  Prior PET-CT 12/11/2020 and prior MRI abdomen 11/05/2020 FINDINGS: Mediastinal blood pool activity: SUV max 3.14 Liver activity: SUV max NA NECK: No hypermetabolic lymph nodes in the neck. Incidental CT findings: none CHEST:  No hypermetabolic mediastinal or  hilar nodes. No suspicious pulmonary nodules on the CT scan. Incidental CT findings: Stable scattered vascular calcifications. 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 No acute pulmonary findings or worrisome pulmonary nodules. The right Port-A-Cath is stable. ABDOMEN/PELVIS: The pancreatic body mass appears relatively stable in size measuring approximately 4.9 x 2.3 cm. SUV max is 4.65 and was previously 5.13. The metastatic hepatic lesion is very difficult to measure on the CT scan. It measures approximately 10 mm. The SUV max is 3.65 and was previously 4.24. No new hepatic lesions are identified. No enlarged or hypermetabolic abdominal lymph nodes. Incidental CT findings: Diffuse fatty infiltration of the liver. Left renal calculus is stable. SKELETON: No findings suspicious for osseous metastatic disease. Incidental CT findings: none IMPRESSION: 1. The pancreatic body lesion is relatively stable in size and demonstrates a slight decrease in FDG uptake. 2. Stable sized metastatic hepatic lesion at the right hepatic dome. FDG uptake is slightly decreased since the prior study. 3. No new metastatic disease. Electronically Signed   By: Marijo Sanes M.D.   On: 04/01/2021 14:39     Assessment and plan- Patient is a 80 y.o. 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  She is here for on treatment assessment prior to cycle 3-day 1 of gemcitabine chemotherapy and discuss CT scan results and further management  Patient CA 19-9 has been trending up over the last 2 months.  However she had a big gap between modified FOLFIRINOX chemotherapy and start of gemcitabine due to decline in her performance status.  Repeat PET scan presently shows stable disease with stable pancreatic mass and isolated liver met with no evidence of progression.  She is tolerating present regimen of gemcitabine 2 weeks on and 1 week off well without any  significant side effects we discussed following options moving forward  Continue present single agent gemcitabine 2 weeks on and 1 week off with continued monitoring of CA 19-9 and repeat scans in 3 months Add Abraxane to gemcitabine regimen and see if she tolerates it Increase gemcitabine frequency to 3 weeks on and 1 week off which I am concerned patient may not be able to tolerate without side effects or cytopenias  Patient understands her options and is willing to consider gemcitabine and Abraxane 2 weeks on and 1 week off regimen.  I will be giving her Abraxane at the dose of 100 mg meter square.  Discussed risks and limits of Abraxane including all but not limited to nausea, vomiting, low blood counts and risk of peripheral neuropathy.  Patient understands and agrees to proceed as planned.  We will plan to add Abraxane starting next cycle.  I will see her back in 3 weeks for cycle 4-day 1 of gemcitabine and Abraxane   Visit Diagnosis 1. Pancreatic cancer metastasized to liver [facility])      Dr. Randa Evens, MD, MPH [facility] at [facility] [phone] 04/08/2021 12:54 PM

## 2021-04-11 ENCOUNTER — Other Ambulatory Visit: Payer: Self-pay | Admitting: *Deleted

## 2021-04-11 MED ORDER — HYDROCODONE-ACETAMINOPHEN 5-325 MG PO TABS: 1.0000 | ORAL_TABLET | Freq: Four times a day (QID) | ORAL | 0 refills | Status: DC | PRN

## 2021-04-15 ENCOUNTER — Inpatient Hospital Stay: Payer: Medicare PPO

## 2021-04-15 VITALS — BP 108/42 | HR 66 | Temp 97.0°F | Wt 155.1 lb

## 2021-04-15 DIAGNOSIS — C787 Secondary malignant neoplasm of liver and intrahepatic bile duct: Secondary | ICD-10-CM

## 2021-04-15 DIAGNOSIS — Z5111 Encounter for antineoplastic chemotherapy: Secondary | ICD-10-CM | POA: Diagnosis not present

## 2021-04-15 LAB — CBC WITH DIFFERENTIAL/PLATELET
Abs Immature Granulocytes: 0.02 10*3/uL (ref 0.00–0.07)
Basophils Absolute: 0 10*3/uL (ref 0.0–0.1)
Basophils Relative: 0 %
Eosinophils Absolute: 0 10*3/uL (ref 0.0–0.5)
Eosinophils Relative: 1 %
HCT: 27.1 % — ABNORMAL LOW (ref 36.0–46.0)
Hemoglobin: 8.5 g/dL — ABNORMAL LOW (ref 12.0–15.0)
Immature Granulocytes: 1 %
Lymphocytes Relative: 35 %
Lymphs Abs: 1.4 10*3/uL (ref 0.7–4.0)
MCH: 29.7 pg (ref 26.0–34.0)
MCHC: 31.4 g/dL (ref 30.0–36.0)
MCV: 94.8 fL (ref 80.0–100.0)
Monocytes Absolute: 0.5 10*3/uL (ref 0.1–1.0)
Monocytes Relative: 14 %
Neutro Abs: 2 10*3/uL (ref 1.7–7.7)
Neutrophils Relative %: 49 %
Platelets: 319 10*3/uL (ref 150–400)
RBC: 2.86 MIL/uL — ABNORMAL LOW (ref 3.87–5.11)
RDW: 16 % — ABNORMAL HIGH (ref 11.5–15.5)
WBC: 3.9 10*3/uL — ABNORMAL LOW (ref 4.0–10.5)
nRBC: 0 % (ref 0.0–0.2)

## 2021-04-15 LAB — COMPREHENSIVE METABOLIC PANEL
ALT: 27 U/L (ref 0–44)
AST: 20 U/L (ref 15–41)
Albumin: 3.7 g/dL (ref 3.5–5.0)
Alkaline Phosphatase: 102 U/L (ref 38–126)
Anion gap: 9 (ref 5–15)
BUN: 35 mg/dL — ABNORMAL HIGH (ref 8–23)
CO2: 23 mmol/L (ref 22–32)
Calcium: 9.4 mg/dL (ref 8.9–10.3)
Chloride: 104 mmol/L (ref 98–111)
Creatinine, Ser: 1.5 mg/dL — ABNORMAL HIGH (ref 0.44–1.00)
GFR, Estimated: 36 mL/min — ABNORMAL LOW (ref >60)
Glucose, Bld: 187 mg/dL — ABNORMAL HIGH (ref 70–99)
Potassium: 4.4 mmol/L (ref 3.5–5.1)
Sodium: 136 mmol/L (ref 135–145)
Total Bilirubin: 0.5 mg/dL (ref 0.3–1.2)
Total Protein: 7 g/dL (ref 6.5–8.1)

## 2021-04-15 MED ORDER — HEPARIN SOD (PORK) LOCK FLUSH 100 UNIT/ML IV SOLN
INTRAVENOUS | Status: AC
  Filled 2021-04-15: qty 5

## 2021-04-15 MED ORDER — SODIUM CHLORIDE 0.9 % IV SOLN
Freq: Once | INTRAVENOUS | Status: AC
  Filled 2021-04-15: qty 250

## 2021-04-15 MED ORDER — SODIUM CHLORIDE 0.9 % IV SOLN
1400.0000 mg | Freq: Once | INTRAVENOUS | Status: AC
  Administered 2021-04-15: 1400 mg via INTRAVENOUS
  Filled 2021-04-15: qty 10.52

## 2021-04-15 MED ORDER — HEPARIN SOD (PORK) LOCK FLUSH 100 UNIT/ML IV SOLN
500.0000 [IU] | Freq: Once | INTRAVENOUS | Status: AC
  Administered 2021-04-15: 500 [IU] via INTRAVENOUS
  Filled 2021-04-15: qty 5

## 2021-04-15 MED ORDER — HEPARIN SOD (PORK) LOCK FLUSH 100 UNIT/ML IV SOLN
500.0000 [IU] | Freq: Once | INTRAVENOUS | Status: DC | PRN
  Filled 2021-04-15: qty 5

## 2021-04-15 MED ORDER — PACLITAXEL PROTEIN-BOUND CHEMO INJECTION 100 MG
100.0000 mg/m2 | Freq: Once | INTRAVENOUS | Status: AC
  Administered 2021-04-15: 175 mg via INTRAVENOUS
  Filled 2021-04-15: qty 35

## 2021-04-15 MED ORDER — PROCHLORPERAZINE MALEATE 10 MG PO TABS
10.0000 mg | ORAL_TABLET | Freq: Once | ORAL | Status: AC
Start: 2021-04-15 — End: 2021-04-15
  Administered 2021-04-15: 10 mg via ORAL
  Filled 2021-04-15: qty 1

## 2021-04-15 MED ORDER — SODIUM CHLORIDE 0.9% FLUSH
10.0000 mL | Freq: Once | INTRAVENOUS | Status: AC
  Administered 2021-04-15: 10 mL via INTRAVENOUS
  Filled 2021-04-15: qty 10

## 2021-04-15 NOTE — Patient Instructions (Signed)
[facility]  Discharge Instructions: Thank you for choosing [facility] to provide your oncology and hematology care.  If you have a lab appointment with the [facility], please go directly to the [facility] and check in at the registration area.  Wear comfortable clothing and clothing appropriate for easy access to any Portacath or PICC line.   We strive to give you quality time with your provider. You may need to reschedule your appointment if you arrive late (15 or more minutes).  Arriving late affects you and other patients whose appointments are after yours.  Also, if you miss three or more appointments without notifying the office, you may be dismissed from the clinic at the provider's discretion.      For prescription refill requests, have your pharmacy contact our office and allow 72 hours for refills to be completed.    Today you received the following chemotherapy and/or immunotherapy agents Abraxane and Gemzar       To help prevent nausea and vomiting after your treatment, we encourage you to take your nausea medication as directed.  BELOW ARE SYMPTOMS THAT SHOULD BE REPORTED IMMEDIATELY: *FEVER GREATER THAN 100.4 F (38 C) OR HIGHER *CHILLS OR SWEATING *NAUSEA AND VOMITING THAT IS NOT CONTROLLED WITH YOUR NAUSEA MEDICATION *UNUSUAL SHORTNESS OF BREATH *UNUSUAL BRUISING OR BLEEDING *URINARY PROBLEMS (pain or burning when urinating, or frequent urination) *BOWEL PROBLEMS (unusual diarrhea, constipation, pain near the anus) TENDERNESS IN MOUTH AND THROAT WITH OR WITHOUT PRESENCE OF ULCERS (sore throat, sores in mouth, or a toothache) UNUSUAL RASH, SWELLING OR PAIN  UNUSUAL VAGINAL DISCHARGE OR ITCHING   Items with * indicate a potential emergency and should be followed up as soon as possible or go to the Emergency Department if any problems should occur.  Please show the CHEMOTHERAPY ALERT CARD or IMMUNOTHERAPY ALERT CARD at  check-in to the Emergency Department and triage nurse.  Should you have questions after your visit or need to cancel or reschedule your appointment, please contact [facility]  [phone] and follow the prompts.  Office hours are 8:00 a.m. to 4:30 p.m. Monday - Friday. Please note that voicemails left after 4:00 p.m. may not be returned until the following business day.  We are closed weekends and major holidays. You have access to a nurse at all times for urgent questions. Please call the main number to the clinic [phone] and follow the prompts.  For any non-urgent questions, you may also contact your provider using MyChart. We now offer e-Visits for anyone 72 and older to request care online for non-urgent symptoms. For details visit [email or web address].   Also download the MyChart app! Go to the app store, search "MyChart", open the app, select [facility], and log in with your MyChart username and password.  Due to Covid, a mask is required upon entering the hospital/clinic. If you do not have a mask, one will be given to you upon arrival. For doctor visits, patients may have 1 support person aged 47 or older with them. For treatment visits, patients cannot have anyone with them due to current Covid guidelines and our immunocompromised population.    Nanoparticle Albumin-Bound Paclitaxel injection What is this medication? NANOPARTICLE ALBUMIN-BOUND PACLITAXEL (Na no PAHR ti kuhl al BYOO muhn-bound PAK li TAX el) is a chemotherapy drug. It targets fast dividing cells, like cancer cells, and causes these cells to die. This medicine is used to treatadvanced breast cancer, lung cancer,  and pancreatic cancer. This medicine may be used for other purposes; ask your health care provider orpharmacist if you have questions. COMMON BRAND NAME(S): Abraxane What should I tell my care team before I take this medication? They need to know if you have any of these  conditions: kidney disease liver disease low blood counts, like low white cell, platelet, or red cell counts lung or breathing disease, like asthma tingling of the fingers or toes, or other nerve disorder an unusual or allergic reaction to paclitaxel, albumin, other chemotherapy, other medicines, foods, dyes, or preservatives pregnant or trying to get pregnant breast-feeding How should I use this medication? This drug is given as an infusion into a vein. It is administered in a hospitalor clinic by a specially trained health care professional. Talk to your pediatrician regarding the use of this medicine in children.Special care may be needed. Overdosage: If you think you have taken too much of this medicine contact apoison control center or emergency room at once. NOTE: This medicine is only for you. Do not share this medicine with others. What if I miss a dose? It is important not to miss your dose. Call your doctor or health careprofessional if you are unable to keep an appointment. What may interact with this medication? This medicine may interact with the following medications: antiviral medicines for hepatitis, HIV or AIDS certain antibiotics like erythromycin and clarithromycin certain medicines for fungal infections like ketoconazole and itraconazole certain medicines for seizures like carbamazepine, phenobarbital, phenytoin gemfibrozil nefazodone rifampin St. John's wort This list may not describe all possible interactions. Give your health care provider a list of all the medicines, herbs, non-prescription drugs, or dietary supplements you use. Also tell them if you smoke, drink alcohol, or use illegaldrugs. Some items may interact with your medicine. What should I watch for while using this medication? Your condition will be monitored carefully while you are receiving this medicine. You will need important blood work done while you are taking thismedicine. This medicine can  cause serious allergic reactions. If you experience allergic reactions like skin rash, itching or hives, swelling of the face, lips, ortongue, tell your doctor or health care professional right away. In some cases, you may be given additional medicines to help with side effects.Follow all directions for their use. This drug may make you feel generally unwell. This is not uncommon, as chemotherapy can affect healthy cells as well as cancer cells. Report any side effects. Continue your course of treatment even though you feel ill unless yourdoctor tells you to stop. Call your doctor or health care professional for advice if you get a fever, chills or sore throat, or other symptoms of a cold or flu. Do not treat yourself. This drug decreases your body's ability to fight infections. Try toavoid being around people who are sick. This medicine may increase your risk to bruise or bleed. Call your doctor orhealth care professional if you notice any unusual bleeding. Be careful brushing and flossing your teeth or using a toothpick because you may get an infection or bleed more easily. If you have any dental work done,tell your dentist you are receiving this medicine. Avoid taking products that contain aspirin, acetaminophen, ibuprofen, naproxen, or ketoprofen unless instructed by your doctor. These medicines may hide afever. Do not become pregnant while taking this medicine or for 6 months after stopping it. Women should inform their doctor if they wish to become pregnant or think they might be pregnant. Men should not father a child  while taking this medicine or for 3 months after stopping it. There is a potential for serious side effects to an unborn child. Talk to your health care professionalor pharmacist for more information. Do not breast-feed an infant while taking this medicine or for 2 weeks afterstopping it. This medicine may interfere with the ability to get pregnant or to father a child. You should talk  to your doctor or health care professional if you areconcerned about your fertility. What side effects may I notice from receiving this medication? Side effects that you should report to your doctor or health care professionalas soon as possible: allergic reactions like skin rash, itching or hives, swelling of the face, lips, or tongue breathing problems changes in vision fast, irregular heartbeat low blood pressure mouth sores pain, tingling, numbness in the hands or feet signs of decreased platelets or bleeding - bruising, pinpoint red spots on the skin, black, tarry stools, blood in the urine signs of decreased red blood cells - unusually weak or tired, feeling faint or lightheaded, falls signs of infection - fever or chills, cough, sore throat, pain or difficulty passing urine signs and symptoms of liver injury like dark yellow or brown urine; general ill feeling or flu-like symptoms; light-colored stools; loss of appetite; nausea; right upper belly pain; unusually weak or tired; yellowing of the eyes or skin swelling of the ankles, feet, hands unusually slow heartbeat Side effects that usually do not require medical attention (report to yourdoctor or health care professional if they continue or are bothersome): diarrhea hair loss loss of appetite nausea, vomiting tiredness This list may not describe all possible side effects. Call your doctor for medical advice about side effects. You may report side effects to FDA at1-800-FDA-1088. Where should I keep my medication? This drug is given in a hospital or clinic and will not be stored at home. NOTE: This sheet is a summary. It may not cover all possible information. If you have questions about this medicine, talk to your doctor, pharmacist, orhealth care provider.  2022 Elsevier/Gold Standard (2017-06-08 13:03:45)

## 2021-04-15 NOTE — Progress Notes (Signed)
Nutrition Follow-up:    Patient with metastatic pancreatic cancer with liver mets.  Planning gemcitabine and abraxane.   Met with patient during infusion.  Patient reports that appetite continues to be good. Eating good sources of protein.  Denies nutrition impact symptoms.      Medications: reviewed  Labs: reviewed  Anthropometrics:   Weight 155 lb 2 oz today  153 lb 4.8 oz on 5/31 148 lb 6.4 oz on 4/19  UBW 150s   NUTRITION DIAGNOSIS: Inadequate oral intake improved    INTERVENTION:  Continue eating well balanced diet including good sources of protein    MONITORING, EVALUATION, GOAL: weight trends, intake   NEXT VISIT: as needed  Jozelynn Danielson B. Zenia Resides, [place], [place] Registered Dietitian [phone] (mobile)

## 2021-04-16 LAB — CANCER ANTIGEN 19-9: CA 19-9: 651 U/mL — ABNORMAL HIGH (ref 0–35)

## 2021-04-17 ENCOUNTER — Other Ambulatory Visit: Payer: Self-pay

## 2021-04-17 ENCOUNTER — Inpatient Hospital Stay (HOSPITAL_BASED_OUTPATIENT_CLINIC_OR_DEPARTMENT_OTHER): Payer: Medicare PPO | Admitting: Hospice and Palliative Medicine

## 2021-04-17 DIAGNOSIS — C787 Secondary malignant neoplasm of liver and intrahepatic bile duct: Secondary | ICD-10-CM | POA: Diagnosis not present

## 2021-04-17 DIAGNOSIS — C259 Malignant neoplasm of pancreas, unspecified: Secondary | ICD-10-CM | POA: Diagnosis not present

## 2021-04-17 DIAGNOSIS — Z515 Encounter for palliative care: Secondary | ICD-10-CM | POA: Diagnosis not present

## 2021-04-17 DIAGNOSIS — G893 Neoplasm related pain (acute) (chronic): Secondary | ICD-10-CM | POA: Diagnosis not present

## 2021-04-17 NOTE — Progress Notes (Signed)
Virtual Visit via Telephone Note  I connected with Rhonda Day on 04/17/21 at 10:30 AM EDT by telephone and verified that I am speaking with the correct person using two identifiers.  Location: Patient: home Provider: clinic   I discussed the limitations, risks, security and privacy concerns of performing an evaluation and management service by telephone and the availability of in person appointments. I also discussed with the patient that there may be a patient responsible charge related to this service. The patient expressed understanding and agreed to proceed.   History of Present Illness: Rhonda Day is a 80 y.o. 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  Patient was found to have a pancreatic tail mass in December 2021.  She ultimately underwent EUS and biopsy with pathology consistent for adenocarcinoma.  Patient is referred to palliative care to help address goals and manage ongoing symptoms.   Observations/Objective: Spoke with patient by phone.  She reports worse generalized pain over the past several days, which has been worse at night and keeping her awake.  Patient has been taking the Norco about every 4 hours and does find it helpful.  She says she has not been taking the MS Contin.  Discussed the differences between long-acting and short acting pain medications.  I suggested that she resume the MS Contin and she can continue taking the Norco as needed for breakthrough pain.  Patient denies any issues with constipation.  She reports appetite is good.  No changes in performance status or other distressing symptoms.  Assessment and Plan: Stage IV pancreatic cancer -on systemic chemotherapy.   Neoplasm related pain -recommended restarting MS Contin and continuing Norco as needed for breakthrough pain  Follow Up Instructions: Follow-up MyChart visit 1 month   I discussed the  assessment and treatment plan with the patient. The patient was provided an opportunity to ask questions and all were answered. The patient agreed with the plan and demonstrated an understanding of the instructions.   The patient was advised to call back or seek an in-person evaluation if the symptoms worsen or if the condition fails to improve as anticipated.  I provided 10 minutes of non-face-to-face time during this encounter.   Irean Hong, NP

## 2021-04-22 ENCOUNTER — Inpatient Hospital Stay: Payer: Medicare PPO | Admitting: Oncology

## 2021-04-22 ENCOUNTER — Inpatient Hospital Stay: Payer: Medicare PPO | Attending: Oncology

## 2021-04-22 ENCOUNTER — Encounter: Payer: Self-pay | Admitting: Oncology

## 2021-04-22 VITALS — BP 120/67 | HR 80 | Temp 97.0°F | Resp 20 | Wt 155.2 lb

## 2021-04-22 DIAGNOSIS — E1122 Type 2 diabetes mellitus with diabetic chronic kidney disease: Secondary | ICD-10-CM | POA: Insufficient documentation

## 2021-04-22 DIAGNOSIS — I480 Paroxysmal atrial fibrillation: Secondary | ICD-10-CM | POA: Diagnosis not present

## 2021-04-22 DIAGNOSIS — E538 Deficiency of other specified B group vitamins: Secondary | ICD-10-CM | POA: Insufficient documentation

## 2021-04-22 DIAGNOSIS — Z87891 Personal history of nicotine dependence: Secondary | ICD-10-CM | POA: Insufficient documentation

## 2021-04-22 DIAGNOSIS — Z5111 Encounter for antineoplastic chemotherapy: Secondary | ICD-10-CM | POA: Insufficient documentation

## 2021-04-22 DIAGNOSIS — C787 Secondary malignant neoplasm of liver and intrahepatic bile duct: Secondary | ICD-10-CM | POA: Diagnosis present

## 2021-04-22 DIAGNOSIS — C258 Malignant neoplasm of overlapping sites of pancreas: Secondary | ICD-10-CM | POA: Diagnosis present

## 2021-04-22 DIAGNOSIS — Z79899 Other long term (current) drug therapy: Secondary | ICD-10-CM | POA: Insufficient documentation

## 2021-04-22 DIAGNOSIS — T451X5A Adverse effect of antineoplastic and immunosuppressive drugs, initial encounter: Secondary | ICD-10-CM | POA: Diagnosis not present

## 2021-04-22 DIAGNOSIS — D6481 Anemia due to antineoplastic chemotherapy: Secondary | ICD-10-CM | POA: Diagnosis not present

## 2021-04-22 DIAGNOSIS — G893 Neoplasm related pain (acute) (chronic): Secondary | ICD-10-CM | POA: Diagnosis not present

## 2021-04-22 DIAGNOSIS — F419 Anxiety disorder, unspecified: Secondary | ICD-10-CM | POA: Diagnosis not present

## 2021-04-22 DIAGNOSIS — Z7984 Long term (current) use of oral hypoglycemic drugs: Secondary | ICD-10-CM | POA: Insufficient documentation

## 2021-04-22 DIAGNOSIS — N183 Chronic kidney disease, stage 3 unspecified: Secondary | ICD-10-CM | POA: Insufficient documentation

## 2021-04-22 DIAGNOSIS — C259 Malignant neoplasm of pancreas, unspecified: Secondary | ICD-10-CM

## 2021-04-22 LAB — CBC WITH DIFFERENTIAL/PLATELET
Abs Immature Granulocytes: 0.03 10*3/uL (ref 0.00–0.07)
Basophils Absolute: 0 10*3/uL (ref 0.0–0.1)
Basophils Relative: 0 %
Eosinophils Absolute: 0 10*3/uL (ref 0.0–0.5)
Eosinophils Relative: 1 %
HCT: 27.7 % — ABNORMAL LOW (ref 36.0–46.0)
Hemoglobin: 8.5 g/dL — ABNORMAL LOW (ref 12.0–15.0)
Immature Granulocytes: 1 %
Lymphocytes Relative: 44 %
Lymphs Abs: 1.9 10*3/uL (ref 0.7–4.0)
MCH: 29.2 pg (ref 26.0–34.0)
MCHC: 30.7 g/dL (ref 30.0–36.0)
MCV: 95.2 fL (ref 80.0–100.0)
Monocytes Absolute: 0.5 10*3/uL (ref 0.1–1.0)
Monocytes Relative: 12 %
Neutro Abs: 1.8 10*3/uL (ref 1.7–7.7)
Neutrophils Relative %: 42 %
Platelets: 223 10*3/uL (ref 150–400)
RBC: 2.91 MIL/uL — ABNORMAL LOW (ref 3.87–5.11)
RDW: 15.9 % — ABNORMAL HIGH (ref 11.5–15.5)
WBC: 4.2 10*3/uL (ref 4.0–10.5)
nRBC: 0 % (ref 0.0–0.2)

## 2021-04-22 LAB — COMPREHENSIVE METABOLIC PANEL
ALT: 32 U/L (ref 0–44)
AST: 24 U/L (ref 15–41)
Albumin: 3.8 g/dL (ref 3.5–5.0)
Alkaline Phosphatase: 106 U/L (ref 38–126)
Anion gap: 9 (ref 5–15)
BUN: 32 mg/dL — ABNORMAL HIGH (ref 8–23)
CO2: 25 mmol/L (ref 22–32)
Calcium: 9.4 mg/dL (ref 8.9–10.3)
Chloride: 105 mmol/L (ref 98–111)
Creatinine, Ser: 1.54 mg/dL — ABNORMAL HIGH (ref 0.44–1.00)
GFR, Estimated: 35 mL/min — ABNORMAL LOW (ref >60)
Glucose, Bld: 82 mg/dL (ref 70–99)
Potassium: 4.1 mmol/L (ref 3.5–5.1)
Sodium: 139 mmol/L (ref 135–145)
Total Bilirubin: 0.4 mg/dL (ref 0.3–1.2)
Total Protein: 7.3 g/dL (ref 6.5–8.1)

## 2021-04-22 MED ORDER — HEPARIN SOD (PORK) LOCK FLUSH 100 UNIT/ML IV SOLN
INTRAVENOUS | Status: AC
  Filled 2021-04-22: qty 5

## 2021-04-22 MED ORDER — HEPARIN SOD (PORK) LOCK FLUSH 100 UNIT/ML IV SOLN
500.0000 [IU] | Freq: Once | INTRAVENOUS | Status: AC
  Administered 2021-04-22: 500 [IU] via INTRAVENOUS
  Filled 2021-04-22: qty 5

## 2021-04-22 NOTE — Progress Notes (Signed)
Hematology/Oncology Consult note [facility]  Telephone:([phone] [phone]  Patient Care Team: Dion Body, MD as PCP - General (Family Medicine) Clent Jacks, RN as Oncology Nurse Navigator   Name of the patient: Rhonda Day  [id number]  05/24/1941   Date of visit: 04/22/21  Diagnosis- metastatic pancreatic cancer with liver metastases  Chief complaint/ Reason for visit-on treatment assessment prior to cycle 4-day 15 of gemcitabine Abraxane chemotherapy  Heme/Onc history: Patient is a 80 year old female who underwent ultrasound abdomen in December 2021 which showed hypoechoic mass in the pancreatic tail measuring 2.1 x 1.8 x 4.5 cm which was not seen on prior CT scans on MRI exams a year ago.  This was followed by a CT abdomen and pelvis with contrast which showed ill-defined hypervascular lesion in segment 8 of liver measuring 1.6 x 1.4 cm concerning for metastases.  2 other lesions 1.7 x 1 cm and 1.5 x 0.8 cm were also noted.  Large mass in the body of pancreas measuring 5.6 x 2.2 x 2.9 cm.  The lesion causes obstruction of the pancreatic duct resulting in ductal dilatation throughout the tail of the pancreas.  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  Splenic vein appears chronically occluded.  Lesion also comes in contact with splenic artery which remains patent.  This was followed by MRI abdomen which again showed similar findings.  Multiple foci of restricted diffusion with hyperenhancement noted in the right hepatic lobe.  Prominent portocaval lymph node seen for local nodal metastases along with a 5.3 cm body and tail pancreatic mass   Case was discussed at tumor board and lab was liver biopsy 2 weeks after patient initial Covid test that was positive.  However at the day of the biopsy this lesion was in close association with  other vascular structures and given the potential higher risks liver biopsy was aborted and EUS was recommended.  She is currently on Cardizem.     Patient had a EUS at [facility] By Dr. Mont Dutton. Pathology was consistent with adenocarcinoma. Patient had a PET scan on 12/11/2020 which showed a hypermetabolic lesion in the medial aspect of the liver with an SUV of 4.2 there is a subtle hypodensity on the comparison CT measuring 9 mm at this level. The hypermetabolic lesion in the dome of the liver corresponds to the enhancing lesion on the comparison MRI. 3.4 x 2.7 cm lesion in the mid body of pancreas with an SUV of 5.1. No hypermetabolic peripancreatic lymph nodes.    Patient received 1 cycle of modified FOLFIRINOX chemotherapy on 12/30/2020.  Following that patient had significant nausea vomiting fatigue and declining performance status requiring chemotherapy to be put on hold.  Patient switched to single agent gemcitabine chemotherapy 2 weeks on 1 week off    Interval history-patient reports ongoing fatigue which is relatively at her baseline.  She has right hip pain which is well controlled with hydrocodone.  Bowel movements are regular.  ECOG PS- 1 Pain scale- 3 Opioid associated constipation- no  Review of systems- Review of Systems  Constitutional:  Positive for malaise/fatigue. Negative for chills, fever and weight loss.  HENT:  Negative for congestion, ear discharge and nosebleeds.   Eyes:  Negative for blurred vision.  Respiratory:  Negative for cough, hemoptysis, sputum production, shortness of breath and wheezing.   Cardiovascular:  Negative for chest pain, palpitations,  orthopnea and claudication.  Gastrointestinal:  Negative for abdominal pain, blood in stool, constipation, diarrhea, heartburn, melena, nausea and vomiting.  Genitourinary:  Negative for dysuria, flank pain, frequency, hematuria and urgency.  Musculoskeletal:  Negative for back pain, joint pain and myalgias.  Skin:   Negative for rash.  Neurological:  Negative for dizziness, tingling, focal weakness, seizures, weakness and headaches.  Endo/Heme/Allergies:  Does not bruise/bleed easily.  Psychiatric/Behavioral:  Negative for depression and suicidal ideas. The patient does not have insomnia.       Allergies  Allergen Reactions   Sulfa Antibiotics Rash    Other reaction(s): RASH Other reaction(s): RASH      Past Medical History:  Diagnosis Date   Bladder infection    Diabetes mellitus without complication ([place])    Family history of kidney cancer    Family history of lung cancer    Family history of stomach cancer    Family history of throat cancer    Hypertension    Pancreatic cancer ([place])    with liver mass     Past Surgical History:  Procedure Laterality Date   BACK SURGERY     PORTA CATH INSERTION N/A 12/27/2020   Procedure: PORTA CATH INSERTION;  Surgeon: Algernon Huxley, MD;  Location: [facility];  Service: Cardiovascular;  Laterality: N/A;   TUBAL LIGATION      Social History   Socioeconomic History   Marital status: Married    Spouse name: Not on file   Number of children: Not on file   Years of education: Not on file   Highest education level: Not on file  Occupational History   Not on file  Tobacco Use   Smoking status: Former    Pack years: 0.00    Types: Cigarettes    Quit date: 11/20/1979    Years since quitting: 41.4   Smokeless tobacco: Never  Vaping Use   Vaping Use: Never used  Substance and Sexual Activity   Alcohol use: No    Alcohol/week: 0.0 standard drinks   Drug use: No   Sexual activity: Never  Other Topics Concern   Not on file  Social History Narrative   Not on file   Social Determinants of Health   Financial Resource Strain: Not on file  Food Insecurity: Not on file  Transportation Needs: Not on file  Physical Activity: Not on file  Stress: Not on file  Social Connections: Not on file  Intimate Partner Violence: Not on file     Family History  Problem Relation Age of Onset   Kidney cancer Father 4   COPD Mother    Stomach cancer Maternal Aunt    Throat cancer Maternal Uncle    Liver cancer Paternal Uncle    Esophageal cancer Paternal Grandfather    Lung cancer Maternal Aunt    Lung cancer Maternal Uncle    Lung cancer Cousin    Kidney cancer Cousin 52   Testicular cancer Nephew        dx 20s   Breast cancer Neg Hx      Current Outpatient Medications:    azelastine (ASTELIN) 0.1 % nasal spray, Place into both nostrils daily as needed., Disp: , Rfl:    azelastine (OPTIVAR) 0.05 % ophthalmic solution, Place into both eyes daily as needed., Disp: , Rfl: 3   diltiazem (TIAZAC) 180 MG 24 hr capsule, Take by mouth daily., Disp: , Rfl:    glimepiride (AMARYL) 2 MG tablet, Take 2 mg  by mouth daily with breakfast., Disp: , Rfl:    HYDROcodone-acetaminophen (NORCO) 5-325 MG tablet, Take 1 tablet by mouth every 6 (six) hours as needed for moderate pain., Disp: 60 tablet, Rfl: 0   LORazepam (ATIVAN) 0.5 MG tablet, TAKE 1 TABLET(0.5 MG) BY MOUTH EVERY 8 HOURS, Disp: 60 tablet, Rfl: 0   losartan (COZAAR) 100 MG tablet, Take 100 mg by mouth daily., Disp: , Rfl:    metFORMIN (GLUCOPHAGE-XR) 500 MG 24 hr tablet, Take 500 mg by mouth 2 (two) times daily. 2 tabs twice a day, Disp: , Rfl: 0   morphine (MS CONTIN) 15 MG 12 hr tablet, Take 1 tablet (15 mg total) by mouth every 12 (twelve) hours., Disp: 60 tablet, Rfl: 0   ONE TOUCH ULTRA TEST test strip, , Disp: , Rfl: 1   ONETOUCH DELICA LANCETS 21J MISC, 1 each by XX route as directed. Check CBG's fasting once daily. Dx: E11.9, Disp: , Rfl:    traZODone (DESYREL) 50 MG tablet, Take 0.5-1 tablets (25-50 mg total) by mouth at bedtime as needed for sleep., Disp: 30 tablet, Rfl: 3   ferrous sulfate 325 (65 FE) MG EC tablet, Take by mouth. (Patient not taking: No sig reported), Disp: , Rfl:    gabapentin (NEURONTIN) 100 MG capsule, Take 1 capsule (100 mg total) by mouth at  bedtime. (Patient not taking: No sig reported), Disp: 30 capsule, Rfl: 2 No current facility-administered medications for this visit.  Facility-Administered Medications Ordered in Other Visits:    sodium chloride flush (NS) 0.9 % injection 10 mL, 10 mL, Intravenous, PRN, Sindy Guadeloupe, MD, 10 mL at 03/04/21 1045  Physical exam:  Vitals:   04/22/21 1006  BP: 120/67  Pulse: 80  Resp: 20  Temp: (!) 97 F (36.1 C)  TempSrc: Tympanic  SpO2: 97%  Weight: 155 lb 3.2 oz (70.4 kg)   Physical Exam Constitutional:      Comments: Appears fatigued  Cardiovascular:     Rate and Rhythm: Normal rate and regular rhythm.     Heart sounds: Normal heart sounds.  Pulmonary:     Effort: Pulmonary effort is normal.     Breath sounds: Normal breath sounds.  Abdominal:     General: Bowel sounds are normal.     Palpations: Abdomen is soft.  Skin:    General: Skin is warm and dry.  Neurological:     Mental Status: She is alert and oriented to person, place, and time.     CMP Latest Ref Rng & Units 04/22/2021  Glucose 70 - 99 mg/dL 82  BUN 8 - 23 mg/dL 32(H)  Creatinine 0.44 - 1.00 mg/dL 1.54(H)  Sodium 135 - 145 mmol/L 139  Potassium 3.5 - 5.1 mmol/L 4.1  Chloride 98 - 111 mmol/L 105  CO2 22 - 32 mmol/L 25  Calcium 8.9 - 10.3 mg/dL 9.4  Total Protein 6.5 - 8.1 g/dL 7.3  Total Bilirubin 0.3 - 1.2 mg/dL 0.4  Alkaline Phos 38 - 126 U/L 106  AST 15 - 41 U/L 24  ALT 0 - 44 U/L 32   CBC Latest Ref Rng & Units 04/22/2021  WBC 4.0 - 10.5 K/uL 4.2  Hemoglobin 12.0 - 15.0 g/dL 8.5(L)  Hematocrit 36.0 - 46.0 % 27.7(L)  Platelets 150 - 400 K/uL 223    No images are attached to the encounter.  NM PET Image Restag (PS) Skull Base To Thigh  Result Date: 04/01/2021 CLINICAL DATA:  Subsequent treatment strategy for pancreatic cancer.  EXAM: NUCLEAR MEDICINE PET SKULL BASE TO THIGH TECHNIQUE: 8.28 mCi F-18 FDG was injected intravenously. Full-ring PET imaging was performed from the skull base to thigh  after the radiotracer. CT data was obtained and used for attenuation correction and anatomic localization. Fasting blood glucose: 157 mg/dl COMPARISON:  Prior PET-CT 12/11/2020 and prior MRI abdomen 11/05/2020 FINDINGS: Mediastinal blood pool activity: SUV max 3.14 Liver activity: SUV max NA NECK: No hypermetabolic lymph nodes in the neck. Incidental CT findings: none CHEST: No hypermetabolic mediastinal or hilar nodes. No suspicious pulmonary nodules on the CT scan. Incidental CT findings: Stable scattered vascular calcifications. 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 No acute pulmonary findings or worrisome pulmonary nodules. The right Port-A-Cath is stable. ABDOMEN/PELVIS: The pancreatic body mass appears relatively stable in size measuring approximately 4.9 x 2.3 cm. SUV max is 4.65 and was previously 5.13. The metastatic hepatic lesion is very difficult to measure on the CT scan. It measures approximately 10 mm. The SUV max is 3.65 and was previously 4.24. No new hepatic lesions are identified. No enlarged or hypermetabolic abdominal lymph nodes. Incidental CT findings: Diffuse fatty infiltration of the liver. Left renal calculus is stable. SKELETON: No findings suspicious for osseous metastatic disease. Incidental CT findings: none IMPRESSION: 1. The pancreatic body lesion is relatively stable in size and demonstrates a slight decrease in FDG uptake. 2. Stable sized metastatic hepatic lesion at the right hepatic dome. FDG uptake is slightly decreased since the prior study. 3. No new metastatic disease. Electronically Signed   By: Marijo Sanes M.D.   On: 04/01/2021 14:39     Assessment and plan- Patient is a 80 y.o. 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  She is here for on treatment assessment prior to cycle 4-day 15 of gemcitabine Abraxane chemotherapy  Patient received gemcitabine alone on 04/08/2021 and gemcitabine Abraxane on  04/15/2021.  Her hemoglobin is down to 8.5 today.  I am concerned if she would be able to tolerate 3 weeks on 1 week off regimen.  I will therefore hold off on giving her chemotherapy today.  She will return to clinic in 1 week with labs CBC with differential, CMP and receive cycle 5-day 1 of gemcitabine Abraxane regimen.  Plan will be chemotherapy 2 weeks on 1 week off until progression or toxicity.  Chemo induced anemia: Patient did have ferritin iron studies and B12 levels checked 2 months ago which were within normal limits.  Her anemia is likely chemo induced.  Continue to monitor.  Will consider further dose reduction and Abraxane if anemia fails to improve  Labs and chemotherapy in 1 week in 2 weeks and I will see her back in 2 weeks   Visit Diagnosis 1. Pancreatic cancer metastasized to liver ([place])   2. Antineoplastic chemotherapy induced anemia   3. Encounter for antineoplastic chemotherapy      Dr. Randa Evens, MD, MPH [facility] at [facility] [phone] 04/22/2021 1:11 PM

## 2021-04-23 ENCOUNTER — Inpatient Hospital Stay: Payer: Medicare PPO

## 2021-04-24 ENCOUNTER — Telehealth: Payer: Medicare PPO | Admitting: Hospice and Palliative Medicine

## 2021-04-29 ENCOUNTER — Inpatient Hospital Stay: Payer: Medicare PPO

## 2021-04-29 ENCOUNTER — Ambulatory Visit: Payer: Medicare PPO

## 2021-04-29 ENCOUNTER — Other Ambulatory Visit: Payer: Medicare PPO

## 2021-04-29 ENCOUNTER — Other Ambulatory Visit: Payer: Self-pay

## 2021-04-29 ENCOUNTER — Other Ambulatory Visit: Payer: Self-pay | Admitting: Oncology

## 2021-04-29 VITALS — BP 130/58 | HR 90 | Temp 96.0°F | Resp 20 | Wt 155.4 lb

## 2021-04-29 DIAGNOSIS — C259 Malignant neoplasm of pancreas, unspecified: Secondary | ICD-10-CM

## 2021-04-29 DIAGNOSIS — Z5111 Encounter for antineoplastic chemotherapy: Secondary | ICD-10-CM | POA: Diagnosis not present

## 2021-04-29 DIAGNOSIS — C787 Secondary malignant neoplasm of liver and intrahepatic bile duct: Secondary | ICD-10-CM

## 2021-04-29 LAB — COMPREHENSIVE METABOLIC PANEL
ALT: 28 U/L (ref 0–44)
AST: 31 U/L (ref 15–41)
Albumin: 3.9 g/dL (ref 3.5–5.0)
Alkaline Phosphatase: 114 U/L (ref 38–126)
Anion gap: 9 (ref 5–15)
BUN: 37 mg/dL — ABNORMAL HIGH (ref 8–23)
CO2: 23 mmol/L (ref 22–32)
Calcium: 9.5 mg/dL (ref 8.9–10.3)
Chloride: 105 mmol/L (ref 98–111)
Creatinine, Ser: 1.44 mg/dL — ABNORMAL HIGH (ref 0.44–1.00)
GFR, Estimated: 37 mL/min — ABNORMAL LOW (ref >60)
Glucose, Bld: 147 mg/dL — ABNORMAL HIGH (ref 70–99)
Potassium: 4.4 mmol/L (ref 3.5–5.1)
Sodium: 137 mmol/L (ref 135–145)
Total Bilirubin: 0.1 mg/dL — ABNORMAL LOW (ref 0.3–1.2)
Total Protein: 7.7 g/dL (ref 6.5–8.1)

## 2021-04-29 LAB — CBC WITH DIFFERENTIAL/PLATELET
Abs Immature Granulocytes: 0.12 10*3/uL — ABNORMAL HIGH (ref 0.00–0.07)
Basophils Absolute: 0 10*3/uL (ref 0.0–0.1)
Basophils Relative: 0 %
Eosinophils Absolute: 0.2 10*3/uL (ref 0.0–0.5)
Eosinophils Relative: 2 %
HCT: 29.6 % — ABNORMAL LOW (ref 36.0–46.0)
Hemoglobin: 9.3 g/dL — ABNORMAL LOW (ref 12.0–15.0)
Immature Granulocytes: 1 %
Lymphocytes Relative: 20 %
Lymphs Abs: 1.9 10*3/uL (ref 0.7–4.0)
MCH: 29.9 pg (ref 26.0–34.0)
MCHC: 31.4 g/dL (ref 30.0–36.0)
MCV: 95.2 fL (ref 80.0–100.0)
Monocytes Absolute: 1.2 10*3/uL — ABNORMAL HIGH (ref 0.1–1.0)
Monocytes Relative: 12 %
Neutro Abs: 6.1 10*3/uL (ref 1.7–7.7)
Neutrophils Relative %: 65 %
Platelets: 334 10*3/uL (ref 150–400)
RBC: 3.11 MIL/uL — ABNORMAL LOW (ref 3.87–5.11)
RDW: 16.1 % — ABNORMAL HIGH (ref 11.5–15.5)
WBC: 9.5 10*3/uL (ref 4.0–10.5)
nRBC: 0 % (ref 0.0–0.2)

## 2021-04-29 MED ORDER — SODIUM CHLORIDE 0.9 % IV SOLN
Freq: Once | INTRAVENOUS | Status: AC
  Filled 2021-04-29: qty 250

## 2021-04-29 MED ORDER — HEPARIN SOD (PORK) LOCK FLUSH 100 UNIT/ML IV SOLN
INTRAVENOUS | Status: AC
  Filled 2021-04-29: qty 5

## 2021-04-29 MED ORDER — PACLITAXEL PROTEIN-BOUND CHEMO INJECTION 100 MG
100.0000 mg/m2 | Freq: Once | INTRAVENOUS | Status: AC
  Administered 2021-04-29: 175 mg via INTRAVENOUS
  Filled 2021-04-29: qty 35

## 2021-04-29 MED ORDER — SODIUM CHLORIDE 0.9 % IV SOLN
1400.0000 mg | Freq: Once | INTRAVENOUS | Status: AC
  Administered 2021-04-29: 1400 mg via INTRAVENOUS
  Filled 2021-04-29: qty 26.3

## 2021-04-29 MED ORDER — PROCHLORPERAZINE MALEATE 10 MG PO TABS
10.0000 mg | ORAL_TABLET | Freq: Once | ORAL | Status: AC
Start: 2021-04-29 — End: 2021-04-29
  Administered 2021-04-29: 10 mg via ORAL
  Filled 2021-04-29: qty 1

## 2021-04-29 MED ORDER — HEPARIN SOD (PORK) LOCK FLUSH 100 UNIT/ML IV SOLN
500.0000 [IU] | Freq: Once | INTRAVENOUS | Status: AC | PRN
  Administered 2021-04-29: 500 [IU]
  Filled 2021-04-29: qty 5

## 2021-04-29 NOTE — Patient Instructions (Signed)
[facility]  Discharge Instructions: Thank you for choosing [facility] to provide your oncology and hematology care.  If you have a lab appointment with the [facility], please go directly to the [facility] and check in at the registration area.  Wear comfortable clothing and clothing appropriate for easy access to any Portacath or PICC line.   We strive to give you quality time with your provider. You may need to reschedule your appointment if you arrive late (15 or more minutes).  Arriving late affects you and other patients whose appointments are after yours.  Also, if you miss three or more appointments without notifying the office, you may be dismissed from the clinic at the provider's discretion.      For prescription refill requests, have your pharmacy contact our office and allow 72 hours for refills to be completed.    Today you received the following chemotherapy and/or immunotherapy agents Abraxane & Gemzar   To help prevent nausea and vomiting after your treatment, we encourage you to take your nausea medication as directed.  BELOW ARE SYMPTOMS THAT SHOULD BE REPORTED IMMEDIATELY: *FEVER GREATER THAN 100.4 F (38 C) OR HIGHER *CHILLS OR SWEATING *NAUSEA AND VOMITING THAT IS NOT CONTROLLED WITH YOUR NAUSEA MEDICATION *UNUSUAL SHORTNESS OF BREATH *UNUSUAL BRUISING OR BLEEDING *URINARY PROBLEMS (pain or burning when urinating, or frequent urination) *BOWEL PROBLEMS (unusual diarrhea, constipation, pain near the anus) TENDERNESS IN MOUTH AND THROAT WITH OR WITHOUT PRESENCE OF ULCERS (sore throat, sores in mouth, or a toothache) UNUSUAL RASH, SWELLING OR PAIN  UNUSUAL VAGINAL DISCHARGE OR ITCHING   Items with * indicate a potential emergency and should be followed up as soon as possible or go to the Emergency Department if any problems should occur.  Please show the CHEMOTHERAPY ALERT CARD or IMMUNOTHERAPY ALERT CARD at  check-in to the Emergency Department and triage nurse.  Should you have questions after your visit or need to cancel or reschedule your appointment, please contact [facility]  [phone] and follow the prompts.  Office hours are 8:00 a.m. to 4:30 p.m. Monday - Friday. Please note that voicemails left after 4:00 p.m. may not be returned until the following business day.  We are closed weekends and major holidays. You have access to a nurse at all times for urgent questions. Please call the main number to the clinic [phone] and follow the prompts.  For any non-urgent questions, you may also contact your provider using MyChart. We now offer e-Visits for anyone 78 and older to request care online for non-urgent symptoms. For details visit [email or web address].   Also download the MyChart app! Go to the app store, search "MyChart", open the app, select [facility], and log in with your MyChart username and password.  Due to Covid, a mask is required upon entering the hospital/clinic. If you do not have a mask, one will be given to you upon arrival. For doctor visits, patients may have 1 support person aged 13 or older with them. For treatment visits, patients cannot have anyone with them due to current Covid guidelines and our immunocompromised population.

## 2021-04-30 LAB — CANCER ANTIGEN 19-9: CA 19-9: 882 U/mL — ABNORMAL HIGH (ref 0–35)

## 2021-05-06 ENCOUNTER — Other Ambulatory Visit: Payer: Self-pay | Admitting: *Deleted

## 2021-05-06 ENCOUNTER — Inpatient Hospital Stay: Payer: Medicare PPO

## 2021-05-06 ENCOUNTER — Other Ambulatory Visit: Payer: Self-pay

## 2021-05-06 ENCOUNTER — Encounter: Payer: Self-pay | Admitting: Oncology

## 2021-05-06 ENCOUNTER — Inpatient Hospital Stay: Payer: Medicare PPO | Admitting: Oncology

## 2021-05-06 VITALS — BP 126/61 | HR 80 | Wt 151.9 lb

## 2021-05-06 DIAGNOSIS — C787 Secondary malignant neoplasm of liver and intrahepatic bile duct: Secondary | ICD-10-CM

## 2021-05-06 DIAGNOSIS — F419 Anxiety disorder, unspecified: Secondary | ICD-10-CM | POA: Diagnosis not present

## 2021-05-06 DIAGNOSIS — C259 Malignant neoplasm of pancreas, unspecified: Secondary | ICD-10-CM

## 2021-05-06 DIAGNOSIS — T451X5A Adverse effect of antineoplastic and immunosuppressive drugs, initial encounter: Secondary | ICD-10-CM

## 2021-05-06 DIAGNOSIS — Z5111 Encounter for antineoplastic chemotherapy: Secondary | ICD-10-CM | POA: Diagnosis not present

## 2021-05-06 DIAGNOSIS — Z95828 Presence of other vascular implants and grafts: Secondary | ICD-10-CM

## 2021-05-06 DIAGNOSIS — F1190 Opioid use, unspecified, uncomplicated: Secondary | ICD-10-CM

## 2021-05-06 DIAGNOSIS — D6481 Anemia due to antineoplastic chemotherapy: Secondary | ICD-10-CM | POA: Diagnosis not present

## 2021-05-06 LAB — COMPREHENSIVE METABOLIC PANEL
ALT: 30 U/L (ref 0–44)
AST: 20 U/L (ref 15–41)
Albumin: 3.9 g/dL (ref 3.5–5.0)
Alkaline Phosphatase: 125 U/L (ref 38–126)
Anion gap: 10 (ref 5–15)
BUN: 36 mg/dL — ABNORMAL HIGH (ref 8–23)
CO2: 22 mmol/L (ref 22–32)
Calcium: 9.7 mg/dL (ref 8.9–10.3)
Chloride: 105 mmol/L (ref 98–111)
Creatinine, Ser: 1.46 mg/dL — ABNORMAL HIGH (ref 0.44–1.00)
GFR, Estimated: 37 mL/min — ABNORMAL LOW (ref >60)
Glucose, Bld: 129 mg/dL — ABNORMAL HIGH (ref 70–99)
Potassium: 4.4 mmol/L (ref 3.5–5.1)
Sodium: 137 mmol/L (ref 135–145)
Total Bilirubin: 0.5 mg/dL (ref 0.3–1.2)
Total Protein: 7.3 g/dL (ref 6.5–8.1)

## 2021-05-06 LAB — CBC WITH DIFFERENTIAL/PLATELET
Abs Immature Granulocytes: 0.03 10*3/uL (ref 0.00–0.07)
Basophils Absolute: 0 10*3/uL (ref 0.0–0.1)
Basophils Relative: 0 %
Eosinophils Absolute: 0.1 10*3/uL (ref 0.0–0.5)
Eosinophils Relative: 1 %
HCT: 28.9 % — ABNORMAL LOW (ref 36.0–46.0)
Hemoglobin: 8.8 g/dL — ABNORMAL LOW (ref 12.0–15.0)
Immature Granulocytes: 1 %
Lymphocytes Relative: 35 %
Lymphs Abs: 2.3 10*3/uL (ref 0.7–4.0)
MCH: 29.2 pg (ref 26.0–34.0)
MCHC: 30.4 g/dL (ref 30.0–36.0)
MCV: 96 fL (ref 80.0–100.0)
Monocytes Absolute: 0.6 10*3/uL (ref 0.1–1.0)
Monocytes Relative: 9 %
Neutro Abs: 3.5 10*3/uL (ref 1.7–7.7)
Neutrophils Relative %: 54 %
Platelets: 361 10*3/uL (ref 150–400)
RBC: 3.01 MIL/uL — ABNORMAL LOW (ref 3.87–5.11)
RDW: 15.5 % (ref 11.5–15.5)
WBC: 6.5 10*3/uL (ref 4.0–10.5)
nRBC: 0 % (ref 0.0–0.2)

## 2021-05-06 MED ORDER — SODIUM CHLORIDE 0.9% FLUSH
10.0000 mL | Freq: Once | INTRAVENOUS | Status: AC
Start: 2021-05-06 — End: 2021-05-06
  Administered 2021-05-06: 10 mL via INTRAVENOUS
  Filled 2021-05-06: qty 10

## 2021-05-06 MED ORDER — HYDROCODONE-ACETAMINOPHEN 5-325 MG PO TABS: 1.0000 | ORAL_TABLET | Freq: Four times a day (QID) | ORAL | 0 refills | Status: DC | PRN

## 2021-05-06 MED ORDER — HEPARIN SOD (PORK) LOCK FLUSH 100 UNIT/ML IV SOLN
INTRAVENOUS | Status: AC
  Filled 2021-05-06: qty 5

## 2021-05-06 MED ORDER — PROCHLORPERAZINE MALEATE 10 MG PO TABS
10.0000 mg | ORAL_TABLET | Freq: Once | ORAL | Status: AC
  Administered 2021-05-06: 10 mg via ORAL
  Filled 2021-05-06: qty 1

## 2021-05-06 MED ORDER — PACLITAXEL PROTEIN-BOUND CHEMO INJECTION 100 MG
100.0000 mg/m2 | Freq: Once | INTRAVENOUS | Status: AC
  Administered 2021-05-06: 175 mg via INTRAVENOUS
  Filled 2021-05-06: qty 35

## 2021-05-06 MED ORDER — SODIUM CHLORIDE 0.9 % IV SOLN
1400.0000 mg | Freq: Once | INTRAVENOUS | Status: AC
  Administered 2021-05-06: 1400 mg via INTRAVENOUS
  Filled 2021-05-06: qty 26.3

## 2021-05-06 MED ORDER — LORAZEPAM 0.5 MG PO TABS: ORAL_TABLET | ORAL | 0 refills | Status: DC

## 2021-05-06 MED ORDER — PANTOPRAZOLE SODIUM 20 MG PO TBEC: 20.0000 mg | DELAYED_RELEASE_TABLET | Freq: Every day | ORAL | 2 refills | Status: DC

## 2021-05-06 MED ORDER — SODIUM CHLORIDE 0.9 % IV SOLN
Freq: Once | INTRAVENOUS | Status: AC
Start: 2021-05-06 — End: 2021-05-06
  Filled 2021-05-06: qty 250

## 2021-05-06 MED ORDER — HEPARIN SOD (PORK) LOCK FLUSH 100 UNIT/ML IV SOLN
500.0000 [IU] | Freq: Once | INTRAVENOUS | Status: DC
  Filled 2021-05-06: qty 5

## 2021-05-06 MED ORDER — HEPARIN SOD (PORK) LOCK FLUSH 100 UNIT/ML IV SOLN
500.0000 [IU] | Freq: Once | INTRAVENOUS | Status: AC | PRN
  Administered 2021-05-06: 500 [IU]
  Filled 2021-05-06: qty 5

## 2021-05-06 NOTE — Patient Instructions (Signed)
[facility]COL[facility]  Discharge Instructions: Thank you for choosing [facility] to provide your oncology and hematology care.  If you have a lab appointment with the [facility], please go directly to the [facility] and check in at the registration area.  Wear comfortable clothing and clothing appropriate for easy access to any Portacath or PICC line.   We strive to give you quality time with your provider. You may need to reschedule your appointment if you arrive late (15 or more minutes).  Arriving late affects you and other patients whose appointments are after yours.  Also, if you miss three or more appointments without notifying the office, you may be dismissed from the clinic at the provider's discretion.      For prescription refill requests, have your pharmacy contact our office and allow 72 hours for refills to be completed.    Today you received the following chemotherapy and/or immunotherapy agents Abraxane, gemzar    To help prevent nausea and vomiting after your treatment, we encourage you to take your nausea medication as directed.  BELOW ARE SYMPTOMS THAT SHOULD BE REPORTED IMMEDIATELY: *FEVER GREATER THAN 100.4 F (38 C) OR HIGHER *CHILLS OR SWEATING *NAUSEA AND VOMITING THAT IS NOT CONTROLLED WITH YOUR NAUSEA MEDICATION *UNUSUAL SHORTNESS OF BREATH *UNUSUAL BRUISING OR BLEEDING *URINARY PROBLEMS (pain or burning when urinating, or frequent urination) *BOWEL PROBLEMS (unusual diarrhea, constipation, pain near the anus) TENDERNESS IN MOUTH AND THROAT WITH OR WITHOUT PRESENCE OF ULCERS (sore throat, sores in mouth, or a toothache) UNUSUAL RASH, SWELLING OR PAIN  UNUSUAL VAGINAL DISCHARGE OR ITCHING   Items with * indicate a potential emergency and should be followed up as soon as possible or go to the Emergency Department if any problems should occur.  Please show the CHEMOTHERAPY ALERT CARD or IMMUNOTHERAPY ALERT CARD at  check-in to the Emergency Department and triage nurse.  Should you have questions after your visit or need to cancel or reschedule your appointment, please contact [facility]  [phone] and follow the prompts.  Office hours are 8:00 a.m. to 4:30 p.m. Monday - Friday. Please note that voicemails left after 4:00 p.m. may not be returned until the following business day.  We are closed weekends and major holidays. You have access to a nurse at all times for urgent questions. Please call the main number to the clinic [phone] and follow the prompts.  For any non-urgent questions, you may also contact your provider using MyChart. We now offer e-Visits for anyone 23 and older to request care online for non-urgent symptoms. For details visit [email or web address].   Also download the MyChart app! Go to the app store, search "MyChart", open the app, select [facility], and log in with your MyChart username and password.  Due to Covid, a mask is required upon entering the hospital/clinic. If you do not have a mask, one will be given to you upon arrival. For doctor visits, patients may have 1 support person aged 53 or older with them. For treatment visits, patients cannot have anyone with them due to current Covid guidelines and our immunocompromised population.

## 2021-05-06 NOTE — Progress Notes (Signed)
Hematology/Oncology Consult note [facility]  [phone] [phone]  Patient Care Team: Dion Body, MD as PCP - General (Family Medicine) Clent Jacks, RN as Oncology Nurse Navigator   Name of the patient: Rhonda Day  [id number]  1940/12/01   Date of visit: 05/06/21  Diagnosis-metastatic pancreatic cancer with liver metastases  Chief complaint/ Reason for visit-on treatment assessment prior to cycle 5-day 8 of gemcitabine Abraxane chemotherapy  Heme/Onc history: Patient is a 80 year old female who underwent ultrasound abdomen in December 2021 which showed hypoechoic mass in the pancreatic tail measuring 2.1 x 1.8 x 4.5 cm which was not seen on prior CT scans on MRI exams a year ago.  This was followed by a CT abdomen and pelvis with contrast which showed ill-defined hypervascular lesion in segment 8 of liver measuring 1.6 x 1.4 cm concerning for metastases.  2 other lesions 1.7 x 1 cm and 1.5 x 0.8 cm were also noted.  Large mass in the body of pancreas measuring 5.6 x 2.2 x 2.9 cm.  The lesion causes obstruction of the pancreatic duct resulting in ductal dilatation throughout the tail of the pancreas.  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  Splenic vein appears chronically occluded.  Lesion also comes in contact with splenic artery which remains patent.  This was followed by MRI abdomen which again showed similar findings.  Multiple foci of restricted diffusion with hyperenhancement noted in the right hepatic lobe.  Prominent portocaval lymph node seen for local nodal metastases along with a 5.3 cm body and tail pancreatic mass   Case was discussed at tumor board and lab was liver biopsy 2 weeks after patient initial Covid test that was positive.  However at the day of the biopsy this lesion was in close association with  other vascular structures and given the potential higher risks liver biopsy was aborted and EUS was recommended.  She is currently on Cardizem.     Patient had a EUS at [place] By Dr. Mont Dutton. Pathology was consistent with adenocarcinoma. Patient had a PET scan on 12/11/2020 which showed a hypermetabolic lesion in the medial aspect of the liver with an SUV of 4.2 there is a subtle hypodensity on the comparison CT measuring 9 mm at this level. The hypermetabolic lesion in the dome of the liver corresponds to the enhancing lesion on the comparison MRI. 3.4 x 2.7 cm lesion in the mid body of pancreas with an SUV of 5.1. No hypermetabolic peripancreatic lymph nodes.    Patient received 1 cycle of modified FOLFIRINOX chemotherapy on 12/30/2020.  Following that patient had significant nausea vomiting fatigue and declining performance status requiring chemotherapy to be put on hold.  Patient is currently receiving gemcitabine Abraxane chemotherapy 2 weeks on and 1 week off   Interval history-she reports ongoing fatigue but overall feels at her baseline.  Appetite is fair and weight fluctuates.  She has baseline right hip pain for which she is using as needed hydrocodone  ECOG PS- 1 Pain scale- 3   Review of systems- Review of Systems  Constitutional:  Positive for malaise/fatigue. Negative for chills, fever and weight loss.  HENT:  Negative for congestion, ear discharge and nosebleeds.   Eyes:  Negative for blurred vision.  Respiratory:  Negative for cough, hemoptysis, sputum production, shortness of breath and wheezing.   Cardiovascular:  Negative for chest pain,  palpitations, orthopnea and claudication.  Gastrointestinal:  Negative for abdominal pain, blood in stool, constipation, diarrhea, heartburn, melena, nausea and vomiting.  Genitourinary:  Negative for dysuria, flank pain, frequency, hematuria and urgency.  Musculoskeletal:  Negative for back pain, joint pain and myalgias.       Right hip pain   Skin:  Negative for rash.  Neurological:  Negative for dizziness, tingling, focal weakness, seizures, weakness and headaches.  Endo/Heme/Allergies:  Does not bruise/bleed easily.  Psychiatric/Behavioral:  Negative for depression and suicidal ideas. The patient does not have insomnia.       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   B12 deficiency 09/30/2017   Bladder infection    Cancer associated pain 01/10/2021   Chronic allergic conjunctivitis 12/02/2020   Chronic cystitis 01/26/2017   Chronic pain syndrome 12/16/2017   Controlled type 2 diabetes mellitus with chronic kidney disease, without long-term current use of insulin ([place]) 01/09/2021   Controlled type 2 diabetes mellitus without complication ([place]) 4/0/8144   Convalescence following chemotherapy 01/10/2021   Degeneration of lumbar intervertebral disc 12/16/2017   Diabetes mellitus without complication ([place])    Dysuria 12/23/2016   Essential hypertension 11/15/2015   Family history of kidney cancer    Family history of kidney cancer    Family history of lung cancer    Family history of lung cancer    Family history of stomach cancer    Family history of stomach cancer    Family history of throat cancer    Family history of throat cancer    Female stress incontinence 11/11/2012   Genetic testing 01/28/2021   Negative genetic testing. No pathogenic variants identified on the [facility] Multi-Cancer+RNA panel. VUS in [facility] called c.1978G>A and VUS in [facility] called c.983C>T were identified. The report date its 01/25/2021.  The Multi-Cancer Panel + RNA offered by [facility] includes sequencing and/or deletion duplication testing of the following 84  genes: AIP, ALK, APC, ATM, AXIN2,BAP1,  BARD1, BLM, BMPR1A, BRC   Goals of care, counseling/discussion 12/13/2020   History of atrial fibrillation 04/09/2016   History of chronic health problem 05/11/2016   Hyperlipidemia, mixed 12/09/2020   Hypertension    Incomplete bladder emptying 11/16/2012   Low back pain 11/11/2012   Low bladder compliance 11/11/2012   Lumbar post-laminectomy syndrome 12/16/2017   Medicare annual wellness visit, subsequent 09/29/2016   Menopause syndrome 12/24/2016   Other specified symptom associated with female genital organs 11/11/2012   Pancreatic cancer [facility])    with liver mass   Pancreatic cancer metastasized to liver ([place]) 12/13/2020   Pancreatic cancer metastasized to liver ([place]) 12/13/2020   Paroxysmal A-fib ([place]) 12/09/2020   Stage 3b chronic kidney disease ([place]) 03/23/2020   Symptoms involving urinary system 11/11/2012   Type 2 diabetes mellitus ([place]) 11/15/2015   Type 2 diabetes mellitus with diabetic chronic kidney disease ([place]) 03/23/2020   Vaginal spotting 05/11/2017   Vasomotor rhinitis 12/02/2020     Past Surgical History:  Procedure Laterality Date   BACK SURGERY     PORTA CATH INSERTION N/A 12/27/2020   Procedure: PORTA CATH INSERTION;  Surgeon: Algernon Huxley, MD;  Location: [place] [facility];  Service: Cardiovascular;  Laterality: N/A;   TUBAL LIGATION      Social History   Socioeconomic History   Marital status: Married    Spouse name: Not on file   Number of children: Not on file   Years of education: Not on file   Highest education level: Not on file  Occupational History   Not on file  Tobacco Use   Smoking status: Former    Types: Cigarettes    Quit date: 11/20/1979    Years since quitting: 41.4   Smokeless tobacco: Never  Vaping Use   Vaping Use: Never used  Substance and Sexual Activity   Alcohol use: No    Alcohol/week: 0.0 standard drinks   Drug use: No   Sexual activity: Never  Other Topics Concern   Not on file  Social  History Narrative   Not on file   Social Determinants of Health   Financial Resource Strain: Not on file  Food Insecurity: Not on file  Transportation Needs: Not on file  Physical Activity: Not on file  Stress: Not on file  Social Connections: Not on file  Intimate Partner Violence: Not on file    Family History  Problem Relation Age of Onset   Kidney cancer Father 58   COPD Mother    Stomach cancer Maternal Aunt    Throat cancer Maternal Uncle    Liver cancer Paternal Uncle    Esophageal cancer Paternal Grandfather    Lung cancer Maternal Aunt    Lung cancer Maternal Uncle    Lung cancer Cousin    Kidney cancer Cousin 86   Testicular cancer Nephew        dx 20s   Breast cancer Neg Hx      Current Outpatient Medications:    azelastine (ASTELIN) 0.1 % nasal spray, Place into both nostrils daily as needed., Disp: , Rfl:    azelastine (OPTIVAR) 0.05 % ophthalmic solution, Place into both eyes daily as needed., Disp: , Rfl: 3   diltiazem (TIAZAC) 180 MG 24 hr capsule, Take by mouth daily., Disp: , Rfl:    ferrous sulfate 325 (65 FE) MG EC tablet, Take by mouth., Disp: , Rfl:    glimepiride (AMARYL) 2 MG tablet, Take 2 mg by mouth daily with breakfast., Disp: , Rfl:    losartan (COZAAR) 100 MG tablet, Take 100 mg by mouth daily., Disp: , Rfl:    metFORMIN (GLUCOPHAGE-XR) 500 MG 24 hr tablet, Take 500 mg by mouth 2 (two) times daily. 2 tabs twice a day, Disp: , Rfl: 0   morphine (MS CONTIN) 15 MG 12 hr tablet, Take 1 tablet (15 mg total) by mouth every 12 (twelve) hours., Disp: 60 tablet, Rfl: 0   ONE TOUCH ULTRA TEST test strip, , Disp: , Rfl: 1   ONETOUCH DELICA LANCETS 16X MISC, 1 each by XX route as directed. Check CBG's fasting once daily. Dx: E11.9, Disp: , Rfl:    traZODone (DESYREL) 50 MG tablet, Take 0.5-1 tablets (25-50 mg total) by mouth at bedtime as needed for sleep., Disp: 30 tablet, Rfl: 3   gabapentin (NEURONTIN) 100 MG capsule, Take 1 capsule (100 mg total) by  mouth at bedtime. (Patient not taking: No sig reported), Disp: 30 capsule, Rfl: 2   HYDROcodone-acetaminophen (NORCO) 5-325 MG tablet, Take 1 tablet by mouth every 6 (six) hours as needed for moderate pain., Disp: 60 tablet, Rfl: 0   LORazepam (ATIVAN) 0.5 MG tablet, TAKE 1 TABLET(0.5 MG) BY MOUTH EVERY 8 HOURS,  Disp: 60 tablet, Rfl: 0   pantoprazole (PROTONIX) 20 MG tablet, Take 1 tablet (20 mg total) by mouth daily., Disp: 30 tablet, Rfl: 2 No current facility-administered medications for this visit.  Facility-Administered Medications Ordered in Other Visits:    sodium chloride flush (NS) 0.9 % injection 10 mL, 10 mL, Intravenous, PRN, Sindy Guadeloupe, MD, 10 mL at 03/04/21 1045  Physical exam:  Vitals:   05/06/21 0934  BP: 126/61  Pulse: 80  Weight: 151 lb 14.4 oz (68.9 kg)   Physical Exam Constitutional:      Comments: Appears fatigued  HENT:     Head: Normocephalic.  Cardiovascular:     Rate and Rhythm: Normal rate and regular rhythm.     Heart sounds: Normal heart sounds.  Pulmonary:     Effort: Pulmonary effort is normal.     Breath sounds: Normal breath sounds.  Abdominal:     General: Bowel sounds are normal.     Palpations: Abdomen is soft.  Skin:    General: Skin is warm and dry.  Neurological:     Mental Status: She is alert and oriented to person, place, and time.     CMP Latest Ref Rng & Units 05/06/2021  Glucose 70 - 99 mg/dL 129(H)  BUN 8 - 23 mg/dL 36(H)  Creatinine 0.44 - 1.00 mg/dL 1.46(H)  Sodium 135 - 145 mmol/L 137  Potassium 3.5 - 5.1 mmol/L 4.4  Chloride 98 - 111 mmol/L 105  CO2 22 - 32 mmol/L 22  Calcium 8.9 - 10.3 mg/dL 9.7  Total Protein 6.5 - 8.1 g/dL 7.3  Total Bilirubin 0.3 - 1.2 mg/dL 0.5  Alkaline Phos 38 - 126 U/L 125  AST 15 - 41 U/L 20  ALT 0 - 44 U/L 30   CBC Latest Ref Rng & Units 05/06/2021  WBC 4.0 - 10.5 K/uL 6.5  Hemoglobin 12.0 - 15.0 g/dL 8.8(L)  Hematocrit 36.0 - 46.0 % 28.9(L)  Platelets 150 - 400 K/uL 361    No  images are attached to the encounter.  No results found.   Assessment and plan- Patient is a 80 y.o. female with metastatic pancreatic cancer and liver metastases here for on treatment assessment prior to cycle 5-day 8 of gemcitabine Abraxane chemotherapy  Patient's hemoglobin is presently between 8.8-9.8 and overall stable.  She will proceed with gemcitabine Abraxane chemotherapy today.  Next week will be her week off and I will see her back in 2 weeks for cycle 6-day 1 of gemcitabine Abraxane.  CA 19-9 has been fluctuating but recent scan showed stable disease.  Plan to repeat scans sometime in September 2022.  Chemo induced anemia: Stable continue to monitor  Hip pain: Unrelated to malignancy.  She is on as needed hydrocodone which I will refill today  Anxiety: On as needed lorazepam which I will refill today   Visit Diagnosis 1. Pancreatic cancer metastasized to liver ([place])   2. Antineoplastic chemotherapy induced anemia   3. Encounter for antineoplastic chemotherapy   4. Anxiety   5. Narcotic drug use      Dr. Randa Evens, MD, MPH [facility] [phone] 05/06/2021 1:03 PM

## 2021-05-15 ENCOUNTER — Inpatient Hospital Stay (HOSPITAL_BASED_OUTPATIENT_CLINIC_OR_DEPARTMENT_OTHER): Payer: Medicare PPO | Admitting: Hospice and Palliative Medicine

## 2021-05-15 DIAGNOSIS — C787 Secondary malignant neoplasm of liver and intrahepatic bile duct: Secondary | ICD-10-CM | POA: Diagnosis not present

## 2021-05-15 DIAGNOSIS — Z515 Encounter for palliative care: Secondary | ICD-10-CM | POA: Diagnosis not present

## 2021-05-15 DIAGNOSIS — C259 Malignant neoplasm of pancreas, unspecified: Secondary | ICD-10-CM

## 2021-05-15 NOTE — Progress Notes (Signed)
Virtual Visit via Telephone Note  I connected with Rhonda Day on 05/15/21 at  2:00 PM EDT by telephone and verified that I am speaking with the correct person using two identifiers.  Location: Patient: home Provider: clinic   I discussed the limitations, risks, security and privacy concerns of performing an evaluation and management service by telephone and the availability of in person appointments. I also discussed with the patient that there may be a patient responsible charge related to this service. The patient expressed understanding and agreed to proceed.   History of Present Illness: Rhonda Day is a 80 y.o. 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  Patient was found to have a pancreatic tail mass in December 2021.  She ultimately underwent EUS and biopsy with pathology consistent for adenocarcinoma.  Patient is referred to palliative care to help address goals and manage ongoing symptoms.   Observations/Objective: Spoke with patient by phone.  She only had a brief amount of time to talk as she was in route to her PCP.  She reports that she is doing reasonably "okay" without any significant changes or concerns today.  She continues to endorse fatigue but is still functional without significant decline in her performance status.  Appetite is adequate.  Pain is stable on Norco.  Patient infrequently takes MS Contin.    Assessment and Plan: Stage IV pancreatic cancer -on systemic chemotherapy.   Neoplasm related pain -continue MS Contin/Norco  Follow Up Instructions: Follow-up MyChart visit 1 month   I discussed the assessment and treatment plan with the patient. The patient was provided an opportunity to ask questions and all were answered. The patient agreed with the plan and demonstrated an understanding of the instructions.   The patient was advised to call back or  seek an in-person evaluation if the symptoms worsen or if the condition fails to improve as anticipated.  I provided 5 minutes of non-face-to-face time during this encounter.   Irean Hong, NP

## 2021-05-20 ENCOUNTER — Inpatient Hospital Stay (HOSPITAL_BASED_OUTPATIENT_CLINIC_OR_DEPARTMENT_OTHER): Payer: Medicare PPO | Admitting: Oncology

## 2021-05-20 ENCOUNTER — Inpatient Hospital Stay: Payer: Medicare PPO | Attending: Oncology

## 2021-05-20 ENCOUNTER — Encounter: Payer: Self-pay | Admitting: Oncology

## 2021-05-20 ENCOUNTER — Other Ambulatory Visit: Payer: Self-pay

## 2021-05-20 ENCOUNTER — Inpatient Hospital Stay: Payer: Medicare PPO

## 2021-05-20 VITALS — BP 104/59 | HR 78 | Temp 97.2°F | Wt 154.5 lb

## 2021-05-20 DIAGNOSIS — C259 Malignant neoplasm of pancreas, unspecified: Secondary | ICD-10-CM

## 2021-05-20 DIAGNOSIS — R5383 Other fatigue: Secondary | ICD-10-CM

## 2021-05-20 DIAGNOSIS — C787 Secondary malignant neoplasm of liver and intrahepatic bile duct: Secondary | ICD-10-CM | POA: Insufficient documentation

## 2021-05-20 DIAGNOSIS — D649 Anemia, unspecified: Secondary | ICD-10-CM | POA: Insufficient documentation

## 2021-05-20 DIAGNOSIS — Z8051 Family history of malignant neoplasm of kidney: Secondary | ICD-10-CM | POA: Diagnosis not present

## 2021-05-20 DIAGNOSIS — Z5111 Encounter for antineoplastic chemotherapy: Secondary | ICD-10-CM | POA: Insufficient documentation

## 2021-05-20 DIAGNOSIS — I480 Paroxysmal atrial fibrillation: Secondary | ICD-10-CM | POA: Diagnosis not present

## 2021-05-20 DIAGNOSIS — C251 Malignant neoplasm of body of pancreas: Secondary | ICD-10-CM | POA: Insufficient documentation

## 2021-05-20 DIAGNOSIS — N1832 Chronic kidney disease, stage 3b: Secondary | ICD-10-CM | POA: Diagnosis not present

## 2021-05-20 DIAGNOSIS — Z79899 Other long term (current) drug therapy: Secondary | ICD-10-CM | POA: Insufficient documentation

## 2021-05-20 DIAGNOSIS — E538 Deficiency of other specified B group vitamins: Secondary | ICD-10-CM | POA: Insufficient documentation

## 2021-05-20 DIAGNOSIS — Z801 Family history of malignant neoplasm of trachea, bronchus and lung: Secondary | ICD-10-CM | POA: Insufficient documentation

## 2021-05-20 DIAGNOSIS — R350 Frequency of micturition: Secondary | ICD-10-CM | POA: Diagnosis not present

## 2021-05-20 DIAGNOSIS — Z87891 Personal history of nicotine dependence: Secondary | ICD-10-CM | POA: Insufficient documentation

## 2021-05-20 DIAGNOSIS — D6481 Anemia due to antineoplastic chemotherapy: Secondary | ICD-10-CM | POA: Diagnosis not present

## 2021-05-20 DIAGNOSIS — I129 Hypertensive chronic kidney disease with stage 1 through stage 4 chronic kidney disease, or unspecified chronic kidney disease: Secondary | ICD-10-CM | POA: Diagnosis not present

## 2021-05-20 DIAGNOSIS — Z7984 Long term (current) use of oral hypoglycemic drugs: Secondary | ICD-10-CM | POA: Insufficient documentation

## 2021-05-20 DIAGNOSIS — T451X5A Adverse effect of antineoplastic and immunosuppressive drugs, initial encounter: Secondary | ICD-10-CM

## 2021-05-20 DIAGNOSIS — Z800 Family history of malignant neoplasm of digestive organs: Secondary | ICD-10-CM | POA: Diagnosis not present

## 2021-05-20 DIAGNOSIS — R300 Dysuria: Secondary | ICD-10-CM | POA: Insufficient documentation

## 2021-05-20 DIAGNOSIS — E1122 Type 2 diabetes mellitus with diabetic chronic kidney disease: Secondary | ICD-10-CM | POA: Insufficient documentation

## 2021-05-20 LAB — CBC WITH DIFFERENTIAL/PLATELET
Abs Immature Granulocytes: 0.06 10*3/uL (ref 0.00–0.07)
Basophils Absolute: 0 10*3/uL (ref 0.0–0.1)
Basophils Relative: 0 %
Eosinophils Absolute: 0.1 10*3/uL (ref 0.0–0.5)
Eosinophils Relative: 2 %
HCT: 28.2 % — ABNORMAL LOW (ref 36.0–46.0)
Hemoglobin: 8.6 g/dL — ABNORMAL LOW (ref 12.0–15.0)
Immature Granulocytes: 1 %
Lymphocytes Relative: 24 %
Lymphs Abs: 1.8 10*3/uL (ref 0.7–4.0)
MCH: 29.3 pg (ref 26.0–34.0)
MCHC: 30.5 g/dL (ref 30.0–36.0)
MCV: 95.9 fL (ref 80.0–100.0)
Monocytes Absolute: 0.9 10*3/uL (ref 0.1–1.0)
Monocytes Relative: 12 %
Neutro Abs: 4.6 10*3/uL (ref 1.7–7.7)
Neutrophils Relative %: 61 %
Platelets: 340 10*3/uL (ref 150–400)
RBC: 2.94 MIL/uL — ABNORMAL LOW (ref 3.87–5.11)
RDW: 16.1 % — ABNORMAL HIGH (ref 11.5–15.5)
WBC: 7.5 10*3/uL (ref 4.0–10.5)
nRBC: 0 % (ref 0.0–0.2)

## 2021-05-20 LAB — COMPREHENSIVE METABOLIC PANEL
ALT: 28 U/L (ref 0–44)
AST: 27 U/L (ref 15–41)
Albumin: 3.8 g/dL (ref 3.5–5.0)
Alkaline Phosphatase: 116 U/L (ref 38–126)
Anion gap: 10 (ref 5–15)
BUN: 37 mg/dL — ABNORMAL HIGH (ref 8–23)
CO2: 22 mmol/L (ref 22–32)
Calcium: 9.4 mg/dL (ref 8.9–10.3)
Chloride: 104 mmol/L (ref 98–111)
Creatinine, Ser: 1.7 mg/dL — ABNORMAL HIGH (ref 0.44–1.00)
GFR, Estimated: 31 mL/min — ABNORMAL LOW (ref >60)
Glucose, Bld: 193 mg/dL — ABNORMAL HIGH (ref 70–99)
Potassium: 4.2 mmol/L (ref 3.5–5.1)
Sodium: 136 mmol/L (ref 135–145)
Total Bilirubin: 0.5 mg/dL (ref 0.3–1.2)
Total Protein: 7.2 g/dL (ref 6.5–8.1)

## 2021-05-20 MED ORDER — HEPARIN SOD (PORK) LOCK FLUSH 100 UNIT/ML IV SOLN
500.0000 [IU] | Freq: Once | INTRAVENOUS | Status: AC | PRN
  Administered 2021-05-20: 500 [IU]
  Filled 2021-05-20: qty 5

## 2021-05-20 MED ORDER — PROCHLORPERAZINE MALEATE 10 MG PO TABS
10.0000 mg | ORAL_TABLET | Freq: Once | ORAL | Status: AC
  Administered 2021-05-20: 10 mg via ORAL
  Filled 2021-05-20: qty 1

## 2021-05-20 MED ORDER — SODIUM CHLORIDE 0.9 % IV SOLN
1400.0000 mg | Freq: Once | INTRAVENOUS | Status: AC
  Administered 2021-05-20: 1400 mg via INTRAVENOUS
  Filled 2021-05-20: qty 26.3

## 2021-05-20 MED ORDER — HEPARIN SOD (PORK) LOCK FLUSH 100 UNIT/ML IV SOLN
INTRAVENOUS | Status: AC
  Filled 2021-05-20: qty 5

## 2021-05-20 MED ORDER — PACLITAXEL PROTEIN-BOUND CHEMO INJECTION 100 MG
100.0000 mg/m2 | Freq: Once | INTRAVENOUS | Status: AC
  Administered 2021-05-20: 175 mg via INTRAVENOUS
  Filled 2021-05-20: qty 35

## 2021-05-20 MED ORDER — SODIUM CHLORIDE 0.9 % IV SOLN
Freq: Once | INTRAVENOUS | Status: AC
  Filled 2021-05-20: qty 250

## 2021-05-20 NOTE — Progress Notes (Signed)
Per MD SCr ok to treat.

## 2021-05-20 NOTE — Patient Instructions (Signed)
[facility]  Discharge Instructions: Thank you for choosing [facility] to provide your oncology and hematology care.  If you have a lab appointment with the [facility], please go directly to the [facility] and check in at the registration area.  Wear comfortable clothing and clothing appropriate for easy access to any Portacath or PICC line.   We strive to give you quality time with your provider. You may need to reschedule your appointment if you arrive late (15 or more minutes).  Arriving late affects you and other patients whose appointments are after yours.  Also, if you miss three or more appointments without notifying the office, you may be dismissed from the clinic at the provider's discretion.      For prescription refill requests, have your pharmacy contact our office and allow 72 hours for refills to be completed.    Today you received the following chemotherapy and/or immunotherapy agents:Gemzar, Abraxane      To help prevent nausea and vomiting after your treatment, we encourage you to take your nausea medication as directed.  BELOW ARE SYMPTOMS THAT SHOULD BE REPORTED IMMEDIATELY: *FEVER GREATER THAN 100.4 F (38 C) OR HIGHER *CHILLS OR SWEATING *NAUSEA AND VOMITING THAT IS NOT CONTROLLED WITH YOUR NAUSEA MEDICATION *UNUSUAL SHORTNESS OF BREATH *UNUSUAL BRUISING OR BLEEDING *URINARY PROBLEMS (pain or burning when urinating, or frequent urination) *BOWEL PROBLEMS (unusual diarrhea, constipation, pain near the anus) TENDERNESS IN MOUTH AND THROAT WITH OR WITHOUT PRESENCE OF ULCERS (sore throat, sores in mouth, or a toothache) UNUSUAL RASH, SWELLING OR PAIN  UNUSUAL VAGINAL DISCHARGE OR ITCHING   Items with * indicate a potential emergency and should be followed up as soon as possible or go to the Emergency Department if any problems should occur.  Please show the CHEMOTHERAPY ALERT CARD or IMMUNOTHERAPY ALERT CARD at  check-in to the Emergency Department and triage nurse.  Should you have questions after your visit or need to cancel or reschedule your appointment, please contact [facility]  [phone] and follow the prompts.  Office hours are 8:00 a.m. to 4:30 p.m. Monday - Friday. Please note that voicemails left after 4:00 p.m. may not be returned until the following business day.  We are closed weekends and major holidays. You have access to a nurse at all times for urgent questions. Please call the main number to the clinic [phone] and follow the prompts.  For any non-urgent questions, you may also contact your provider using MyChart. We now offer e-Visits for anyone 8 and older to request care online for non-urgent symptoms. For details visit [email or web address].   Also download the MyChart app! Go to the app store, search "MyChart", open the app, select [facility], and log in with your MyChart username and password.  Due to Covid, a mask is required upon entering the hospital/clinic. If you do not have a mask, one will be given to you upon arrival. For doctor visits, patients may have 1 support person aged 32 or older with them. For treatment visits, patients cannot have anyone with them due to current Covid guidelines and our immunocompromised population. Nanoparticle Albumin-Bound Paclitaxel injection What is this medication? NANOPARTICLE ALBUMIN-BOUND PACLITAXEL (Na no PAHR ti kuhl al BYOO muhn-bound PAK li TAX el) is a chemotherapy drug. It targets fast dividing cells, like cancer cells, and causes these cells to die. This medicine is used to treatadvanced breast cancer, lung cancer, and pancreatic cancer. This medicine may  be used for other purposes; ask your health care provider orpharmacist if you have questions. COMMON BRAND NAME(S): Abraxane What should I tell my care team before I take this medication? They need to know if you have any of these  conditions: kidney disease liver disease low blood counts, like low white cell, platelet, or red cell counts lung or breathing disease, like asthma tingling of the fingers or toes, or other nerve disorder an unusual or allergic reaction to paclitaxel, albumin, other chemotherapy, other medicines, foods, dyes, or preservatives pregnant or trying to get pregnant breast-feeding How should I use this medication? This drug is given as an infusion into a vein. It is administered in a hospitalor clinic by a specially trained health care professional. Talk to your pediatrician regarding the use of this medicine in children.Special care may be needed. Overdosage: If you think you have taken too much of this medicine contact apoison control center or emergency room at once. NOTE: This medicine is only for you. Do not share this medicine with others. What if I miss a dose? It is important not to miss your dose. Call your doctor or health careprofessional if you are unable to keep an appointment. What may interact with this medication? This medicine may interact with the following medications: antiviral medicines for hepatitis, HIV or AIDS certain antibiotics like erythromycin and clarithromycin certain medicines for fungal infections like ketoconazole and itraconazole certain medicines for seizures like carbamazepine, phenobarbital, phenytoin gemfibrozil nefazodone rifampin [facility] John's wort This list may not describe all possible interactions. Give your health care provider a list of all the medicines, herbs, non-prescription drugs, or dietary supplements you use. Also tell them if you smoke, drink alcohol, or use illegaldrugs. Some items may interact with your medicine. What should I watch for while using this medication? Your condition will be monitored carefully while you are receiving this medicine. You will need important blood work done while you are taking thismedicine. This medicine can  cause serious allergic reactions. If you experience allergic reactions like skin rash, itching or hives, swelling of the face, lips, ortongue, tell your doctor or health care professional right away. In some cases, you may be given additional medicines to help with side effects.Follow all directions for their use. This drug may make you feel generally unwell. This is not uncommon, as chemotherapy can affect healthy cells as well as cancer cells. Report any side effects. Continue your course of treatment even though you feel ill unless yourdoctor tells you to stop. Call your doctor or health care professional for advice if you get a fever, chills or sore throat, or other symptoms of a cold or flu. Do not treat yourself. This drug decreases your body's ability to fight infections. Try toavoid being around people who are sick. This medicine may increase your risk to bruise or bleed. Call your doctor orhealth care professional if you notice any unusual bleeding. Be careful brushing and flossing your teeth or using a toothpick because you may get an infection or bleed more easily. If you have any dental work done,tell your dentist you are receiving this medicine. Avoid taking products that contain aspirin, acetaminophen, ibuprofen, naproxen, or ketoprofen unless instructed by your doctor. These medicines may hide afever. Do not become pregnant while taking this medicine or for 6 months after stopping it. Women should inform their doctor if they wish to become pregnant or think they might be pregnant. Men should not father a child while taking this medicine or for  3 months after stopping it. There is a potential for serious side effects to an unborn child. Talk to your health care professionalor pharmacist for more information. Do not breast-feed an infant while taking this medicine or for 2 weeks afterstopping it. This medicine may interfere with the ability to get pregnant or to father a child. You should talk  to your doctor or health care professional if you areconcerned about your fertility. What side effects may I notice from receiving this medication? Side effects that you should report to your doctor or health care professionalas soon as possible: allergic reactions like skin rash, itching or hives, swelling of the face, lips, or tongue breathing problems changes in vision fast, irregular heartbeat low blood pressure mouth sores pain, tingling, numbness in the hands or feet signs of decreased platelets or bleeding - bruising, pinpoint red spots on the skin, black, tarry stools, blood in the urine signs of decreased red blood cells - unusually weak or tired, feeling faint or lightheaded, falls signs of infection - fever or chills, cough, sore throat, pain or difficulty passing urine signs and symptoms of liver injury like dark yellow or brown urine; general ill feeling or flu-like symptoms; light-colored stools; loss of appetite; nausea; right upper belly pain; unusually weak or tired; yellowing of the eyes or skin swelling of the ankles, feet, hands unusually slow heartbeat Side effects that usually do not require medical attention (report to yourdoctor or health care professional if they continue or are bothersome): diarrhea hair loss loss of appetite nausea, vomiting tiredness This list may not describe all possible side effects. Call your doctor for medical advice about side effects. You may report side effects to FDA at1-800-FDA-1088. Where should I keep my medication? This drug is given in a hospital or clinic and will not be stored at home. NOTE: This sheet is a summary. It may not cover all possible information. If you have questions about this medicine, talk to your doctor, pharmacist, orhealth care provider.  2022 [employer] Standard (2017-06-08 13:03:45) Gemcitabine injection What is this medication? GEMCITABINE (jem SYE ta been) is a chemotherapy drug. This medicine is used  to treat many types of cancer like breast cancer, lung cancer, pancreatic cancer,and ovarian cancer. This medicine may be used for other purposes; ask your health care provider orpharmacist if you have questions. COMMON BRAND NAME(S): Gemzar, Infugem What should I tell my care team before I take this medication? They need to know if you have any of these conditions: blood disorders infection kidney disease liver disease lung or breathing disease, like asthma recent or ongoing radiation therapy an unusual or allergic reaction to gemcitabine, other chemotherapy, other medicines, foods, dyes, or preservatives pregnant or trying to get pregnant breast-feeding How should I use this medication? This drug is given as an infusion into a vein. It is administered in a hospitalor clinic by a specially trained health care professional. Talk to your pediatrician regarding the use of this medicine in children.Special care may be needed. Overdosage: If you think you have taken too much of this medicine contact apoison control center or emergency room at once. NOTE: This medicine is only for you. Do not share this medicine with others. What if I miss a dose? It is important not to miss your dose. Call your doctor or health careprofessional if you are unable to keep an appointment. What may interact with this medication? medicines to increase blood counts like filgrastim, pegfilgrastim, sargramostim some other chemotherapy drugs like cisplatin vaccines  Talk to your doctor or health care professional before taking any of thesemedicines: acetaminophen aspirin ibuprofen ketoprofen naproxen This list may not describe all possible interactions. Give your health care provider a list of all the medicines, herbs, non-prescription drugs, or dietary supplements you use. Also tell them if you smoke, drink alcohol, or use illegaldrugs. Some items may interact with your medicine. What should I watch for while  using this medication? Visit your doctor for checks on your progress. This drug may make you feel generally unwell. This is not uncommon, as chemotherapy can affect healthy cells as well as cancer cells. Report any side effects. Continue your course oftreatment even though you feel ill unless your doctor tells you to stop. In some cases, you may be given additional medicines to help with side effects.Follow all directions for their use. Call your doctor or health care professional for advice if you get a fever, chills or sore throat, or other symptoms of a cold or flu. Do not treat yourself. This drug decreases your body's ability to fight infections. Try toavoid being around people who are sick. This medicine may increase your risk to bruise or bleed. Call your doctor orhealth care professional if you notice any unusual bleeding. Be careful brushing and flossing your teeth or using a toothpick because you may get an infection or bleed more easily. If you have any dental work done,tell your dentist you are receiving this medicine. Avoid taking products that contain aspirin, acetaminophen, ibuprofen, naproxen, or ketoprofen unless instructed by your doctor. These medicines may hide afever. Do not become pregnant while taking this medicine or for 6 months after stopping it. Women should inform their doctor if they wish to become pregnant or think they might be pregnant. Men should not father a child while taking this medicine and for 3 months after stopping it. There is a potential for serious side effects to an unborn child. Talk to your health care professional or pharmacist for more information. Do not breast-feed an infant while takingthis medicine or for at least 1 week after stopping it. Men should inform their doctors if they wish to father a child. This medicine may lower sperm counts. Talk with your doctor or health care professional ifyou are concerned about your fertility. What side effects may I  notice from receiving this medication? Side effects that you should report to your doctor or health care professionalas soon as possible: allergic reactions like skin rash, itching or hives, swelling of the face, lips, or tongue breathing problems pain, redness, or irritation at site where injected signs and symptoms of a dangerous change in heartbeat or heart rhythm like chest pain; dizziness; fast or irregular heartbeat; palpitations; feeling faint or lightheaded, falls; breathing problems signs of decreased platelets or bleeding - bruising, pinpoint red spots on the skin, black, tarry stools, blood in the urine signs of decreased red blood cells - unusually weak or tired, feeling faint or lightheaded, falls signs of infection - fever or chills, cough, sore throat, pain or difficulty passing urine signs and symptoms of kidney injury like trouble passing urine or change in the amount of urine signs and symptoms of liver injury like dark yellow or brown urine; general ill feeling or flu-like symptoms; light-colored stools; loss of appetite; nausea; right upper belly pain; unusually weak or tired; yellowing of the eyes or skin swelling of ankles, feet, hands Side effects that usually do not require medical attention (report to yourdoctor or health care professional if they continue  or are bothersome): constipation diarrhea hair loss loss of appetite nausea rash vomiting This list may not describe all possible side effects. Call your doctor for medical advice about side effects. You may report side effects to FDA at1-800-FDA-1088. Where should I keep my medication? This drug is given in a hospital or clinic and will not be stored at home. NOTE: This sheet is a summary. It may not cover all possible information. If you have questions about this medicine, talk to your doctor, pharmacist, orhealth care provider.  2022 [employer]/Gold Standard (2017-12-29 18:06:11)

## 2021-05-21 LAB — CANCER ANTIGEN 19-9: CA 19-9: 642 U/mL — ABNORMAL HIGH (ref 0–35)

## 2021-05-22 ENCOUNTER — Encounter: Payer: Self-pay | Admitting: Oncology

## 2021-05-22 NOTE — Progress Notes (Signed)
Hematology/Oncology Consult note [facility]  [phone] [phone]  Patient Care Team: Dion Body, MD as PCP - General (Family Medicine) Clent Jacks, RN as Oncology Nurse Navigator   Name of the patient: Rhonda Day  [id number]  1940/11/20   Date of visit: 05/22/21  Diagnosis- metastatic pancreatic cancer with liver metastases  Chief complaint/ Reason for visit-on treatment assessment prior to cycle 6-day 1 of gemcitabine Abraxane chemotherapy  Heme/Onc history: Patient is a 80 year old female who underwent ultrasound abdomen in December 2021 which showed hypoechoic mass in the pancreatic tail measuring 2.1 x 1.8 x 4.5 cm which was not seen on prior CT scans on MRI exams a year ago.  This was followed by a CT abdomen and pelvis with contrast which showed ill-defined hypervascular lesion in segment 8 of liver measuring 1.6 x 1.4 cm concerning for metastases.  2 other lesions 1.7 x 1 cm and 1.5 x 0.8 cm were also noted.  Large mass in the body of pancreas measuring 5.6 x 2.2 x 2.9 cm.  The lesion causes obstruction of the pancreatic duct resulting in ductal dilatation throughout the tail of the pancreas.  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  Splenic vein appears chronically occluded.  Lesion also comes in contact with splenic artery which remains patent.  This was followed by MRI abdomen which again showed similar findings.  Multiple foci of restricted diffusion with hyperenhancement noted in the right hepatic lobe.  Prominent portocaval lymph node seen for local nodal metastases along with a 5.3 cm body and tail pancreatic mass   Case was discussed at tumor board and lab was liver biopsy 2 weeks after patient initial Covid test that was positive.  However at the day of the biopsy this lesion was in close association with  other vascular structures and given the potential higher risks liver biopsy was aborted and EUS was recommended.  She is currently on Cardizem.     Patient had a EUS at [place] By Dr. Mont Dutton. Pathology was consistent with adenocarcinoma. Patient had a PET scan on 12/11/2020 which showed a hypermetabolic lesion in the medial aspect of the liver with an SUV of 4.2 there is a subtle hypodensity on the comparison CT measuring 9 mm at this level. The hypermetabolic lesion in the dome of the liver corresponds to the enhancing lesion on the comparison MRI. 3.4 x 2.7 cm lesion in the mid body of pancreas with an SUV of 5.1. No hypermetabolic peripancreatic lymph nodes.    Patient received 1 cycle of modified FOLFIRINOX chemotherapy on 12/30/2020.  Following that patient had significant nausea vomiting fatigue and declining performance status requiring chemotherapy to be put on hold.  Patient is currently receiving gemcitabine Abraxane chemotherapy 2 weeks on and 1 week off    Interval history-patient reports ongoing fatigue and some exertional shortness of breath but is able to carry on her ADLs.  Has occasional right hip pain which is well controlled with pain medications.  Denies any significant nausea or vomiting  ECOG PS- 1 Pain scale- 0   Review of systems- Review of Systems  Constitutional:  Positive for malaise/fatigue. Negative for chills, fever and weight loss.  HENT:  Negative for congestion, ear discharge and nosebleeds.   Eyes:  Negative for blurred vision.  Respiratory:  Negative for cough, hemoptysis, sputum production, shortness of breath and wheezing.  Cardiovascular:  Negative for chest pain, palpitations, orthopnea and claudication.  Gastrointestinal:  Negative for abdominal pain, blood in stool, constipation, diarrhea, heartburn, melena, nausea and vomiting.  Genitourinary:  Negative for dysuria, flank pain, frequency, hematuria and urgency.  Musculoskeletal:  Negative for back pain,  joint pain and myalgias.  Skin:  Negative for rash.  Neurological:  Negative for dizziness, tingling, focal weakness, seizures, weakness and headaches.  Endo/Heme/Allergies:  Does not bruise/bleed easily.  Psychiatric/Behavioral:  Negative for depression and suicidal ideas. The patient does not have insomnia.      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   B12 deficiency 09/30/2017   Bladder infection    Cancer associated pain 01/10/2021   Chronic allergic conjunctivitis 12/02/2020   Chronic cystitis 01/26/2017   Chronic pain syndrome 12/16/2017   Controlled type 2 diabetes mellitus with chronic kidney disease, without long-term current use of insulin ([place]) 01/09/2021   Controlled type 2 diabetes mellitus without complication ([place]) 05/22/6961   Convalescence following chemotherapy 01/10/2021   Degeneration of lumbar intervertebral disc 12/16/2017   Diabetes mellitus without complication ([place])    Dysuria 12/23/2016   Essential hypertension 11/15/2015   Family history of kidney cancer    Family history of kidney cancer    Family history of lung cancer    Family history of lung cancer    Family history of stomach cancer    Family history of stomach cancer    Family history of throat cancer    Family history of throat cancer    Female stress incontinence 11/11/2012   Genetic testing 01/28/2021   Negative genetic testing. No pathogenic variants identified on the [facility] Multi-Cancer+RNA panel. VUS in [facility] called c.1978G>A and VUS in [facility] called c.983C>T were identified. The report date its 01/25/2021.  The Multi-Cancer Panel + RNA offered by [facility] includes sequencing and/or deletion duplication testing  of the following 84 genes: AIP, ALK, APC, ATM, AXIN2,BAP1,  BARD1, BLM, BMPR1A, BRC   Goals of care, counseling/discussion 12/13/2020   History of atrial fibrillation 04/09/2016   History of chronic health problem 05/11/2016   Hyperlipidemia, mixed 12/09/2020   Hypertension    Incomplete bladder emptying 11/16/2012   Low back pain 11/11/2012   Low bladder compliance 11/11/2012   Lumbar post-laminectomy syndrome 12/16/2017   Medicare annual wellness visit, subsequent 09/29/2016   Menopause syndrome 12/24/2016   Other specified symptom associated with female genital organs 11/11/2012   Pancreatic cancer [facility])    with liver mass   Pancreatic cancer metastasized to liver ([place]) 12/13/2020   Pancreatic cancer metastasized to liver ([place]) 12/13/2020   Paroxysmal A-fib ([place]) 12/09/2020   Stage 3b chronic kidney disease ([place]) 03/23/2020   Symptoms involving urinary system 11/11/2012   Type 2 diabetes mellitus ([place]) 11/15/2015   Type 2 diabetes mellitus with diabetic chronic kidney disease ([place]) 03/23/2020   Vaginal spotting 05/11/2017   Vasomotor rhinitis 12/02/2020     Past Surgical History:  Procedure Laterality Date   BACK SURGERY     PORTA CATH INSERTION N/A 12/27/2020   Procedure: PORTA CATH INSERTION;  Surgeon: Algernon Huxley, MD;  Location: [place] [facility];  Service: Cardiovascular;  Laterality: N/A;  TUBAL LIGATION      Social History   Socioeconomic History   Marital status: Married    Spouse name: Not on file   Number of children: Not on file   Years of education: Not on file   Highest education level: Not on file  Occupational History   Not on file  Tobacco Use   Smoking status: Former    Types: Cigarettes    Quit date: 11/20/1979    Years since quitting: 41.5   Smokeless tobacco: Never  Vaping Use   Vaping Use: Never used  Substance and Sexual Activity   Alcohol use: No    Alcohol/week: 0.0 standard drinks   Drug use: No   Sexual activity: Never  Other Topics Concern   Not on  file  Social History Narrative   Not on file   Social Determinants of Health   Financial Resource Strain: Not on file  Food Insecurity: Not on file  Transportation Needs: Not on file  Physical Activity: Not on file  Stress: Not on file  Social Connections: Not on file  Intimate Partner Violence: Not on file    Family History  Problem Relation Age of Onset   Kidney cancer Father 26   COPD Mother    Stomach cancer Maternal Aunt    Throat cancer Maternal Uncle    Liver cancer Paternal Uncle    Esophageal cancer Paternal Grandfather    Lung cancer Maternal Aunt    Lung cancer Maternal Uncle    Lung cancer Cousin    Kidney cancer Cousin 38   Testicular cancer Nephew        dx 20s   Breast cancer Neg Hx      Current Outpatient Medications:    azelastine (ASTELIN) 0.1 % nasal spray, Place into both nostrils daily as needed., Disp: , Rfl:    azelastine (OPTIVAR) 0.05 % ophthalmic solution, Place into both eyes daily as needed., Disp: , Rfl: 3   diltiazem (TIAZAC) 180 MG 24 hr capsule, Take by mouth daily., Disp: , Rfl:    ferrous sulfate 325 (65 FE) MG EC tablet, Take by mouth., Disp: , Rfl:    gabapentin (NEURONTIN) 100 MG capsule, Take 1 capsule (100 mg total) by mouth at bedtime., Disp: 30 capsule, Rfl: 2   glimepiride (AMARYL) 2 MG tablet, Take 2 mg by mouth daily with breakfast., Disp: , Rfl:    HYDROcodone-acetaminophen (NORCO) 5-325 MG tablet, Take 1 tablet by mouth every 6 (six) hours as needed for moderate pain., Disp: 60 tablet, Rfl: 0   LORazepam (ATIVAN) 0.5 MG tablet, TAKE 1 TABLET(0.5 MG) BY MOUTH EVERY 8 HOURS, Disp: 60 tablet, Rfl: 0   losartan (COZAAR) 100 MG tablet, Take 100 mg by mouth daily., Disp: , Rfl:    metFORMIN (GLUCOPHAGE-XR) 500 MG 24 hr tablet, Take 500 mg by mouth 2 (two) times daily. 2 tabs twice a day, Disp: , Rfl: 0   morphine (MS CONTIN) 15 MG 12 hr tablet, Take 1 tablet (15 mg total) by mouth every 12 (twelve) hours., Disp: 60 tablet, Rfl: 0    ONE TOUCH ULTRA TEST test strip, , Disp: , Rfl: 1   ONETOUCH DELICA LANCETS 08Q MISC, 1 each by XX route as directed. Check CBG's fasting once daily. Dx: E11.9, Disp: , Rfl:    pantoprazole (PROTONIX) 20 MG tablet, Take 1 tablet (20 mg total) by mouth daily., Disp: 30 tablet, Rfl: 2   traZODone (DESYREL) 50 MG tablet, Take 0.5-1 tablets (  25-50 mg total) by mouth at bedtime as needed for sleep., Disp: 30 tablet, Rfl: 3 No current facility-administered medications for this visit.  Facility-Administered Medications Ordered in Other Visits:    sodium chloride flush (NS) 0.9 % injection 10 mL, 10 mL, Intravenous, PRN, Sindy Guadeloupe, MD, 10 mL at 03/04/21 1045  Physical exam:  Vitals:   05/20/21 1137  BP: (!) 104/59  Pulse: 78  Temp: (!) 97.2 F (36.2 C)  TempSrc: Tympanic  Weight: 154 lb 8 oz (70.1 kg)   Physical Exam Constitutional:      General: She is not in acute distress. Cardiovascular:     Rate and Rhythm: Normal rate and regular rhythm.     Heart sounds: Normal heart sounds.  Pulmonary:     Effort: Pulmonary effort is normal.     Breath sounds: Normal breath sounds.  Abdominal:     General: Bowel sounds are normal.     Palpations: Abdomen is soft.  Skin:    General: Skin is warm and dry.  Neurological:     Mental Status: She is alert and oriented to person, place, and time.     CMP Latest Ref Rng & Units 05/20/2021  Glucose 70 - 99 mg/dL 193(H)  BUN 8 - 23 mg/dL 37(H)  Creatinine 0.44 - 1.00 mg/dL 1.70(H)  Sodium 135 - 145 mmol/L 136  Potassium 3.5 - 5.1 mmol/L 4.2  Chloride 98 - 111 mmol/L 104  CO2 22 - 32 mmol/L 22  Calcium 8.9 - 10.3 mg/dL 9.4  Total Protein 6.5 - 8.1 g/dL 7.2  Total Bilirubin 0.3 - 1.2 mg/dL 0.5  Alkaline Phos 38 - 126 U/L 116  AST 15 - 41 U/L 27  ALT 0 - 44 U/L 28   CBC Latest Ref Rng & Units 05/20/2021  WBC 4.0 - 10.5 K/uL 7.5  Hemoglobin 12.0 - 15.0 g/dL 8.6(L)  Hematocrit 36.0 - 46.0 % 28.2(L)  Platelets 150 - 400 K/uL 340       Assessment and plan- Patient is a 80 y.o. female with metastatic pancreatic cancer and liver metastases.  She is here for on treatment assessment prior to cycle 6-day 1 of gemcitabine Abraxane chemotherapy  Patient is tolerating combination gemcitabine Abraxane chemotherapy relatively well and is able to maintain her ADLs.  She is getting more fatigued which I suspect is partly secondary to her worsening chemo induced anemia.We have already dose reduced gemcitabine to 800 mg meter squared and Abraxane to 100 mg per metered square and she is receiving it 2 weeks on 1 week off.  Despite that her hemoglobin has gradually drifted down from a baseline of 11-8.6 presently.  I am concerned about using EPO for chemo induced anemia given the significant increase in the incidence of thromboembolic events with people especially in patients with pancreatic cancer.  I would therefore like to modify her regimen and offer it 1 week on 1 week off until of 2 weeks on 1 week off.  She will receive chemotherapy today but no chemo next week.  She will directly proceed for cycle 6-day 15 of gemcitabine Abraxane chemotherapy in 2 weeks and I will see her back in 4 weeks.  We will check anemia labs at next blood draw   Visit Diagnosis 1. Pancreatic cancer metastasized to liver ([place])   2. Encounter for antineoplastic chemotherapy   3. Antineoplastic chemotherapy induced anemia   4. Chemotherapy-induced fatigue      Dr. Randa Evens, MD, MPH [place] at [facility]  [facility] [phone] 05/22/2021 7:15 AM

## 2021-05-26 ENCOUNTER — Other Ambulatory Visit: Payer: Self-pay | Admitting: Oncology

## 2021-05-27 ENCOUNTER — Other Ambulatory Visit: Payer: Medicare PPO

## 2021-05-27 ENCOUNTER — Ambulatory Visit: Payer: Medicare PPO

## 2021-06-02 ENCOUNTER — Other Ambulatory Visit: Payer: Self-pay | Admitting: *Deleted

## 2021-06-02 MED ORDER — HYDROCODONE-ACETAMINOPHEN 5-325 MG PO TABS: 1.0000 | ORAL_TABLET | Freq: Four times a day (QID) | ORAL | 0 refills | Status: DC | PRN

## 2021-06-02 NOTE — Telephone Encounter (Signed)
Patient requests refill of Rhonda Day and would like to speak with Rhonda Day about a possible UTI. Order pended for MD/NP approval.

## 2021-06-03 ENCOUNTER — Other Ambulatory Visit: Payer: Self-pay | Admitting: Oncology

## 2021-06-03 ENCOUNTER — Inpatient Hospital Stay: Payer: Medicare PPO

## 2021-06-03 ENCOUNTER — Other Ambulatory Visit: Payer: Self-pay | Admitting: *Deleted

## 2021-06-03 VITALS — BP 140/96 | HR 84 | Temp 98.4°F | Resp 16

## 2021-06-03 DIAGNOSIS — R7989 Other specified abnormal findings of blood chemistry: Secondary | ICD-10-CM

## 2021-06-03 DIAGNOSIS — C787 Secondary malignant neoplasm of liver and intrahepatic bile duct: Secondary | ICD-10-CM

## 2021-06-03 DIAGNOSIS — Z5111 Encounter for antineoplastic chemotherapy: Secondary | ICD-10-CM | POA: Diagnosis not present

## 2021-06-03 DIAGNOSIS — Z95828 Presence of other vascular implants and grafts: Secondary | ICD-10-CM

## 2021-06-03 DIAGNOSIS — R300 Dysuria: Secondary | ICD-10-CM

## 2021-06-03 LAB — CBC WITH DIFFERENTIAL/PLATELET
Abs Immature Granulocytes: 0.06 10*3/uL (ref 0.00–0.07)
Basophils Absolute: 0 10*3/uL (ref 0.0–0.1)
Basophils Relative: 0 %
Eosinophils Absolute: 0.2 10*3/uL (ref 0.0–0.5)
Eosinophils Relative: 2 %
HCT: 28.1 % — ABNORMAL LOW (ref 36.0–46.0)
Hemoglobin: 8.5 g/dL — ABNORMAL LOW (ref 12.0–15.0)
Immature Granulocytes: 1 %
Lymphocytes Relative: 18 %
Lymphs Abs: 1.7 10*3/uL (ref 0.7–4.0)
MCH: 28.6 pg (ref 26.0–34.0)
MCHC: 30.2 g/dL (ref 30.0–36.0)
MCV: 94.6 fL (ref 80.0–100.0)
Monocytes Absolute: 0.9 10*3/uL (ref 0.1–1.0)
Monocytes Relative: 10 %
Neutro Abs: 6.7 10*3/uL (ref 1.7–7.7)
Neutrophils Relative %: 69 %
Platelets: 287 10*3/uL (ref 150–400)
RBC: 2.97 MIL/uL — ABNORMAL LOW (ref 3.87–5.11)
RDW: 17 % — ABNORMAL HIGH (ref 11.5–15.5)
WBC: 9.5 10*3/uL (ref 4.0–10.5)
nRBC: 0 % (ref 0.0–0.2)

## 2021-06-03 LAB — URINALYSIS, COMPLETE (UACMP) WITH MICROSCOPIC
RBC / HPF: >50 RBC/hpf — ABNORMAL HIGH (ref 0–5)
Specific Gravity, Urine: 1.014 (ref 1.005–1.030)
WBC, UA: >50 WBC/hpf — ABNORMAL HIGH (ref 0–5)

## 2021-06-03 LAB — COMPREHENSIVE METABOLIC PANEL
ALT: 30 U/L (ref 0–44)
AST: 23 U/L (ref 15–41)
Albumin: 3.7 g/dL (ref 3.5–5.0)
Alkaline Phosphatase: 117 U/L (ref 38–126)
Anion gap: 9 (ref 5–15)
BUN: 58 mg/dL — ABNORMAL HIGH (ref 8–23)
CO2: 20 mmol/L — ABNORMAL LOW (ref 22–32)
Calcium: 9.4 mg/dL (ref 8.9–10.3)
Chloride: 109 mmol/L (ref 98–111)
Creatinine, Ser: 3.12 mg/dL — ABNORMAL HIGH (ref 0.44–1.00)
GFR, Estimated: 15 mL/min — ABNORMAL LOW (ref >60)
Glucose, Bld: 123 mg/dL — ABNORMAL HIGH (ref 70–99)
Potassium: 4.6 mmol/L (ref 3.5–5.1)
Sodium: 138 mmol/L (ref 135–145)
Total Bilirubin: 0.6 mg/dL (ref 0.3–1.2)
Total Protein: 7 g/dL (ref 6.5–8.1)

## 2021-06-03 LAB — IRON AND TIBC
Iron: 81 ug/dL (ref 28–170)
Saturation Ratios: 27 % (ref 10.4–31.8)
TIBC: 302 ug/dL (ref 250–450)
UIBC: 221 ug/dL

## 2021-06-03 LAB — FERRITIN: Ferritin: 41 ng/mL (ref 11–307)

## 2021-06-03 MED ORDER — SODIUM CHLORIDE 0.9 % IV SOLN
Freq: Once | INTRAVENOUS | Status: AC
  Filled 2021-06-03: qty 250

## 2021-06-03 MED ORDER — AMOXICILLIN-POT CLAVULANATE 250-62.5 MG/5ML PO SUSR: 250.0000 mg | Freq: Two times a day (BID) | ORAL | 0 refills | Status: AC

## 2021-06-03 MED ORDER — HEPARIN SOD (PORK) LOCK FLUSH 100 UNIT/ML IV SOLN
500.0000 [IU] | Freq: Once | INTRAVENOUS | Status: AC
  Administered 2021-06-03: 500 [IU] via INTRAVENOUS
  Filled 2021-06-03: qty 5

## 2021-06-03 NOTE — Progress Notes (Signed)
Patient c/o UTI symptoms x 1 week (burning, frequency, urgency, and chills). Denies any fever. Patient has been taking AZO otc with no relief. Dr. Janese Banks made aware. UA and Culture ordered.   Cr 3.12 & UA has resulted. Dr. Janese Banks made aware. Per Dr. Janese Banks, no chemo today. Patient is to instead receive 1L IVF and is to come back on Friday for Lab (BMP) & possible IVF. Patient made aware and encouraged to increase fluid intake at home. Patient verbalizes understanding and denies any further questions or concerns.

## 2021-06-04 LAB — URINE CULTURE

## 2021-06-06 ENCOUNTER — Inpatient Hospital Stay: Payer: Medicare PPO

## 2021-06-06 ENCOUNTER — Other Ambulatory Visit: Payer: Medicare PPO

## 2021-06-06 ENCOUNTER — Ambulatory Visit: Payer: Medicare PPO

## 2021-06-06 VITALS — BP 128/55 | HR 73 | Temp 98.7°F | Resp 18

## 2021-06-06 DIAGNOSIS — Z5111 Encounter for antineoplastic chemotherapy: Secondary | ICD-10-CM | POA: Diagnosis not present

## 2021-06-06 DIAGNOSIS — C787 Secondary malignant neoplasm of liver and intrahepatic bile duct: Secondary | ICD-10-CM

## 2021-06-06 DIAGNOSIS — C259 Malignant neoplasm of pancreas, unspecified: Secondary | ICD-10-CM

## 2021-06-06 LAB — CBC WITH DIFFERENTIAL/PLATELET
Abs Immature Granulocytes: 0.1 10*3/uL — ABNORMAL HIGH (ref 0.00–0.07)
Basophils Absolute: 0 10*3/uL (ref 0.0–0.1)
Basophils Relative: 0 %
Eosinophils Absolute: 0.1 10*3/uL (ref 0.0–0.5)
Eosinophils Relative: 1 %
HCT: 26.4 % — ABNORMAL LOW (ref 36.0–46.0)
Hemoglobin: 8 g/dL — ABNORMAL LOW (ref 12.0–15.0)
Immature Granulocytes: 1 %
Lymphocytes Relative: 15 %
Lymphs Abs: 1.2 10*3/uL (ref 0.7–4.0)
MCH: 28.7 pg (ref 26.0–34.0)
MCHC: 30.3 g/dL (ref 30.0–36.0)
MCV: 94.6 fL (ref 80.0–100.0)
Monocytes Absolute: 0.5 10*3/uL (ref 0.1–1.0)
Monocytes Relative: 6 %
Neutro Abs: 6.1 10*3/uL (ref 1.7–7.7)
Neutrophils Relative %: 77 %
Platelets: 251 10*3/uL (ref 150–400)
RBC: 2.79 MIL/uL — ABNORMAL LOW (ref 3.87–5.11)
RDW: 16.9 % — ABNORMAL HIGH (ref 11.5–15.5)
WBC: 8 10*3/uL (ref 4.0–10.5)
nRBC: 0 % (ref 0.0–0.2)

## 2021-06-06 LAB — COMPREHENSIVE METABOLIC PANEL
ALT: 29 U/L (ref 0–44)
AST: 27 U/L (ref 15–41)
Albumin: 3.6 g/dL (ref 3.5–5.0)
Alkaline Phosphatase: 116 U/L (ref 38–126)
Anion gap: 10 (ref 5–15)
BUN: 55 mg/dL — ABNORMAL HIGH (ref 8–23)
CO2: 19 mmol/L — ABNORMAL LOW (ref 22–32)
Calcium: 9.3 mg/dL (ref 8.9–10.3)
Chloride: 109 mmol/L (ref 98–111)
Creatinine, Ser: 2.85 mg/dL — ABNORMAL HIGH (ref 0.44–1.00)
GFR, Estimated: 17 mL/min — ABNORMAL LOW (ref >60)
Glucose, Bld: 226 mg/dL — ABNORMAL HIGH (ref 70–99)
Potassium: 4.8 mmol/L (ref 3.5–5.1)
Sodium: 138 mmol/L (ref 135–145)
Total Bilirubin: 0.4 mg/dL (ref 0.3–1.2)
Total Protein: 7.1 g/dL (ref 6.5–8.1)

## 2021-06-06 MED ORDER — SODIUM CHLORIDE 0.9% FLUSH
10.0000 mL | Freq: Once | INTRAVENOUS | Status: DC
  Filled 2021-06-06: qty 10

## 2021-06-06 MED ORDER — HEPARIN SOD (PORK) LOCK FLUSH 100 UNIT/ML IV SOLN
500.0000 [IU] | Freq: Once | INTRAVENOUS | Status: AC
  Administered 2021-06-06: 500 [IU] via INTRAVENOUS
  Filled 2021-06-06: qty 5

## 2021-06-06 MED ORDER — SODIUM CHLORIDE 0.9 % IV SOLN
INTRAVENOUS | Status: DC
  Filled 2021-06-06: qty 250

## 2021-06-10 ENCOUNTER — Ambulatory Visit: Payer: Medicare PPO

## 2021-06-10 ENCOUNTER — Inpatient Hospital Stay: Payer: Medicare PPO

## 2021-06-10 VITALS — BP 103/70 | HR 72 | Temp 98.3°F | Resp 16

## 2021-06-10 DIAGNOSIS — R7989 Other specified abnormal findings of blood chemistry: Secondary | ICD-10-CM

## 2021-06-10 DIAGNOSIS — Z95828 Presence of other vascular implants and grafts: Secondary | ICD-10-CM

## 2021-06-10 DIAGNOSIS — Z5111 Encounter for antineoplastic chemotherapy: Secondary | ICD-10-CM | POA: Diagnosis not present

## 2021-06-10 LAB — BASIC METABOLIC PANEL
Anion gap: 9 (ref 5–15)
BUN: 47 mg/dL — ABNORMAL HIGH (ref 8–23)
CO2: 20 mmol/L — ABNORMAL LOW (ref 22–32)
Calcium: 9.3 mg/dL (ref 8.9–10.3)
Chloride: 107 mmol/L (ref 98–111)
Creatinine, Ser: 2.66 mg/dL — ABNORMAL HIGH (ref 0.44–1.00)
GFR, Estimated: 18 mL/min — ABNORMAL LOW (ref >60)
Glucose, Bld: 164 mg/dL — ABNORMAL HIGH (ref 70–99)
Potassium: 4.1 mmol/L (ref 3.5–5.1)
Sodium: 136 mmol/L (ref 135–145)

## 2021-06-10 MED ORDER — HEPARIN SOD (PORK) LOCK FLUSH 100 UNIT/ML IV SOLN
500.0000 [IU] | Freq: Once | INTRAVENOUS | Status: AC
  Administered 2021-06-10: 500 [IU] via INTRAVENOUS
  Filled 2021-06-10: qty 5

## 2021-06-10 MED ORDER — SODIUM CHLORIDE 0.9% FLUSH
10.0000 mL | Freq: Once | INTRAVENOUS | Status: AC
  Administered 2021-06-10: 10 mL via INTRAVENOUS
  Filled 2021-06-10: qty 10

## 2021-06-10 MED ORDER — SODIUM CHLORIDE 0.9 % IV SOLN
INTRAVENOUS | Status: DC
  Filled 2021-06-10 (×2): qty 250

## 2021-06-12 ENCOUNTER — Inpatient Hospital Stay (HOSPITAL_BASED_OUTPATIENT_CLINIC_OR_DEPARTMENT_OTHER): Payer: Medicare PPO | Admitting: Hospice and Palliative Medicine

## 2021-06-12 DIAGNOSIS — C259 Malignant neoplasm of pancreas, unspecified: Secondary | ICD-10-CM | POA: Diagnosis not present

## 2021-06-12 DIAGNOSIS — C787 Secondary malignant neoplasm of liver and intrahepatic bile duct: Secondary | ICD-10-CM | POA: Diagnosis not present

## 2021-06-12 DIAGNOSIS — Z515 Encounter for palliative care: Secondary | ICD-10-CM

## 2021-06-12 NOTE — Progress Notes (Signed)
Virtual Visit via Telephone Note  I connected with Rhonda Day on 06/12/21 at  1:30 PM EDT by telephone and verified that I am speaking with the correct person using two identifiers.  Location: Patient: home Provider: clinic   I discussed the limitations, risks, security and privacy concerns of performing an evaluation and management service by telephone and the availability of in person appointments. I also discussed with the patient that there may be a patient responsible charge related to this service. The patient expressed understanding and agreed to proceed.   History of Present Illness: Rhonda Day is a 80 y.o. 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  Patient was found to have a pancreatic tail mass in December 2021.  She ultimately underwent EUS and biopsy with pathology consistent for adenocarcinoma.  Patient is referred to palliative care to help address goals and manage ongoing symptoms.   Observations/Objective: Spoke with patient by phone.  She reports that she is doing as well as can be expected.  She continues to endorse persistent fatigue but denies any other symptomatic burden at present.  No falls or changes in performance status.  No changes in appetite.  Patient had questions regarding what to expect on future scans.  She says she feels down sometimes regarding the cancer.  Emotional support provided.  No issues with medications nor need for refills.  Pain is reportedly stable.  Assessment and Plan: Stage IV pancreatic cancer -on systemic chemotherapy.   Neoplasm related pain -continue MS Contin/Norco  Follow Up Instructions: Follow-up MyChart visit 1 month   I discussed the assessment and treatment plan with the patient. The patient was provided an opportunity to ask questions and all were answered. The patient agreed with the plan and demonstrated an  understanding of the instructions.   The patient was advised to call back or seek an in-person evaluation if the symptoms worsen or if the condition fails to improve as anticipated.  I provided 5 minutes of non-face-to-face time during this encounter.   Irean Hong, NP

## 2021-06-17 ENCOUNTER — Inpatient Hospital Stay: Payer: Medicare PPO | Admitting: Oncology

## 2021-06-17 ENCOUNTER — Other Ambulatory Visit: Payer: Self-pay | Admitting: *Deleted

## 2021-06-17 ENCOUNTER — Inpatient Hospital Stay: Payer: Medicare PPO

## 2021-06-17 ENCOUNTER — Encounter: Payer: Self-pay | Admitting: Oncology

## 2021-06-17 ENCOUNTER — Other Ambulatory Visit: Payer: Self-pay

## 2021-06-17 VITALS — BP 140/57 | HR 58 | Resp 16

## 2021-06-17 VITALS — BP 133/80 | HR 68 | Temp 97.6°F | Resp 18 | Wt 159.0 lb

## 2021-06-17 DIAGNOSIS — C787 Secondary malignant neoplasm of liver and intrahepatic bile duct: Secondary | ICD-10-CM

## 2021-06-17 DIAGNOSIS — N179 Acute kidney failure, unspecified: Secondary | ICD-10-CM

## 2021-06-17 DIAGNOSIS — D649 Anemia, unspecified: Secondary | ICD-10-CM

## 2021-06-17 DIAGNOSIS — C259 Malignant neoplasm of pancreas, unspecified: Secondary | ICD-10-CM

## 2021-06-17 DIAGNOSIS — Z5111 Encounter for antineoplastic chemotherapy: Secondary | ICD-10-CM | POA: Diagnosis not present

## 2021-06-17 LAB — CBC WITH DIFFERENTIAL/PLATELET
Abs Immature Granulocytes: 0.04 10*3/uL (ref 0.00–0.07)
Basophils Absolute: 0 10*3/uL (ref 0.0–0.1)
Basophils Relative: 0 %
Eosinophils Absolute: 0.1 10*3/uL (ref 0.0–0.5)
Eosinophils Relative: 1 %
HCT: 25.1 % — ABNORMAL LOW (ref 36.0–46.0)
Hemoglobin: 7.7 g/dL — ABNORMAL LOW (ref 12.0–15.0)
Immature Granulocytes: 0 %
Lymphocytes Relative: 14 %
Lymphs Abs: 1.4 10*3/uL (ref 0.7–4.0)
MCH: 28.7 pg (ref 26.0–34.0)
MCHC: 30.7 g/dL (ref 30.0–36.0)
MCV: 93.7 fL (ref 80.0–100.0)
Monocytes Absolute: 0.8 10*3/uL (ref 0.1–1.0)
Monocytes Relative: 8 %
Neutro Abs: 7.6 10*3/uL (ref 1.7–7.7)
Neutrophils Relative %: 77 %
Platelets: 251 10*3/uL (ref 150–400)
RBC: 2.68 MIL/uL — ABNORMAL LOW (ref 3.87–5.11)
RDW: 17.2 % — ABNORMAL HIGH (ref 11.5–15.5)
WBC: 10 10*3/uL (ref 4.0–10.5)
nRBC: 0 % (ref 0.0–0.2)

## 2021-06-17 LAB — COMPREHENSIVE METABOLIC PANEL
ALT: 38 U/L (ref 0–44)
AST: 37 U/L (ref 15–41)
Albumin: 3.7 g/dL (ref 3.5–5.0)
Alkaline Phosphatase: 152 U/L — ABNORMAL HIGH (ref 38–126)
Anion gap: 9 (ref 5–15)
BUN: 40 mg/dL — ABNORMAL HIGH (ref 8–23)
CO2: 22 mmol/L (ref 22–32)
Calcium: 9.1 mg/dL (ref 8.9–10.3)
Chloride: 107 mmol/L (ref 98–111)
Creatinine, Ser: 2.65 mg/dL — ABNORMAL HIGH (ref 0.44–1.00)
GFR, Estimated: 18 mL/min — ABNORMAL LOW (ref >60)
Glucose, Bld: 139 mg/dL — ABNORMAL HIGH (ref 70–99)
Potassium: 4.1 mmol/L (ref 3.5–5.1)
Sodium: 138 mmol/L (ref 135–145)
Total Bilirubin: 0.3 mg/dL (ref 0.3–1.2)
Total Protein: 6.7 g/dL (ref 6.5–8.1)

## 2021-06-17 MED ORDER — SODIUM CHLORIDE 0.9 % IV SOLN
Freq: Once | INTRAVENOUS | Status: AC
  Filled 2021-06-17: qty 250

## 2021-06-17 MED ORDER — HEPARIN SOD (PORK) LOCK FLUSH 100 UNIT/ML IV SOLN
500.0000 [IU] | Freq: Once | INTRAVENOUS | Status: AC
  Filled 2021-06-17: qty 5

## 2021-06-17 MED ORDER — HEPARIN SOD (PORK) LOCK FLUSH 100 UNIT/ML IV SOLN
INTRAVENOUS | Status: AC
  Administered 2021-06-17: 500 [IU] via INTRAVENOUS
  Filled 2021-06-17: qty 5

## 2021-06-17 MED ORDER — IRON SUCROSE 20 MG/ML IV SOLN
200.0000 mg | Freq: Once | INTRAVENOUS | Status: AC
  Administered 2021-06-17: 200 mg via INTRAVENOUS
  Filled 2021-06-17: qty 10

## 2021-06-17 MED ORDER — SODIUM CHLORIDE 0.9 % IV SOLN: 200.0000 mg | Freq: Once | INTRAVENOUS | Status: DC

## 2021-06-17 MED ORDER — CYANOCOBALAMIN 1000 MCG/ML IJ SOLN
1000.0000 ug | Freq: Once | INTRAMUSCULAR | Status: AC
  Administered 2021-06-17: 1000 ug via INTRAMUSCULAR
  Filled 2021-06-17: qty 1

## 2021-06-17 NOTE — Progress Notes (Signed)
Dr Janese Banks wanted to see nephrology due to her creatinine elevated. She see [facility] has done labs. She will come back to see our office and if numbers better she can start back with chemo

## 2021-06-17 NOTE — Progress Notes (Signed)
HGB 7.7. Per Lanetta Inch., RN per Dr. Janese Banks, no treatment today. Patient will instead receive Venofer and B12 injection.   Patient tolerated first Venofer treatment well. Patient and VSS. Discharged home.

## 2021-06-18 ENCOUNTER — Other Ambulatory Visit: Payer: Self-pay

## 2021-06-18 DIAGNOSIS — N179 Acute kidney failure, unspecified: Secondary | ICD-10-CM | POA: Insufficient documentation

## 2021-06-18 DIAGNOSIS — C787 Secondary malignant neoplasm of liver and intrahepatic bile duct: Secondary | ICD-10-CM

## 2021-06-19 ENCOUNTER — Telehealth: Payer: Self-pay | Admitting: Oncology

## 2021-06-19 ENCOUNTER — Telehealth: Payer: Self-pay | Admitting: *Deleted

## 2021-06-19 NOTE — Telephone Encounter (Signed)
Please call her. thanks

## 2021-06-19 NOTE — Telephone Encounter (Signed)
Patient called reporting that she is confused over her appointments and does not know what to do. She states she got Iron and is asking if she will get more and B12 also she also mentioned that she did not get her treatment Please clarify as she is scheduled 9/23 for CT results

## 2021-06-19 NOTE — Telephone Encounter (Signed)
Left message with patient to make her aware of iron infusions scheduled. Requested she call back to confirm.

## 2021-06-20 ENCOUNTER — Other Ambulatory Visit: Payer: Self-pay | Admitting: Oncology

## 2021-06-20 DIAGNOSIS — D508 Other iron deficiency anemias: Secondary | ICD-10-CM

## 2021-06-20 DIAGNOSIS — D509 Iron deficiency anemia, unspecified: Secondary | ICD-10-CM | POA: Insufficient documentation

## 2021-06-22 ENCOUNTER — Encounter: Payer: Self-pay | Admitting: Oncology

## 2021-06-22 NOTE — Progress Notes (Signed)
Hematology/Oncology Consult note [facility]  Telephone:([phone] [phone]  Patient Care Team: Dion Body, MD as PCP - General (Family Medicine) Clent Jacks, RN as Oncology Nurse Navigator   Name of the patient: Rhonda Day  [id number]  [birth year]   Date of visit: 06/22/21  Diagnosis- metastatic pancreatic cancer with liver metastases  Chief complaint/ Reason for visit-on treatment assessment prior to cycle 7-day 1 of gemcitabine Abraxane chemotherapy  Heme/Onc history: Patient is a 80 year old female who underwent ultrasound abdomen in December 2021 which showed hypoechoic mass in the pancreatic tail measuring 2.1 x 1.8 x 4.5 cm which was not seen on prior CT scans on MRI exams a year ago.  This was followed by a CT abdomen and pelvis with contrast which showed ill-defined hypervascular lesion in segment 8 of liver measuring 1.6 x 1.4 cm concerning for metastases.  2 other lesions 1.7 x 1 cm and 1.5 x 0.8 cm were also noted.  Large mass in the body of pancreas measuring 5.6 x 2.2 x 2.9 cm.  The lesion causes obstruction of the pancreatic duct resulting in ductal dilatation throughout the tail of the pancreas.  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  Splenic vein appears chronically occluded.  Lesion also comes in contact with splenic artery which remains patent.  This was followed by MRI abdomen which again showed similar findings.  Multiple foci of restricted diffusion with hyperenhancement noted in the right hepatic lobe.  Prominent portocaval lymph node seen for local nodal metastases along with a 5.3 cm body and tail pancreatic mass   Case was discussed at tumor board and lab was liver biopsy 2 weeks after patient initial Covid test that was positive.  However at the day of the biopsy this lesion was in close association with  other vascular structures and given the potential higher risks liver biopsy was aborted and EUS was recommended.  She is currently on Cardizem.     Patient had a EUS at [facility] By Dr. Mont Dutton. Pathology was consistent with adenocarcinoma. Patient had a PET scan on 12/11/2020 which showed a hypermetabolic lesion in the medial aspect of the liver with an SUV of 4.2 there is a subtle hypodensity on the comparison CT measuring 9 mm at this level. The hypermetabolic lesion in the dome of the liver corresponds to the enhancing lesion on the comparison MRI. 3.4 x 2.7 cm lesion in the mid body of pancreas with an SUV of 5.1. No hypermetabolic peripancreatic lymph nodes.    Patient received 1 cycle of modified FOLFIRINOX chemotherapy on 12/30/2020.  Following that patient had significant nausea vomiting fatigue and declining performance status requiring chemotherapy to be put on hold.  Patient is currently receiving gemcitabine Abraxane chemotherapy 2 weeks on and 1 week off     Chemotherapy currently complicated by worsening anemia  Interval history-patient last received chemotherapy on 05/20/2021.  Since then chemotherapy has been held due to AKI as well as anemia.  Patient currently reports baseline fatigue.  Denies any new complaints at this time.  She is not currently taking any NSAIDs.  She is not on any diuretics.  ECOG PS- 1 Pain scale- 0   Review of systems- Review of Systems  Constitutional:  Positive for malaise/fatigue. Negative for chills, fever and weight loss.  HENT:  Negative for congestion, ear discharge and nosebleeds.   Eyes:  Negative for blurred vision.  Respiratory:  Negative for cough, hemoptysis, sputum production, shortness of breath and wheezing.   Cardiovascular:  Negative for chest pain, palpitations, orthopnea and claudication.  Gastrointestinal:  Negative for abdominal pain, blood in stool, constipation, diarrhea, heartburn, melena, nausea and vomiting.  Genitourinary:  Negative  for dysuria, flank pain, frequency, hematuria and urgency.  Musculoskeletal:  Negative for back pain, joint pain and myalgias.  Skin:  Negative for rash.  Neurological:  Negative for dizziness, tingling, focal weakness, seizures, weakness and headaches.  Endo/Heme/Allergies:  Does not bruise/bleed easily.  Psychiatric/Behavioral:  Negative for depression and suicidal ideas. The patient does not have insomnia.      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   B12 deficiency 09/30/2017   Bladder infection    Cancer associated pain 01/10/2021   Chronic allergic conjunctivitis 12/02/2020   Chronic cystitis 01/26/2017   Chronic pain syndrome 12/16/2017   Controlled type 2 diabetes mellitus with chronic kidney disease, without long-term current use of insulin ([place]) 01/09/2021   Controlled type 2 diabetes mellitus without complication ([place]) 12/22/4381   Convalescence following chemotherapy 01/10/2021   Degeneration of lumbar intervertebral disc 12/16/2017   Diabetes mellitus without complication ([place])    Dysuria 12/23/2016   Essential hypertension 11/15/2015   Family history of kidney cancer    Family history of kidney cancer    Family history of lung cancer    Family history of lung cancer    Family history of stomach cancer    Family history of stomach cancer    Family history of throat cancer    Family history of throat cancer    Female stress incontinence 11/11/2012   Genetic testing 01/28/2021   Negative genetic testing. No pathogenic variants identified on the [facility] Multi-Cancer+RNA panel. VUS in [facility] called c.1978G>A and VUS in [facility] called c.983C>T were identified. The report date its 01/25/2021.   The Multi-Cancer Panel + RNA offered by [facility] includes sequencing and/or deletion duplication testing of the following 84 genes: AIP, ALK, APC, ATM, AXIN2,BAP1,  BARD1, BLM, BMPR1A, BRC   Goals of care, counseling/discussion 12/13/2020   History of atrial fibrillation 04/09/2016   History of chronic health problem 05/11/2016   Hyperlipidemia, mixed 12/09/2020   Hypertension    Incomplete bladder emptying 11/16/2012   Low back pain 11/11/2012   Low bladder compliance 11/11/2012   Lumbar post-laminectomy syndrome 12/16/2017   Medicare annual wellness visit, subsequent 09/29/2016   Menopause syndrome 12/24/2016   Other specified symptom associated with female genital organs 11/11/2012   Pancreatic cancer [facility])    with liver mass   Pancreatic cancer metastasized to liver ([place]) 12/13/2020   Pancreatic cancer metastasized to liver ([place]) 12/13/2020   Paroxysmal A-fib ([place]) 12/09/2020   Stage 3b chronic kidney disease ([place]) 03/23/2020   Symptoms involving urinary system 11/11/2012   Type 2 diabetes mellitus ([place]) 11/15/2015   Type 2 diabetes mellitus with diabetic chronic kidney disease ([place]) 03/23/2020   Vaginal spotting 05/11/2017   Vasomotor rhinitis 12/02/2020     Past Surgical History:  Procedure Laterality Date   BACK SURGERY     PORTA CATH INSERTION N/A 12/27/2020   Procedure: PORTA CATH INSERTION;  Surgeon: Algernon Huxley, MD;  Location: [facility];  Service: Cardiovascular;  Laterality: N/A;   TUBAL LIGATION      Social History   Socioeconomic History   Marital status: Married    Spouse name: Not on file   Number of children: Not on file   Years of education: Not on file   Highest education level: Not on file  Occupational History   Not on file  Tobacco Use   Smoking status: Former    Types: Cigarettes    Quit date: 11/20/1979    Years since quitting: 41.6   Smokeless tobacco: Never  Vaping Use   Vaping Use: Never used  Substance and Sexual Activity   Alcohol use: No     Alcohol/week: 0.0 standard drinks   Drug use: No   Sexual activity: Never  Other Topics Concern   Not on file  Social History Narrative   Not on file   Social Determinants of Health   Financial Resource Strain: Not on file  Food Insecurity: Not on file  Transportation Needs: Not on file  Physical Activity: Not on file  Stress: Not on file  Social Connections: Not on file  Intimate Partner Violence: Not on file    Family History  Problem Relation Age of Onset   Kidney cancer Father 38   COPD Mother    Stomach cancer Maternal Aunt    Throat cancer Maternal Uncle    Liver cancer Paternal Uncle    Esophageal cancer Paternal Grandfather    Lung cancer Maternal Aunt    Lung cancer Maternal Uncle    Lung cancer Cousin    Kidney cancer Cousin 109   Testicular cancer Nephew        dx 20s   Breast cancer Neg Hx      Current Outpatient Medications:    azelastine (ASTELIN) 0.1 % nasal spray, Place into both nostrils daily as needed., Disp: , Rfl:    azelastine (OPTIVAR) 0.05 % ophthalmic solution, Place into both eyes daily as needed., Disp: , Rfl: 3   diltiazem (TIAZAC) 180 MG 24 hr capsule, Take by mouth daily., Disp: , Rfl:    ferrous sulfate 325 (65 FE) MG EC tablet, Take by mouth., Disp: , Rfl:    gabapentin (NEURONTIN) 100 MG capsule, Take 1 capsule (100 mg total) by mouth at bedtime., Disp: 30 capsule, Rfl: 2   glimepiride (AMARYL) 2 MG tablet, Take 2 mg by mouth daily with breakfast., Disp: , Rfl:    HYDROcodone-acetaminophen (NORCO) 5-325 MG tablet, Take 1 tablet by mouth every 6 (six) hours as needed for moderate pain., Disp: 60 tablet, Rfl: 0   LORazepam (ATIVAN) 0.5 MG tablet, TAKE 1 TABLET(0.5 MG) BY MOUTH EVERY 8 HOURS, Disp: 60 tablet, Rfl: 0   losartan (COZAAR) 100 MG tablet, Take 100 mg by mouth daily., Disp: , Rfl:    losartan (COZAAR) 25 MG tablet, Take 1 tablet by mouth daily., Disp: , Rfl:    metFORMIN (GLUCOPHAGE-XR) 500 MG 24 hr tablet, Take 500 mg by mouth 2  (two) times daily. 2 tabs twice a day, Disp: , Rfl: 0   metFORMIN (GLUCOPHAGE-XR) 500 MG 24 hr tablet, Take 2 tablets by mouth 2 (two) times daily with a meal., Disp: , Rfl:    morphine (MS CONTIN) 15 MG 12 hr tablet, Take 1 tablet (15 mg total) by mouth every 12 (twelve) hours., Disp: 60 tablet, Rfl: 0   ONE TOUCH ULTRA TEST test strip, ,  Disp: , Rfl: 1   ONETOUCH DELICA LANCETS 62I MISC, 1 each by XX route as directed. Check CBG's fasting once daily. Dx: E11.9, Disp: , Rfl:    pantoprazole (PROTONIX) 20 MG tablet, Take 1 tablet (20 mg total) by mouth daily., Disp: 30 tablet, Rfl: 2   traZODone (DESYREL) 50 MG tablet, Take 0.5-1 tablets (25-50 mg total) by mouth at bedtime as needed for sleep., Disp: 30 tablet, Rfl: 3 No current facility-administered medications for this visit.  Facility-Administered Medications Ordered in Other Visits:    sodium chloride flush (NS) 0.9 % injection 10 mL, 10 mL, Intravenous, PRN, Sindy Guadeloupe, MD, 10 mL at 03/04/21 1045  Physical exam:  Vitals:   06/17/21 1044  BP: 133/80  Pulse: 68  Resp: 18  Temp: 97.6 F (36.4 C)  TempSrc: Tympanic  SpO2: 99%  Weight: 159 lb (72.1 kg)   Physical Exam Cardiovascular:     Rate and Rhythm: Normal rate and regular rhythm.     Heart sounds: Normal heart sounds.  Pulmonary:     Effort: Pulmonary effort is normal.     Breath sounds: Normal breath sounds.  Abdominal:     General: Bowel sounds are normal.     Palpations: Abdomen is soft.  Skin:    General: Skin is warm and dry.  Neurological:     Mental Status: She is alert and oriented to person, place, and time.     CMP Latest Ref Rng & Units 06/17/2021  Glucose 70 - 99 mg/dL 139(H)  BUN 8 - 23 mg/dL 40(H)  Creatinine 0.44 - 1.00 mg/dL 2.65(H)  Sodium 135 - 145 mmol/L 138  Potassium 3.5 - 5.1 mmol/L 4.1  Chloride 98 - 111 mmol/L 107  CO2 22 - 32 mmol/L 22  Calcium 8.9 - 10.3 mg/dL 9.1  Total Protein 6.5 - 8.1 g/dL 6.7  Total Bilirubin 0.3 - 1.2 mg/dL  0.3  Alkaline Phos 38 - 126 U/L 152(H)  AST 15 - 41 U/L 37  ALT 0 - 44 U/L 38   CBC Latest Ref Rng & Units 06/17/2021  WBC 4.0 - 10.5 K/uL 10.0  Hemoglobin 12.0 - 15.0 g/dL 7.7(L)  Hematocrit 36.0 - 46.0 % 25.1(L)  Platelets 150 - 400 K/uL 251   Assessment and plan- Patient is a 80 y.o. female with metastatic pancreatic cancer with liver metastases last received gemcitabine and Abraxane chemotherapy on 05/20/2021 here for following issues:  1.  WLN:[id number]'E creatinine at baseline is around 1.4-1.7.  It was 1.7 on 05/20/2021 when she last received gemcitabine Abraxane.  2 weeks later her creatinine had gone up to 3.1.  She has been receiving IV fluids and over the last 2 weeks her creatinine has stabilized to 2.6 but has not improved any further.  She has an upcoming appointment with nephrology as well.  Abraxane can cause acute kidney injury and increasing serum creatinine about 11% of patients and it is unclear if that is what we are dealing with.  At this time we will hold off chemotherapy today given her AKI and anemia.  Encourage patient to continue with her oral fluid intake and we will hold off on any IV fluids today since patient is drinking adequate fluids at home.  Await nephrology recommendations.  2.  Normocytic anemia: Suspect anemia has worsened bone due to worsening renal functions as well as chemotherapy-induced anemia.  Her ferritin levels are 41 presentlyWith iron saturation of 27% and TIBC of 302.  B12 levels in July  were at the lower end of normal at 342.  Given her CKD it would be reasonable to try to keep her ferritin levels closer to 100 and I will also give her 2 B12 injections along with 5 doses of Venofer.  I am hesitant to use EPO for her anemia as it can be associated with increased risk of thromboembolic events especially in the setting of pancreatic cancer.  Discussed risks and benefits of IV iron including all but not limited to possible risk of allergic reaction.  Patient  understands and agrees to proceed as planned  We will repeat scans in about 2 weeks and I will see her thereafter with a repeat CBC and BMP.  Hold chemotherapy until then    Visit Diagnosis 1. Normocytic anemia   2. Pancreatic cancer metastasized to liver ([place])   3. AKI (acute kidney injury) ([place])      Dr. Randa Evens, MD, MPH [facility] [phone] 06/22/2021 7:12 AM

## 2021-06-24 ENCOUNTER — Other Ambulatory Visit: Payer: Self-pay | Admitting: Oncology

## 2021-06-25 ENCOUNTER — Inpatient Hospital Stay: Payer: Medicare PPO | Attending: Oncology

## 2021-06-25 VITALS — BP 149/71 | HR 84 | Temp 97.5°F | Resp 16

## 2021-06-25 DIAGNOSIS — D508 Other iron deficiency anemias: Secondary | ICD-10-CM

## 2021-06-25 DIAGNOSIS — N1832 Chronic kidney disease, stage 3b: Secondary | ICD-10-CM | POA: Diagnosis not present

## 2021-06-25 DIAGNOSIS — C252 Malignant neoplasm of tail of pancreas: Secondary | ICD-10-CM | POA: Diagnosis present

## 2021-06-25 DIAGNOSIS — D631 Anemia in chronic kidney disease: Secondary | ICD-10-CM | POA: Diagnosis not present

## 2021-06-25 DIAGNOSIS — Z5111 Encounter for antineoplastic chemotherapy: Secondary | ICD-10-CM | POA: Diagnosis present

## 2021-06-25 DIAGNOSIS — R222 Localized swelling, mass and lump, trunk: Secondary | ICD-10-CM | POA: Insufficient documentation

## 2021-06-25 DIAGNOSIS — Z79891 Long term (current) use of opiate analgesic: Secondary | ICD-10-CM | POA: Insufficient documentation

## 2021-06-25 DIAGNOSIS — C787 Secondary malignant neoplasm of liver and intrahepatic bile duct: Secondary | ICD-10-CM | POA: Insufficient documentation

## 2021-06-25 DIAGNOSIS — Z87891 Personal history of nicotine dependence: Secondary | ICD-10-CM | POA: Insufficient documentation

## 2021-06-25 MED ORDER — HEPARIN SOD (PORK) LOCK FLUSH 100 UNIT/ML IV SOLN
INTRAVENOUS | Status: AC
  Administered 2021-06-25: 500 [IU]
  Filled 2021-06-25: qty 5

## 2021-06-25 MED ORDER — SODIUM CHLORIDE 0.9 % IV SOLN
Freq: Once | INTRAVENOUS | Status: AC
  Filled 2021-06-25: qty 250

## 2021-06-25 MED ORDER — SODIUM CHLORIDE 0.9 % IV SOLN: 200.0000 mg | INTRAVENOUS | Status: DC

## 2021-06-25 MED ORDER — IRON SUCROSE 20 MG/ML IV SOLN
200.0000 mg | Freq: Once | INTRAVENOUS | Status: AC
  Administered 2021-06-25: 200 mg via INTRAVENOUS
  Filled 2021-06-25: qty 10

## 2021-06-26 ENCOUNTER — Other Ambulatory Visit: Payer: Self-pay | Admitting: *Deleted

## 2021-06-26 MED ORDER — HYDROCODONE-ACETAMINOPHEN 5-325 MG PO TABS: 1.0000 | ORAL_TABLET | Freq: Four times a day (QID) | ORAL | 0 refills | Status: DC | PRN

## 2021-06-27 ENCOUNTER — Inpatient Hospital Stay: Payer: Medicare PPO

## 2021-06-27 VITALS — BP 135/74 | HR 99 | Resp 16

## 2021-06-27 DIAGNOSIS — D508 Other iron deficiency anemias: Secondary | ICD-10-CM

## 2021-06-27 DIAGNOSIS — Z5111 Encounter for antineoplastic chemotherapy: Secondary | ICD-10-CM | POA: Diagnosis not present

## 2021-06-27 MED ORDER — HEPARIN SOD (PORK) LOCK FLUSH 100 UNIT/ML IV SOLN
500.0000 [IU] | Freq: Once | INTRAVENOUS | Status: AC | PRN
  Administered 2021-06-27: 500 [IU]
  Filled 2021-06-27: qty 5

## 2021-06-27 MED ORDER — SODIUM CHLORIDE 0.9 % IV SOLN
Freq: Once | INTRAVENOUS | Status: AC
  Filled 2021-06-27: qty 250

## 2021-06-27 MED ORDER — IRON SUCROSE 20 MG/ML IV SOLN
200.0000 mg | Freq: Once | INTRAVENOUS | Status: AC
  Administered 2021-06-27: 200 mg via INTRAVENOUS
  Filled 2021-06-27: qty 10

## 2021-06-27 MED ORDER — SODIUM CHLORIDE 0.9 % IV SOLN: 200.0000 mg | INTRAVENOUS | Status: DC

## 2021-06-27 MED ORDER — HEPARIN SOD (PORK) LOCK FLUSH 100 UNIT/ML IV SOLN
INTRAVENOUS | Status: AC
  Filled 2021-06-27: qty 5

## 2021-07-01 ENCOUNTER — Inpatient Hospital Stay: Payer: Medicare PPO

## 2021-07-01 ENCOUNTER — Other Ambulatory Visit: Payer: Self-pay

## 2021-07-01 VITALS — BP 140/59 | HR 98 | Temp 98.1°F

## 2021-07-01 DIAGNOSIS — D508 Other iron deficiency anemias: Secondary | ICD-10-CM

## 2021-07-01 DIAGNOSIS — Z5111 Encounter for antineoplastic chemotherapy: Secondary | ICD-10-CM | POA: Diagnosis not present

## 2021-07-01 MED ORDER — SODIUM CHLORIDE 0.9% FLUSH
10.0000 mL | Freq: Once | INTRAVENOUS | Status: AC | PRN
  Administered 2021-07-01: 10 mL
  Filled 2021-07-01: qty 10

## 2021-07-01 MED ORDER — SODIUM CHLORIDE 0.9 % IV SOLN
Freq: Once | INTRAVENOUS | Status: AC
  Filled 2021-07-01: qty 250

## 2021-07-01 MED ORDER — SODIUM CHLORIDE 0.9 % IV SOLN: 200.0000 mg | INTRAVENOUS | Status: DC

## 2021-07-01 MED ORDER — HEPARIN SOD (PORK) LOCK FLUSH 100 UNIT/ML IV SOLN
500.0000 [IU] | Freq: Once | INTRAVENOUS | Status: AC | PRN
  Administered 2021-07-01: 500 [IU]
  Filled 2021-07-01: qty 5

## 2021-07-01 MED ORDER — IRON SUCROSE 20 MG/ML IV SOLN
200.0000 mg | Freq: Once | INTRAVENOUS | Status: AC
  Administered 2021-07-01: 200 mg via INTRAVENOUS
  Filled 2021-07-01: qty 10

## 2021-07-01 NOTE — Patient Instructions (Signed)

## 2021-07-03 ENCOUNTER — Other Ambulatory Visit: Payer: Self-pay | Admitting: Oncology

## 2021-07-03 ENCOUNTER — Other Ambulatory Visit: Payer: Self-pay | Admitting: *Deleted

## 2021-07-03 ENCOUNTER — Other Ambulatory Visit: Payer: Self-pay

## 2021-07-03 ENCOUNTER — Inpatient Hospital Stay: Payer: Medicare PPO

## 2021-07-03 VITALS — BP 130/74 | HR 80 | Temp 97.0°F | Resp 19

## 2021-07-03 DIAGNOSIS — D649 Anemia, unspecified: Secondary | ICD-10-CM

## 2021-07-03 DIAGNOSIS — Z5111 Encounter for antineoplastic chemotherapy: Secondary | ICD-10-CM | POA: Diagnosis not present

## 2021-07-03 DIAGNOSIS — D508 Other iron deficiency anemias: Secondary | ICD-10-CM

## 2021-07-03 MED ORDER — SODIUM CHLORIDE 0.9 % IV SOLN: 200.0000 mg | INTRAVENOUS | Status: DC

## 2021-07-03 MED ORDER — SODIUM CHLORIDE 0.9 % IV SOLN
Freq: Once | INTRAVENOUS | Status: AC
  Filled 2021-07-03: qty 250

## 2021-07-03 MED ORDER — IRON SUCROSE 20 MG/ML IV SOLN
200.0000 mg | Freq: Once | INTRAVENOUS | Status: AC
  Administered 2021-07-03: 200 mg via INTRAVENOUS
  Filled 2021-07-03: qty 10

## 2021-07-03 MED ORDER — CYANOCOBALAMIN 1000 MCG/ML IJ SOLN
1000.0000 ug | Freq: Once | INTRAMUSCULAR | Status: AC
  Administered 2021-07-03: 1000 ug via INTRAMUSCULAR
  Filled 2021-07-03: qty 1

## 2021-07-03 MED ORDER — HEPARIN SOD (PORK) LOCK FLUSH 100 UNIT/ML IV SOLN
500.0000 [IU] | Freq: Once | INTRAVENOUS | Status: AC
  Administered 2021-07-03: 500 [IU] via INTRAVENOUS
  Filled 2021-07-03: qty 5

## 2021-07-08 ENCOUNTER — Ambulatory Visit
Admission: RE | Admit: 2021-07-08 | Discharge: 2021-07-08 | Disposition: A | Discharge status: Home / Self Care | Payer: Medicare PPO | Source: Ambulatory Visit | Attending: Oncology | Admitting: Oncology

## 2021-07-08 ENCOUNTER — Other Ambulatory Visit: Payer: Self-pay

## 2021-07-08 DIAGNOSIS — C259 Malignant neoplasm of pancreas, unspecified: Secondary | ICD-10-CM | POA: Diagnosis not present

## 2021-07-08 DIAGNOSIS — C787 Secondary malignant neoplasm of liver and intrahepatic bile duct: Secondary | ICD-10-CM | POA: Insufficient documentation

## 2021-07-08 IMAGING — CT CT CHEST-ABD-PELV W/O CM
2 of 4 series · 13 of 36 positions shown, 15 images · non-contrast
Comparison: PET-CT [DATE], MRI [DATE]

CLINICAL DATA: Follow-up pancreatic cancer. Diagnosis [DATE]
radiation therapy and chemotherapy complete

EXAM:
CT CHEST, ABDOMEN AND PELVIS WITHOUT CONTRAST
TECHNIQUE: Multidetector CT imaging of the chest, abdomen and pelvis was
performed following the standard protocol without IV contrast.

[Series 2: axials cap 5.00 · axial · 0.67mm/px · z∈[-1508,-958]mm · 10 of 134 slices shown, 12 images]
[im 12/134  mediastinal]
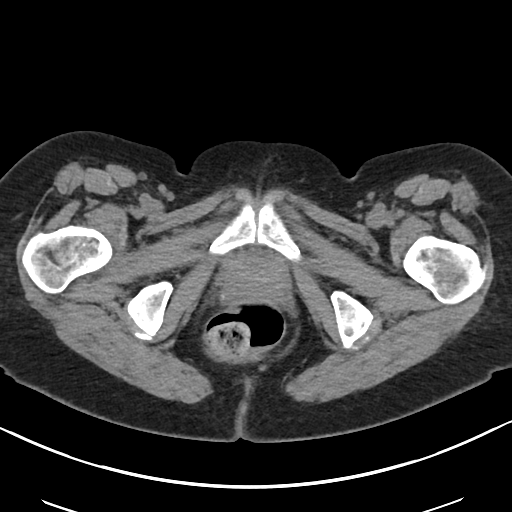
[im 12/134  bone]
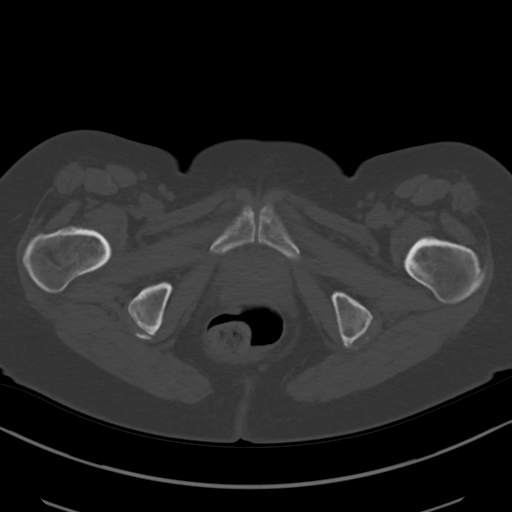
[im 23/134  mediastinal]
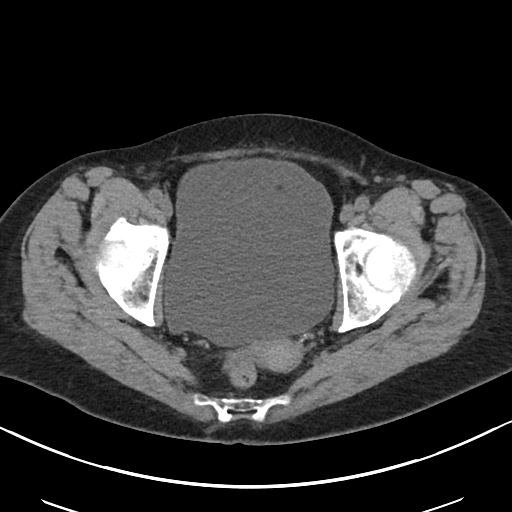
[im 34/134  mediastinal]
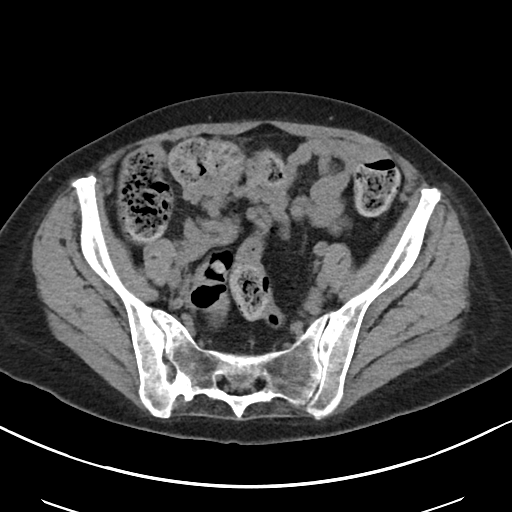
[im 45/134  mediastinal]
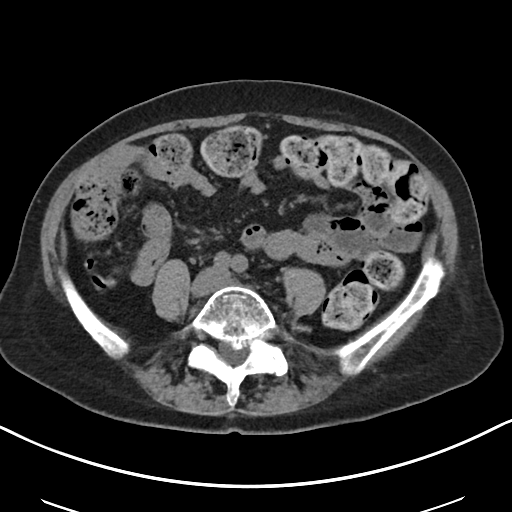
[im 56/134  mediastinal]
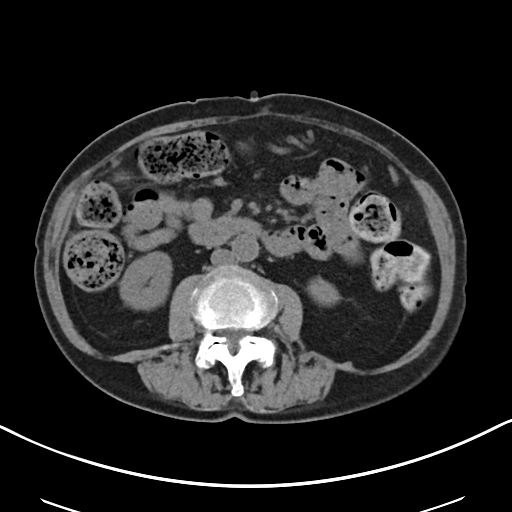
[im 78/134  mediastinal]
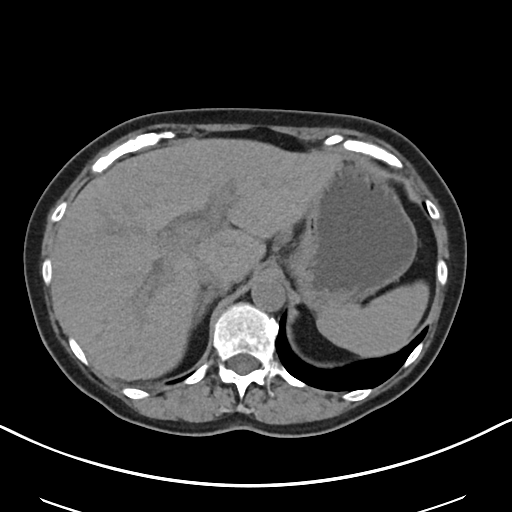
[im 89/134  mediastinal]
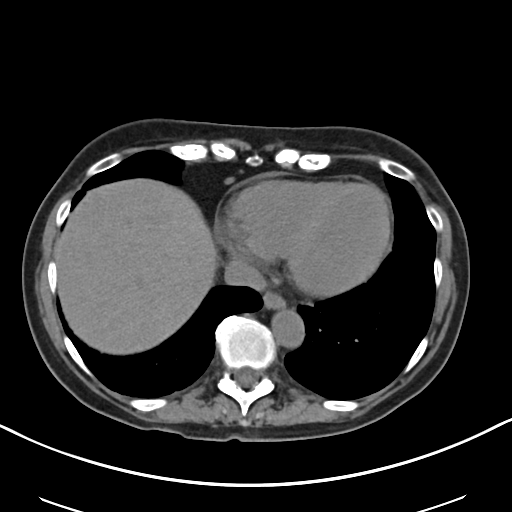
[im 100/134  mediastinal]
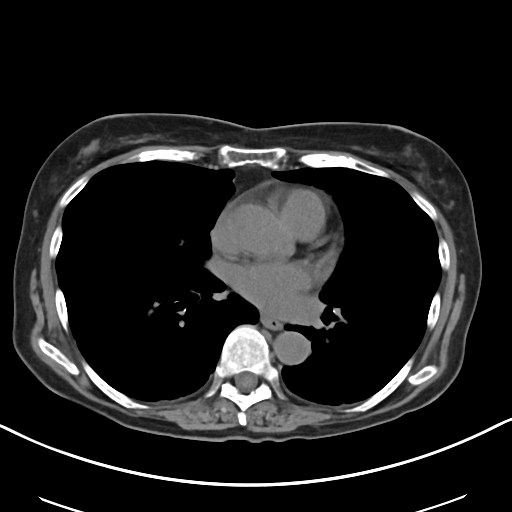
[im 111/134  mediastinal]
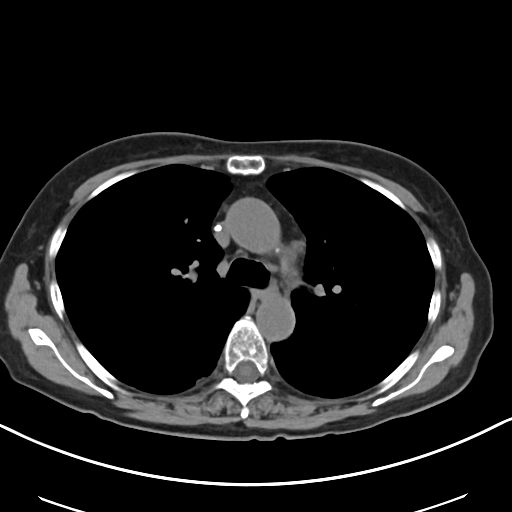
[im 111/134  bone]
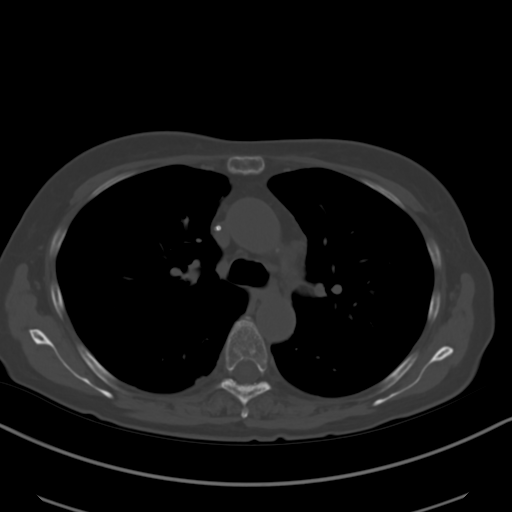
[im 122/134  mediastinal]
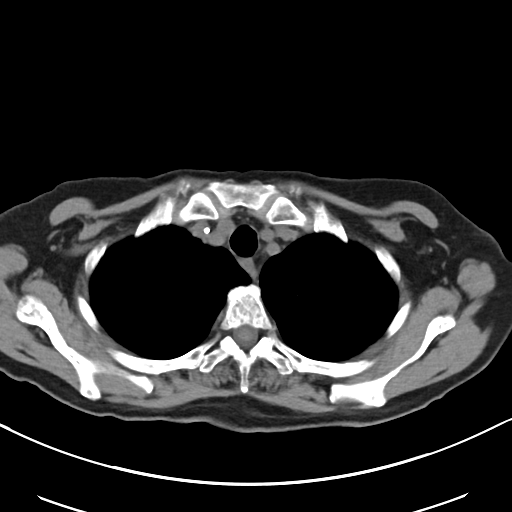

[Series 4: coronals cap 2.00 cor · coronal · 0.67mm/px · 3 of 128 slices shown]
[im 26/128  mediastinal]
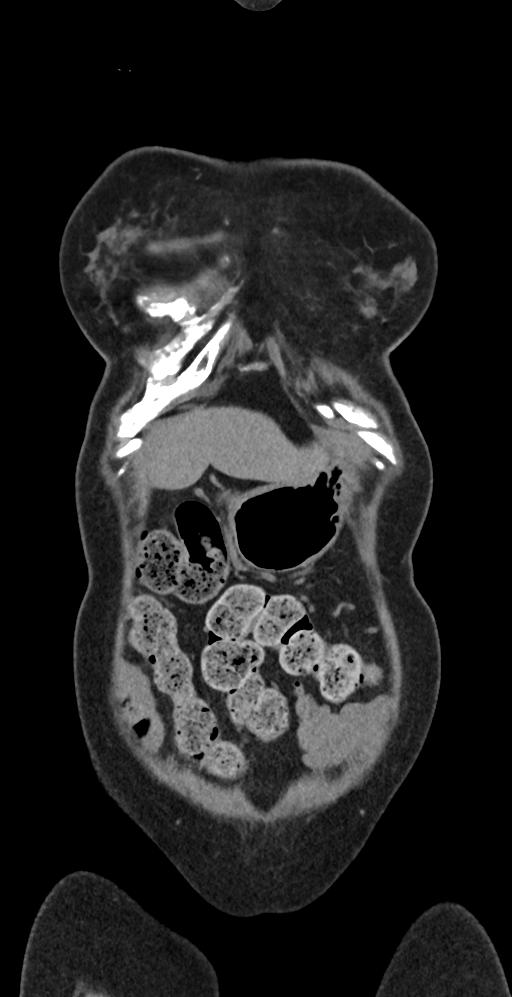
[im 51/128  mediastinal]
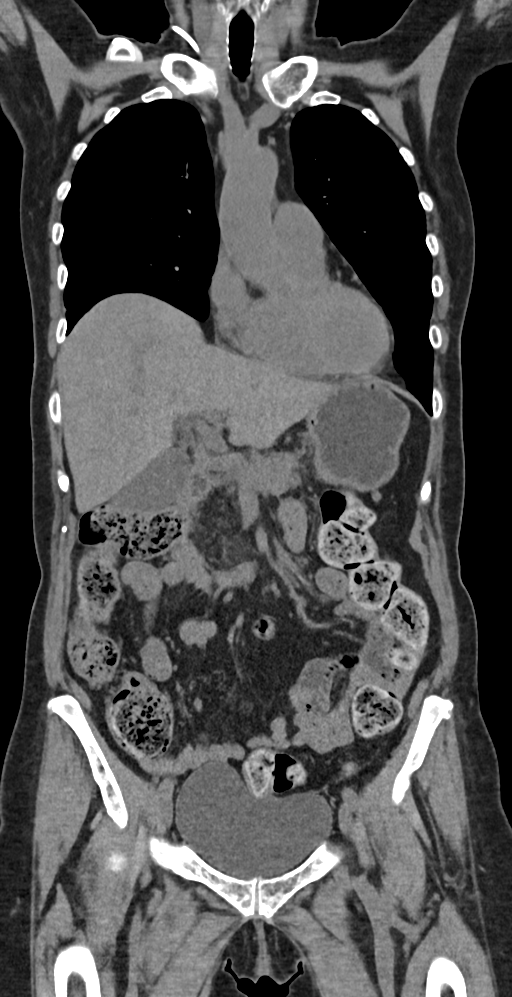
[im 77/128  mediastinal]
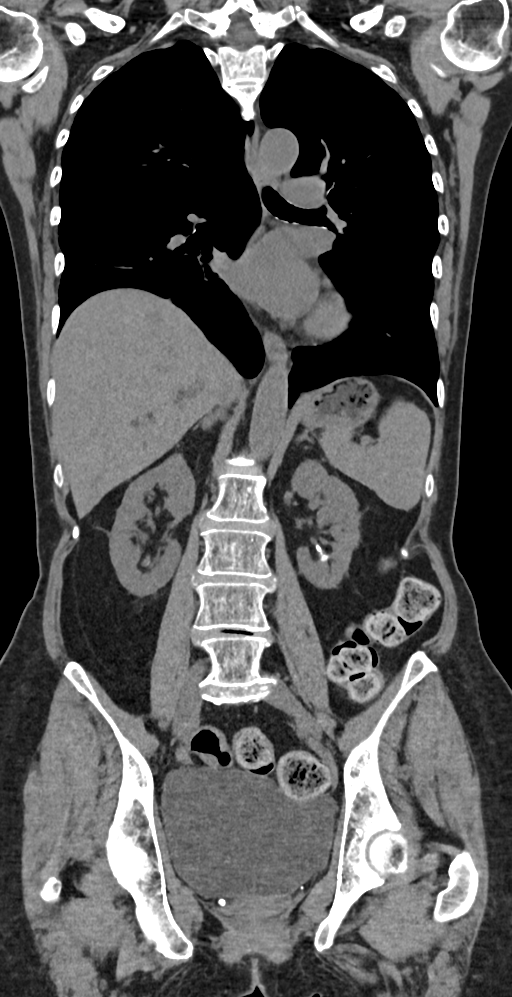

[13 of 36 positions shown; findings below may reference images not displayed]

FINDINGS: CT CHEST FINDINGS

Cardiovascular: Port in the anterior chest wall with tip in distal
SVC.

Mediastinum/Nodes: No axillary or supraclavicular adenopathy. No
mediastinal or hilar adenopathy. No pericardial fluid. Esophagus
normal. Stable enlargement of the RIGHT lobe of thyroid gland which
extends posterior to the trachea in the thoracic inlet

Lungs/Pleura: Mild scarring at the lung bases. No suspicious
pulmonary nodules.

Musculoskeletal: No aggressive osseous lesion.

CT ABDOMEN AND PELVIS FINDINGS

Hepatobiliary: 2 adjacent low-density lesions in the posterior RIGHT
hepatic lobe. 14 mm lesion on image 59/series 2 corresponds to cyst
on comparison MRI. Slightly more inferior 2.2 by 1.5 cm (image
61/series 2 lesion is not placed on comparison MRI or PET scan.

Third, low-density lesion the dome liver measuring 1.1 cm (image
43/series 3) is present on noncontrast PET-CT scan [DATE].
Lesion was not hypermetabolic.

Pancreas: Fullness in the mid body the pancreas similar prior
measuring 3.6 x 2.8 (image 65/2) which compares to 4.9 x 2.4 cm on
PET-CT scan. Difficult to evaluate lesion without IV contrast. No
periportal peripancreatic adenopathy

Spleen: Normal spleen

Adrenals/urinary tract: Adrenal glands and kidneys are normal. The
ureters and bladder normal.

Stomach/Bowel: Stomach, small bowel, appendix, and cecum are normal.
The colon and rectosigmoid colon are normal.

Vascular/Lymphatic: Abdominal aorta is normal caliber. There is no
retroperitoneal or periportal lymphadenopathy. No pelvic
lymphadenopathy.

Reproductive: Uterus and adnexa unremarkable.

Other: Peritoneal or omental metastasis identified

Musculoskeletal: No aggressive osseous lesion.
IMPRESSION: Chest Impression:

1. No evidence thoracic metastasis

Abdomen / Pelvis Impression:

1. Mass in the mid body pancreas is grossly unchanged on noncontrast
CT imaging. Consider MRI evaluation (preferably with contrast).
2. One lesion in the RIGHT hepatic lobe is not clearly placed on
comparison exams. Consider abdominal MRI of the liver for further
evaluation (preferably with contrast).
3. No evidence of peritoneal metastasis

## 2021-07-11 ENCOUNTER — Other Ambulatory Visit: Payer: Self-pay

## 2021-07-11 ENCOUNTER — Encounter: Payer: Self-pay | Admitting: Oncology

## 2021-07-11 ENCOUNTER — Inpatient Hospital Stay: Payer: Medicare PPO

## 2021-07-11 ENCOUNTER — Inpatient Hospital Stay: Payer: Medicare PPO | Admitting: Oncology

## 2021-07-11 VITALS — BP 93/49 | HR 85 | Temp 96.6°F | Resp 18 | Wt 152.2 lb

## 2021-07-11 DIAGNOSIS — C787 Secondary malignant neoplasm of liver and intrahepatic bile duct: Secondary | ICD-10-CM

## 2021-07-11 DIAGNOSIS — N1832 Chronic kidney disease, stage 3b: Secondary | ICD-10-CM

## 2021-07-11 DIAGNOSIS — C259 Malignant neoplasm of pancreas, unspecified: Secondary | ICD-10-CM

## 2021-07-11 DIAGNOSIS — Z7189 Other specified counseling: Secondary | ICD-10-CM

## 2021-07-11 DIAGNOSIS — D649 Anemia, unspecified: Secondary | ICD-10-CM

## 2021-07-11 DIAGNOSIS — D6481 Anemia due to antineoplastic chemotherapy: Secondary | ICD-10-CM

## 2021-07-11 DIAGNOSIS — D631 Anemia in chronic kidney disease: Secondary | ICD-10-CM

## 2021-07-11 DIAGNOSIS — Z5111 Encounter for antineoplastic chemotherapy: Secondary | ICD-10-CM | POA: Diagnosis not present

## 2021-07-11 DIAGNOSIS — T451X5A Adverse effect of antineoplastic and immunosuppressive drugs, initial encounter: Secondary | ICD-10-CM

## 2021-07-11 LAB — COMPREHENSIVE METABOLIC PANEL
ALT: 76 U/L — ABNORMAL HIGH (ref 0–44)
AST: 43 U/L — ABNORMAL HIGH (ref 15–41)
Albumin: 4.2 g/dL (ref 3.5–5.0)
Alkaline Phosphatase: 232 U/L — ABNORMAL HIGH (ref 38–126)
Anion gap: 9 (ref 5–15)
BUN: 38 mg/dL — ABNORMAL HIGH (ref 8–23)
CO2: 27 mmol/L (ref 22–32)
Calcium: 9.7 mg/dL (ref 8.9–10.3)
Chloride: 97 mmol/L — ABNORMAL LOW (ref 98–111)
Creatinine, Ser: 2.23 mg/dL — ABNORMAL HIGH (ref 0.44–1.00)
GFR, Estimated: 22 mL/min — ABNORMAL LOW (ref >60)
Glucose, Bld: 357 mg/dL — ABNORMAL HIGH (ref 70–99)
Potassium: 4.2 mmol/L (ref 3.5–5.1)
Sodium: 133 mmol/L — ABNORMAL LOW (ref 135–145)
Total Bilirubin: 0.6 mg/dL (ref 0.3–1.2)
Total Protein: 8.1 g/dL (ref 6.5–8.1)

## 2021-07-11 LAB — CBC WITH DIFFERENTIAL/PLATELET
Abs Immature Granulocytes: 0.04 10*3/uL (ref 0.00–0.07)
Basophils Absolute: 0 10*3/uL (ref 0.0–0.1)
Basophils Relative: 0 %
Eosinophils Absolute: 0.2 10*3/uL (ref 0.0–0.5)
Eosinophils Relative: 3 %
HCT: 32 % — ABNORMAL LOW (ref 36.0–46.0)
Hemoglobin: 9.9 g/dL — ABNORMAL LOW (ref 12.0–15.0)
Immature Granulocytes: 0 %
Lymphocytes Relative: 15 %
Lymphs Abs: 1.4 10*3/uL (ref 0.7–4.0)
MCH: 29.3 pg (ref 26.0–34.0)
MCHC: 30.9 g/dL (ref 30.0–36.0)
MCV: 94.7 fL (ref 80.0–100.0)
Monocytes Absolute: 0.7 10*3/uL (ref 0.1–1.0)
Monocytes Relative: 7 %
Neutro Abs: 7.1 10*3/uL (ref 1.7–7.7)
Neutrophils Relative %: 75 %
Platelets: 274 10*3/uL (ref 150–400)
RBC: 3.38 MIL/uL — ABNORMAL LOW (ref 3.87–5.11)
RDW: 15.9 % — ABNORMAL HIGH (ref 11.5–15.5)
WBC: 9.5 10*3/uL (ref 4.0–10.5)
nRBC: 0 % (ref 0.0–0.2)

## 2021-07-11 LAB — VITAMIN B12: Vitamin B-12: 849 pg/mL (ref 180–914)

## 2021-07-11 NOTE — Progress Notes (Signed)
Pt c/o abdomen pain going on three weeks. 5/10.

## 2021-07-12 ENCOUNTER — Encounter: Payer: Self-pay | Admitting: Oncology

## 2021-07-12 NOTE — Progress Notes (Signed)
Hematology/Oncology Consult note [facility]  [phone] [phone]  Patient Care Team: Dion Body, MD as PCP - General (Family Medicine) Clent Jacks, RN as Oncology Nurse Navigator   Name of the patient: Rhonda Day  [id number]  Jun 18, 1941   Date of visit: 07/12/21  Diagnosis- metastatic pancreatic cancer with liver metastases  Chief complaint/ Reason for visit-her CT scan results and further management  Heme/Onc history:  Patient is a 80 year old female who underwent ultrasound abdomen in December 2021 which showed hypoechoic mass in the pancreatic tail measuring 2.1 x 1.8 x 4.5 cm which was not seen on prior CT scans on MRI exams a year ago.  This was followed by a CT abdomen and pelvis with contrast which showed ill-defined hypervascular lesion in segment 8 of liver measuring 1.6 x 1.4 cm concerning for metastases.  2 other lesions 1.7 x 1 cm and 1.5 x 0.8 cm were also noted.  Large mass in the body of pancreas measuring 5.6 x 2.2 x 2.9 cm.  The lesion causes obstruction of the pancreatic duct resulting in ductal dilatation throughout the tail of the pancreas.  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  Splenic vein appears chronically occluded.  Lesion also comes in contact with splenic artery which remains patent.  This was followed by MRI abdomen which again showed similar findings.  Multiple foci of restricted diffusion with hyperenhancement noted in the right hepatic lobe.  Prominent portocaval lymph node seen for local nodal metastases along with a 5.3 cm body and tail pancreatic mass   Case was discussed at tumor board and lab was liver biopsy 2 weeks after patient initial Covid test that was positive.  However at the day of the biopsy this lesion was in close association with other vascular structures and given the  potential higher risks liver biopsy was aborted and EUS was recommended.  She is currently on Cardizem.     Patient had a EUS at [place] By Dr. Mont Dutton. Pathology was consistent with adenocarcinoma. Patient had a PET scan on 12/11/2020 which showed a hypermetabolic lesion in the medial aspect of the liver with an SUV of 4.2 there is a subtle hypodensity on the comparison CT measuring 9 mm at this level. The hypermetabolic lesion in the dome of the liver corresponds to the enhancing lesion on the comparison MRI. 3.4 x 2.7 cm lesion in the mid body of pancreas with an SUV of 5.1. No hypermetabolic peripancreatic lymph nodes.    Patient received 1 cycle of modified FOLFIRINOX chemotherapy on 12/30/2020.  Following that patient had significant nausea vomiting fatigue and declining performance status requiring chemotherapy to be put on hold.  Patient is currently receiving gemcitabine Abraxane chemotherapy 2 weeks on and 1 week off     Chemotherapy currently complicated by worsening anemia and CKD and therefore on hold since August 2022    Interval history-patient feels some improvement in her energy levels after stopping chemotherapy about 4 weeks ago.  She has also received IV iron.  Reports a new subcutaneous nodule around her umbilicus.  States that it is painful especially when she sits.  ECOG PS- 1 Pain scale- 4 Opioid associated constipation- no  Review of systems- Review of Systems  Constitutional:  Positive for malaise/fatigue. Negative for chills, fever and weight loss.  HENT:  Negative for congestion, ear discharge and nosebleeds.  Eyes:  Negative for blurred vision.  Respiratory:  Negative for cough, hemoptysis, sputum production, shortness of breath and wheezing.   Cardiovascular:  Negative for chest pain, palpitations, orthopnea and claudication.  Gastrointestinal:  Positive for abdominal pain. Negative for blood in stool, constipation, diarrhea, heartburn, melena, nausea and vomiting.   Genitourinary:  Negative for dysuria, flank pain, frequency, hematuria and urgency.  Musculoskeletal:  Negative for back pain, joint pain and myalgias.  Skin:  Negative for rash.  Neurological:  Negative for dizziness, tingling, focal weakness, seizures, weakness and headaches.  Endo/Heme/Allergies:  Does not bruise/bleed easily.  Psychiatric/Behavioral:  Negative for depression and suicidal ideas. The patient does not have insomnia.       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   B12 deficiency 09/30/2017   Bladder infection    Cancer associated pain 01/10/2021   Chronic allergic conjunctivitis 12/02/2020   Chronic cystitis 01/26/2017   Chronic pain syndrome 12/16/2017   Controlled type 2 diabetes mellitus with chronic kidney disease, without long-term current use of insulin ([place]) 01/09/2021   Controlled type 2 diabetes mellitus without complication ([place]) 01/25/1855   Convalescence following chemotherapy 01/10/2021   Degeneration of lumbar intervertebral disc 12/16/2017   Diabetes mellitus without complication ([place])    Dysuria 12/23/2016   Essential hypertension 11/15/2015   Family history of kidney cancer    Family history of kidney cancer    Family history of lung cancer    Family history of lung cancer    Family history of stomach cancer    Family history of stomach cancer    Family history of throat cancer    Family history of throat cancer    Female stress incontinence 11/11/2012   Genetic testing 01/28/2021   Negative genetic testing. No pathogenic variants identified on the [facility] Multi-Cancer+RNA panel. VUS in [employer] called c.1978G>A and VUS in [employer] called c.983C>T were identified. The  report date its 01/25/2021.  The Multi-Cancer Panel + RNA offered by [facility] includes sequencing and/or deletion duplication testing of the following 84 genes: AIP, ALK, APC, ATM, AXIN2,BAP1,  BARD1, BLM, BMPR1A, BRC   Goals of care, counseling/discussion 12/13/2020   History of atrial fibrillation 04/09/2016   History of chronic health problem 05/11/2016   Hyperlipidemia, mixed 12/09/2020   Hypertension    Incomplete bladder emptying 11/16/2012   Low back pain 11/11/2012   Low bladder compliance 11/11/2012   Lumbar post-laminectomy syndrome 12/16/2017   Medicare annual wellness visit, subsequent 09/29/2016   Menopause syndrome 12/24/2016   Other specified symptom associated with female genital organs 11/11/2012   Pancreatic cancer [facility])    with liver mass   Pancreatic cancer metastasized to liver ([place]) 12/13/2020   Pancreatic cancer metastasized to liver ([place]) 12/13/2020   Paroxysmal A-fib ([place]) 12/09/2020   Stage 3b chronic kidney disease ([place]) 03/23/2020   Symptoms involving urinary system 11/11/2012   Type 2 diabetes mellitus ([place]) 11/15/2015   Type 2 diabetes mellitus with diabetic chronic kidney disease ([place]) 03/23/2020   Vaginal spotting 05/11/2017   Vasomotor rhinitis 12/02/2020     Past Surgical History:  Procedure Laterality Date   BACK SURGERY     PORTA CATH INSERTION N/A 12/27/2020  Procedure: PORTA CATH INSERTION;  Surgeon: Algernon Huxley, MD;  Location: [facility];  Service: Cardiovascular;  Laterality: N/A;   TUBAL LIGATION      Social History   Socioeconomic History   Marital status: Married    Spouse name: Not on file   Number of children: Not on file   Years of education: Not on file   Highest education level: Not on file  Occupational History   Not on file  Tobacco Use   Smoking status: Former    Types: Cigarettes    Quit date: 11/20/1979    Years since quitting: 41.6   Smokeless tobacco: Never  Vaping Use   Vaping Use: Never used  Substance and Sexual Activity    Alcohol use: No    Alcohol/week: 0.0 standard drinks   Drug use: No   Sexual activity: Never  Other Topics Concern   Not on file  Social History Narrative   Not on file   Social Determinants of Health   Financial Resource Strain: Not on file  Food Insecurity: Not on file  Transportation Needs: Not on file  Physical Activity: Not on file  Stress: Not on file  Social Connections: Not on file  Intimate Partner Violence: Not on file    Family History  Problem Relation Age of Onset   Kidney cancer Father 69   COPD Mother    Stomach cancer Maternal Aunt    Throat cancer Maternal Uncle    Liver cancer Paternal Uncle    Esophageal cancer Paternal Grandfather    Lung cancer Maternal Aunt    Lung cancer Maternal Uncle    Lung cancer Cousin    Kidney cancer Cousin 44   Testicular cancer Nephew        dx 20s   Breast cancer Neg Hx      Current Outpatient Medications:    azelastine (ASTELIN) 0.1 % nasal spray, Place into both nostrils daily as needed., Disp: , Rfl:    azelastine (OPTIVAR) 0.05 % ophthalmic solution, Place into both eyes daily as needed., Disp: , Rfl: 3   diltiazem (TIAZAC) 180 MG 24 hr capsule, Take by mouth daily., Disp: , Rfl:    ferrous sulfate 325 (65 FE) MG EC tablet, Take by mouth., Disp: , Rfl:    HYDROcodone-acetaminophen (NORCO) 5-325 MG tablet, Take 1 tablet by mouth every 6 (six) hours as needed for moderate pain., Disp: 60 tablet, Rfl: 0   LORazepam (ATIVAN) 0.5 MG tablet, Take 1 tablet by mouth daily., Disp: , Rfl:    morphine (MS CONTIN) 15 MG 12 hr tablet, Take 1 tablet (15 mg total) by mouth every 12 (twelve) hours., Disp: 60 tablet, Rfl: 0   ONE TOUCH ULTRA TEST test strip, , Disp: , Rfl: 1   ONETOUCH DELICA LANCETS 28Z MISC, 1 each by XX route as directed. Check CBG's fasting once daily. Dx: E11.9, Disp: , Rfl:    pantoprazole (PROTONIX) 20 MG tablet, Take 1 tablet (20 mg total) by mouth daily., Disp: 30 tablet, Rfl: 2   traZODone (DESYREL) 50  MG tablet, Take 0.5-1 tablets (25-50 mg total) by mouth at bedtime as needed for sleep., Disp: 30 tablet, Rfl: 3   gabapentin (NEURONTIN) 100 MG capsule, Take 1 capsule (100 mg total) by mouth at bedtime. (Patient not taking: Reported on 07/11/2021), Disp: 30 capsule, Rfl: 2   glimepiride (AMARYL) 2 MG tablet, Take 2 mg by mouth daily with breakfast. (Patient not taking: Reported on 07/11/2021),  Disp: , Rfl:    losartan (COZAAR) 100 MG tablet, Take 100 mg by mouth daily. (Patient not taking: Reported on 07/11/2021), Disp: , Rfl:    losartan (COZAAR) 25 MG tablet, Take 1 tablet by mouth daily. (Patient not taking: Reported on 07/11/2021), Disp: , Rfl:    sertraline (ZOLOFT) 50 MG tablet, sertraline 50 mg tablet (Patient not taking: Reported on 07/11/2021), Disp: , Rfl:  No current facility-administered medications for this visit.  Facility-Administered Medications Ordered in Other Visits:    sodium chloride flush (NS) 0.9 % injection 10 mL, 10 mL, Intravenous, PRN, Sindy Guadeloupe, MD, 10 mL at 03/04/21 1045  Physical exam:  Vitals:   07/11/21 1126  BP: (!) 93/49  Pulse: 85  Resp: 18  Temp: (!) 96.6 F (35.9 C)  SpO2: 97%  Weight: 152 lb 3.2 oz (69 kg)   Physical Exam Constitutional:      General: She is not in acute distress. Cardiovascular:     Rate and Rhythm: Normal rate and regular rhythm.     Heart sounds: Normal heart sounds.  Pulmonary:     Effort: Pulmonary effort is normal.  Abdominal:     General: Bowel sounds are normal.     Palpations: Abdomen is soft.     Comments: Palpable subcutaneous nodule at the superior end of the umbilicus concerning for subcutaneous metastases  Skin:    General: Skin is warm and dry.  Neurological:     Mental Status: She is alert and oriented to person, place, and time.     CMP Latest Ref Rng & Units 07/11/2021  Glucose 70 - 99 mg/dL 357(H)  BUN 8 - 23 mg/dL 38(H)  Creatinine 0.44 - 1.00 mg/dL 2.23(H)  Sodium 135 - 145 mmol/L 133(L)   Potassium 3.5 - 5.1 mmol/L 4.2  Chloride 98 - 111 mmol/L 97(L)  CO2 22 - 32 mmol/L 27  Calcium 8.9 - 10.3 mg/dL 9.7  Total Protein 6.5 - 8.1 g/dL 8.1  Total Bilirubin 0.3 - 1.2 mg/dL 0.6  Alkaline Phos 38 - 126 U/L 232(H)  AST 15 - 41 U/L 43(H)  ALT 0 - 44 U/L 76(H)   CBC Latest Ref Rng & Units 07/11/2021  WBC 4.0 - 10.5 K/uL 9.5  Hemoglobin 12.0 - 15.0 g/dL 9.9(L)  Hematocrit 36.0 - 46.0 % 32.0(L)  Platelets 150 - 400 K/uL 274    No images are attached to the encounter.  CT CHEST ABDOMEN PELVIS WO CONTRAST  Result Date: 07/09/2021 CLINICAL DATA:  Follow-up pancreatic cancer. Diagnosis January 2022 radiation therapy and chemotherapy complete EXAM: CT CHEST, ABDOMEN AND PELVIS WITHOUT CONTRAST TECHNIQUE: Multidetector CT imaging of the chest, abdomen and pelvis was performed following the standard protocol without IV contrast. COMPARISON:  PET-CT 04/01/2021, MRI 618 1,022 FINDINGS: CT CHEST FINDINGS Cardiovascular: Port in the anterior chest wall with tip in distal SVC. Mediastinum/Nodes: No axillary or supraclavicular adenopathy. No mediastinal or hilar adenopathy. No pericardial fluid. Esophagus normal. Stable enlargement of the RIGHT lobe of thyroid gland which extends posterior to the trachea in the thoracic inlet Lungs/Pleura: Mild scarring at the lung bases. No suspicious pulmonary nodules. Musculoskeletal: No aggressive osseous lesion. CT ABDOMEN AND PELVIS FINDINGS Hepatobiliary: 2 adjacent low-density lesions in the posterior RIGHT hepatic lobe. 14 mm lesion on image 59/series 2 corresponds to cyst on comparison MRI. Slightly more inferior 2.2 by 1.5 cm (image 61/series 2 lesion is not placed on comparison MRI or PET scan. Third, low-density lesion the dome liver measuring 1.1  cm (image 43/series 3) is present on noncontrast PET-CT scan 04/02/2019. Lesion was not hypermetabolic. Pancreas: Fullness in the mid body the pancreas similar prior measuring 3.6 x 2.8 (image 65/2) which compares  to 4.9 x 2.4 cm on PET-CT scan. Difficult to evaluate lesion without IV contrast. No periportal peripancreatic adenopathy Spleen: Normal spleen Adrenals/urinary tract: Adrenal glands and kidneys are normal. The ureters and bladder normal. Stomach/Bowel: Stomach, small bowel, appendix, and cecum are normal. The colon and rectosigmoid colon are normal. Vascular/Lymphatic: Abdominal aorta is normal caliber. There is no retroperitoneal or periportal lymphadenopathy. No pelvic lymphadenopathy. Reproductive: Uterus and adnexa unremarkable. Other: Peritoneal or omental metastasis identified Musculoskeletal: No aggressive osseous lesion. IMPRESSION: Chest Impression: 1. No evidence thoracic metastasis Abdomen / Pelvis Impression: 1. Mass in the mid body pancreas is grossly unchanged on noncontrast CT imaging. Consider MRI evaluation (preferably with contrast). 2. One lesion in the RIGHT hepatic lobe is not clearly placed on comparison exams. Consider abdominal MRI of the liver for further evaluation (preferably with contrast). 3. No evidence of peritoneal metastasis Electronically Signed   By: Suzy Bouchard M.D.   On: 07/09/2021 11:45     Assessment and plan- Patient is a 80 y.o. female with metastatic pancreatic cancer and liver metastases here to discuss CT scan results and further management  Patient last received gemcitabine Abraxane chemotherapy on 05/20/2021.  Following that her chemotherapy was held due to worsening kidney functions as well as anemia.  Her hemoglobin had dropped down to 7.7.  I was concerned about using EPO for anemia given the risk of thromboembolic events especially in the setting of pancreatic cancer.  She received 5 doses of Venofer.  Today her hemoglobin has improved to 9.9 and is closer to her baseline.  Renal functions have also improved from 2.6-2.2 although her baseline creatinine is around 1.7.  CA 19-9 from today is pending.  I have not checked her iron studies today since her iron  infusion just got done about a week ago.  B12 levels are normal.  CT chest abdomen and pelvis without contrast from 07/08/2021 showed no significant change in the mass in the mid body of the pancreas.  No evidence of peritoneal metastases.  Metastatic hepatic lesion which was 10 mm on prior PET scan was difficult to visualize on the present scan.  Patient has now developed a subcutaneous nodule at the superior end of the umbilicus which is concerning for new area of metastatic disease.  I will hold off on biopsy of this lesion at this time.  For symptomatic pain relief I also suggested trying lidocaine patch in addition to as needed oxycodone that she is currently taking.  Given that her kidney functions and anemia has improved we will plan to restart single agent gemcitabine chemotherapy at this time starting next week.  If her counts and kidney functions remained stable in we will consider adding Abraxane again down the line.  If overall patient does not tolerate chemotherapy well best supportive care/hospice also remains an option.  For now patient wishes to continue systemic chemotherapy.  We again discussed that overall outlook of metastatic pancreatic cancer is in general poor and chemotherapy will likely add months but not years to survival.  Patient and her husband verbalized understanding of the plan gemcitabine in 1 week.  Port labs CBC with differential, CMP CA 19 9 and see Dr. Janese Banks in 2 weeks for cycle 7-day 8 of gemcitabine chemotherapy   Visit Diagnosis 1. Antineoplastic chemotherapy induced anemia  2. Pancreatic cancer metastasized to liver ([place])   3. Goals of care, counseling/discussion   4. Anemia in stage 3b chronic kidney disease ([place])      Dr. Randa Evens, MD, MPH [facility] at [facility] [phone] 07/12/2021 10:20 PM

## 2021-07-15 LAB — CANCER ANTIGEN 19-9: CA 19-9: 8595 U/mL — ABNORMAL HIGH (ref 0–35)

## 2021-07-16 ENCOUNTER — Inpatient Hospital Stay: Payer: Medicare PPO

## 2021-07-16 ENCOUNTER — Other Ambulatory Visit: Payer: Self-pay

## 2021-07-16 VITALS — BP 131/75 | HR 65 | Temp 96.7°F | Resp 16

## 2021-07-16 DIAGNOSIS — C787 Secondary malignant neoplasm of liver and intrahepatic bile duct: Secondary | ICD-10-CM

## 2021-07-16 DIAGNOSIS — Z5111 Encounter for antineoplastic chemotherapy: Secondary | ICD-10-CM | POA: Diagnosis not present

## 2021-07-16 MED ORDER — PROCHLORPERAZINE MALEATE 10 MG PO TABS
10.0000 mg | ORAL_TABLET | Freq: Once | ORAL | Status: AC
  Administered 2021-07-16: 10 mg via ORAL
  Filled 2021-07-16: qty 1

## 2021-07-16 MED ORDER — SODIUM CHLORIDE 0.9 % IV SOLN
1400.0000 mg | Freq: Once | INTRAVENOUS | Status: AC
  Administered 2021-07-16: 1400 mg via INTRAVENOUS
  Filled 2021-07-16: qty 26.3

## 2021-07-16 MED ORDER — SODIUM CHLORIDE 0.9 % IV SOLN
Freq: Once | INTRAVENOUS | Status: AC
  Filled 2021-07-16: qty 250

## 2021-07-16 MED ORDER — HEPARIN SOD (PORK) LOCK FLUSH 100 UNIT/ML IV SOLN
500.0000 [IU] | Freq: Once | INTRAVENOUS | Status: AC | PRN
  Administered 2021-07-16: 500 [IU]
  Filled 2021-07-16: qty 5

## 2021-07-16 NOTE — Progress Notes (Signed)
Labs from 07/11/2021, Cr 2.23, Alk Phos 232, and ALT 76. Per Dr. Janese Banks, okay to proceed with treatment.

## 2021-07-16 NOTE — Patient Instructions (Signed)
[facility] [place] [facility]  Discharge Instructions: Thank you for choosing [facility] to provide your oncology and hematology care.  If you have a lab appointment with the Cancer Center, please go directly to the Cancer Center and check in at the registration area.  Wear comfortable clothing and clothing appropriate for easy access to any Portacath or PICC line.   We strive to give you quality time with your provider. You may need to reschedule your appointment if you arrive late (15 or more minutes).  Arriving late affects you and other patients whose appointments are after yours.  Also, if you miss three or more appointments without notifying the office, you may be dismissed from the clinic at the provider's discretion.      For prescription refill requests, have your pharmacy contact our office and allow 72 hours for refills to be completed.    Today you received the following chemotherapy and/or immunotherapy agents Gemzar   To help prevent nausea and vomiting after your treatment, we encourage you to take your nausea medication as directed.  BELOW ARE SYMPTOMS THAT SHOULD BE REPORTED IMMEDIATELY: *FEVER GREATER THAN 100.4 F (38 C) OR HIGHER *CHILLS OR SWEATING *NAUSEA AND VOMITING THAT IS NOT CONTROLLED WITH YOUR NAUSEA MEDICATION *UNUSUAL SHORTNESS OF BREATH *UNUSUAL BRUISING OR BLEEDING *URINARY PROBLEMS (pain or burning when urinating, or frequent urination) *BOWEL PROBLEMS (unusual diarrhea, constipation, pain near the anus) TENDERNESS IN MOUTH AND THROAT WITH OR WITHOUT PRESENCE OF ULCERS (sore throat, sores in mouth, or a toothache) UNUSUAL RASH, SWELLING OR PAIN  UNUSUAL VAGINAL DISCHARGE OR ITCHING   Items with * indicate a potential emergency and should be followed up as soon as possible or go to the Emergency Department if any problems should occur.  Please show the CHEMOTHERAPY ALERT CARD or IMMUNOTHERAPY ALERT CARD at check-in to the  Emergency Department and triage nurse.  Should you have questions after your visit or need to cancel or reschedule your appointment, please contact [facility] C[facility] [place] [facility]  [phone] and follow the prompts.  Office hours are 8:00 a.m. to 4:30 p.m. Monday - Friday. Please note that voicemails left after 4:00 p.m. may not be returned until the following business day.  We are closed weekends and major holidays. You have access to a nurse at all times for urgent questions. Please call the main number to the clinic [phone] and follow the prompts.  For any non-urgent questions, you may also contact your provider using MyChart. We now offer e-Visits for anyone 18 and older to request care online for non-urgent symptoms. For details visit [email or web address].   Also download the MyChart app! Go to the app store, search "MyChart", open the app, select [place], and log in with your MyChart username and password.  Due to Covid, a mask is required upon entering the hospital/clinic. If you do not have a mask, one will be given to you upon arrival. For doctor visits, patients may have 1 support person aged 18 or older with them. For treatment visits, patients cannot have anyone with them due to current Covid guidelines and our immunocompromised population.  

## 2021-07-17 ENCOUNTER — Other Ambulatory Visit: Payer: Self-pay | Admitting: *Deleted

## 2021-07-17 MED ORDER — HYDROCODONE-ACETAMINOPHEN 5-325 MG PO TABS: 1.0000 | ORAL_TABLET | Freq: Four times a day (QID) | ORAL | 0 refills | Status: DC | PRN

## 2021-07-17 NOTE — Telephone Encounter (Signed)
Patient called reporting that she has a new lump at her umbilicus that is painful and is asking what to do about it since her tumor marker has gone up and that she also needs a refill of her Hydrocodone. Please advise Notes Component Ref Range & Units 6 d ago  (07/11/21) 1 mo ago  (05/20/21) 2 mo ago  (04/29/21) 3 mo ago  (04/15/21) 4 mo ago  (03/18/21) 4 mo ago  (03/04/21) 7 mo ago  (12/02/20)  CA 19-9 0 - 35 U/mL 8,595 High   642 High  CM  882 High  CM

## 2021-07-20 ENCOUNTER — Other Ambulatory Visit: Payer: Self-pay | Admitting: Oncology

## 2021-07-21 ENCOUNTER — Other Ambulatory Visit: Payer: Self-pay

## 2021-07-21 ENCOUNTER — Encounter: Payer: Self-pay | Admitting: Oncology

## 2021-07-21 DIAGNOSIS — C259 Malignant neoplasm of pancreas, unspecified: Secondary | ICD-10-CM

## 2021-07-23 ENCOUNTER — Encounter: Payer: Self-pay | Admitting: Oncology

## 2021-07-23 ENCOUNTER — Inpatient Hospital Stay: Payer: Medicare PPO

## 2021-07-23 ENCOUNTER — Telehealth: Payer: Self-pay

## 2021-07-23 ENCOUNTER — Other Ambulatory Visit: Payer: Self-pay | Admitting: *Deleted

## 2021-07-23 ENCOUNTER — Inpatient Hospital Stay: Payer: Medicare PPO | Admitting: Oncology

## 2021-07-23 VITALS — BP 129/62 | HR 81 | Temp 97.7°F | Resp 16 | Ht 69.0 in | Wt 150.5 lb

## 2021-07-23 DIAGNOSIS — Z808 Family history of malignant neoplasm of other organs or systems: Secondary | ICD-10-CM | POA: Insufficient documentation

## 2021-07-23 DIAGNOSIS — C787 Secondary malignant neoplasm of liver and intrahepatic bile duct: Secondary | ICD-10-CM

## 2021-07-23 DIAGNOSIS — Z8043 Family history of malignant neoplasm of testis: Secondary | ICD-10-CM | POA: Insufficient documentation

## 2021-07-23 DIAGNOSIS — C259 Malignant neoplasm of pancreas, unspecified: Secondary | ICD-10-CM

## 2021-07-23 DIAGNOSIS — Z8051 Family history of malignant neoplasm of kidney: Secondary | ICD-10-CM | POA: Insufficient documentation

## 2021-07-23 DIAGNOSIS — C252 Malignant neoplasm of tail of pancreas: Secondary | ICD-10-CM | POA: Insufficient documentation

## 2021-07-23 DIAGNOSIS — Z510 Encounter for antineoplastic radiation therapy: Secondary | ICD-10-CM | POA: Diagnosis not present

## 2021-07-23 DIAGNOSIS — Z800 Family history of malignant neoplasm of digestive organs: Secondary | ICD-10-CM | POA: Insufficient documentation

## 2021-07-23 DIAGNOSIS — Z5111 Encounter for antineoplastic chemotherapy: Secondary | ICD-10-CM | POA: Insufficient documentation

## 2021-07-23 DIAGNOSIS — Z87891 Personal history of nicotine dependence: Secondary | ICD-10-CM | POA: Insufficient documentation

## 2021-07-23 DIAGNOSIS — E1122 Type 2 diabetes mellitus with diabetic chronic kidney disease: Secondary | ICD-10-CM | POA: Insufficient documentation

## 2021-07-23 DIAGNOSIS — I129 Hypertensive chronic kidney disease with stage 1 through stage 4 chronic kidney disease, or unspecified chronic kidney disease: Secondary | ICD-10-CM | POA: Insufficient documentation

## 2021-07-23 DIAGNOSIS — T451X5A Adverse effect of antineoplastic and immunosuppressive drugs, initial encounter: Secondary | ICD-10-CM

## 2021-07-23 DIAGNOSIS — N1832 Chronic kidney disease, stage 3b: Secondary | ICD-10-CM

## 2021-07-23 DIAGNOSIS — D6481 Anemia due to antineoplastic chemotherapy: Secondary | ICD-10-CM

## 2021-07-23 DIAGNOSIS — Z801 Family history of malignant neoplasm of trachea, bronchus and lung: Secondary | ICD-10-CM | POA: Insufficient documentation

## 2021-07-23 LAB — CBC WITH DIFFERENTIAL/PLATELET
Abs Immature Granulocytes: 0.02 10*3/uL (ref 0.00–0.07)
Basophils Absolute: 0 10*3/uL (ref 0.0–0.1)
Basophils Relative: 0 %
Eosinophils Absolute: 0.1 10*3/uL (ref 0.0–0.5)
Eosinophils Relative: 2 %
HCT: 30.3 % — ABNORMAL LOW (ref 36.0–46.0)
Hemoglobin: 9.6 g/dL — ABNORMAL LOW (ref 12.0–15.0)
Immature Granulocytes: 0 %
Lymphocytes Relative: 34 %
Lymphs Abs: 1.6 10*3/uL (ref 0.7–4.0)
MCH: 29.8 pg (ref 26.0–34.0)
MCHC: 31.7 g/dL (ref 30.0–36.0)
MCV: 94.1 fL (ref 80.0–100.0)
Monocytes Absolute: 0.5 10*3/uL (ref 0.1–1.0)
Monocytes Relative: 11 %
Neutro Abs: 2.6 10*3/uL (ref 1.7–7.7)
Neutrophils Relative %: 53 %
Platelets: 226 10*3/uL (ref 150–400)
RBC: 3.22 MIL/uL — ABNORMAL LOW (ref 3.87–5.11)
RDW: 14.4 % (ref 11.5–15.5)
WBC: 4.8 10*3/uL (ref 4.0–10.5)
nRBC: 0 % (ref 0.0–0.2)

## 2021-07-23 LAB — COMPREHENSIVE METABOLIC PANEL
ALT: 83 U/L — ABNORMAL HIGH (ref 0–44)
AST: 41 U/L (ref 15–41)
Albumin: 3.9 g/dL (ref 3.5–5.0)
Alkaline Phosphatase: 371 U/L — ABNORMAL HIGH (ref 38–126)
Anion gap: 10 (ref 5–15)
BUN: 31 mg/dL — ABNORMAL HIGH (ref 8–23)
CO2: 26 mmol/L (ref 22–32)
Calcium: 9.9 mg/dL (ref 8.9–10.3)
Chloride: 97 mmol/L — ABNORMAL LOW (ref 98–111)
Creatinine, Ser: 1.77 mg/dL — ABNORMAL HIGH (ref 0.44–1.00)
GFR, Estimated: 29 mL/min — ABNORMAL LOW (ref >60)
Glucose, Bld: 314 mg/dL — ABNORMAL HIGH (ref 70–99)
Potassium: 4 mmol/L (ref 3.5–5.1)
Sodium: 133 mmol/L — ABNORMAL LOW (ref 135–145)
Total Bilirubin: 0.6 mg/dL (ref 0.3–1.2)
Total Protein: 7.8 g/dL (ref 6.5–8.1)

## 2021-07-23 MED ORDER — PROCHLORPERAZINE MALEATE 10 MG PO TABS
10.0000 mg | ORAL_TABLET | Freq: Once | ORAL | Status: AC
  Administered 2021-07-23: 10 mg via ORAL
  Filled 2021-07-23: qty 1

## 2021-07-23 MED ORDER — HEPARIN SOD (PORK) LOCK FLUSH 100 UNIT/ML IV SOLN
500.0000 [IU] | Freq: Once | INTRAVENOUS | Status: AC | PRN
  Administered 2021-07-23: 500 [IU]
  Filled 2021-07-23: qty 5

## 2021-07-23 MED ORDER — HEPARIN SOD (PORK) LOCK FLUSH 100 UNIT/ML IV SOLN
INTRAVENOUS | Status: AC
  Filled 2021-07-23: qty 5

## 2021-07-23 MED ORDER — SODIUM CHLORIDE 0.9 % IV SOLN
1400.0000 mg | Freq: Once | INTRAVENOUS | Status: AC
  Administered 2021-07-23: 1400 mg via INTRAVENOUS
  Filled 2021-07-23: qty 26.3

## 2021-07-23 MED ORDER — SODIUM CHLORIDE 0.9 % IV SOLN
Freq: Once | INTRAVENOUS | Status: AC
  Filled 2021-07-23: qty 250

## 2021-07-23 NOTE — Telephone Encounter (Signed)
Reached out to Dr. Netty Starring office in regards to pts glucose being in the 300's lately and Dr. Janese Banks will like pts medications adjusted. Lattie Haw the representative stated she has sent a message tot he doctors and they should be reaching out at some point today.

## 2021-07-23 NOTE — Patient Instructions (Signed)
[facility]  Discharge Instructions: Thank you for choosing [facility] to provide your oncology and hematology care.  If you have a lab appointment with the Cancer Center, please go directly to the Cancer Center and check in at the registration area.  Wear comfortable clothing and clothing appropriate for easy access to any Portacath or PICC line.   We strive to give you quality time with your provider. You may need to reschedule your appointment if you arrive late (15 or more minutes).  Arriving late affects you and other patients whose appointments are after yours.  Also, if you miss three or more appointments without notifying the office, you may be dismissed from the clinic at the provider's discretion.      For prescription refill requests, have your pharmacy contact our office and allow 72 hours for refills to be completed.    Today you received the following chemotherapy and/or immunotherapy agents Gemzar   To help prevent nausea and vomiting after your treatment, we encourage you to take your nausea medication as directed.  BELOW ARE SYMPTOMS THAT SHOULD BE REPORTED IMMEDIATELY: *FEVER GREATER THAN 100.4 F (38 C) OR HIGHER *CHILLS OR SWEATING *NAUSEA AND VOMITING THAT IS NOT CONTROLLED WITH YOUR NAUSEA MEDICATION *UNUSUAL SHORTNESS OF BREATH *UNUSUAL BRUISING OR BLEEDING *URINARY PROBLEMS (pain or burning when urinating, or frequent urination) *BOWEL PROBLEMS (unusual diarrhea, constipation, pain near the anus) TENDERNESS IN MOUTH AND THROAT WITH OR WITHOUT PRESENCE OF ULCERS (sore throat, sores in mouth, or a toothache) UNUSUAL RASH, SWELLING OR PAIN  UNUSUAL VAGINAL DISCHARGE OR ITCHING   Items with * indicate a potential emergency and should be followed up as soon as possible or go to the Emergency Department if any problems should occur.  Please show the CHEMOTHERAPY ALERT CARD or IMMUNOTHERAPY ALERT CARD at check-in to the  Emergency Department and triage nurse.  Should you have questions after your visit or need to cancel or reschedule your appointment, please contact [facility] [place] [facility]  [phone] and follow the prompts.  Office hours are 8:00 a.m. to 4:30 p.m. Monday - Friday. Please note that voicemails left after 4:00 p.m. may not be returned until the following business day.  We are closed weekends and major holidays. You have access to a nurse at all times for urgent questions. Please call the main number to the clinic [phone] and follow the prompts.  For any non-urgent questions, you may also contact your provider using MyChart. We now offer e-Visits for anyone 18 and older to request care online for non-urgent symptoms. For details visit [email or web address].   Also download the MyChart app! Go to the app store, search "MyChart", open the app, select [facility], and log in with your MyChart username and password.  Due to Covid, a mask is required upon entering the hospital/clinic. If you do not have a mask, one will be given to you upon arrival. For doctor visits, patients may have 1 support person aged 18 or older with them. For treatment visits, patients cannot have anyone with them due to current Covid guidelines and our immunocompromised population.  

## 2021-07-23 NOTE — Progress Notes (Signed)
Cr 1.77, Alk Phos 371, & ALT 83. Per Dr. Janese Banks, okay to proceed with treatment.

## 2021-07-24 ENCOUNTER — Inpatient Hospital Stay (HOSPITAL_BASED_OUTPATIENT_CLINIC_OR_DEPARTMENT_OTHER): Payer: Medicare PPO | Admitting: Hospice and Palliative Medicine

## 2021-07-24 DIAGNOSIS — Z515 Encounter for palliative care: Secondary | ICD-10-CM

## 2021-07-24 LAB — CANCER ANTIGEN 19-9: CA 19-9: 11325 U/mL — ABNORMAL HIGH (ref 0–35)

## 2021-07-24 NOTE — Progress Notes (Signed)
Did not reach patient for scheduled MyChart visit.  Voicemail left.  Will reschedule. 

## 2021-07-26 ENCOUNTER — Encounter: Payer: Self-pay | Admitting: Oncology

## 2021-07-26 NOTE — Progress Notes (Signed)
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  This was followed by a CT abdomen and pelvis with contrast which showed ill-defined hypervascular lesion in segment 8 of liver measuring 1.6 x 1.4 cm concerning for metastases.  2 other lesions 1.7 x 1 cm and 1.5 x 0.8 cm were also noted.  Large mass in the body of pancreas measuring 5.6 x 2.2 x 2.9 cm.  The lesion causes obstruction of the pancreatic duct resulting in ductal dilatation throughout the tail of the pancreas.  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  Splenic vein appears chronically occluded.  Lesion also comes in contact with splenic artery which remains patent.  This was followed by MRI abdomen which again showed similar findings.  Multiple foci of restricted diffusion with hyperenhancement noted in the right hepatic lobe.  Prominent portocaval lymph node seen for local nodal metastases along with a 5.3 cm body and tail pancreatic mass   Case was discussed at tumor board and lab was liver biopsy 2 weeks after patient initial Covid test that was positive.  However at the day of the biopsy this lesion was in close association with other  vascular structures and given the potential higher risks liver biopsy was aborted and EUS was recommended.  She is currently on Cardizem.     Patient had a EUS at [place] By Dr. Mont Dutton. Pathology was consistent with adenocarcinoma. Patient had a PET scan on 12/11/2020 which showed a hypermetabolic lesion in the medial aspect of the liver with an SUV of 4.2 there is a subtle hypodensity on the comparison CT measuring 9 mm at this level. The hypermetabolic lesion in the dome of the liver corresponds to the enhancing lesion on the comparison MRI. 3.4 x 2.7 cm lesion in the mid body of pancreas with an SUV of 5.1. No hypermetabolic peripancreatic lymph nodes.    Patient received 1 cycle of modified FOLFIRINOX chemotherapy on 12/30/2020.  Following that patient had significant nausea vomiting fatigue and declining performance status requiring chemotherapy to be put on hold.  Patient is currently receiving gemcitabine Abraxane chemotherapy 2 weeks on and 1 week off    Interval history-patient reports pain around her umbilicus from the subcutaneous nodule that she has pain medicine does not help her as much.  Energy levels are stable bowel movements regular  ECOG PS- 1 Pain scale- 3 Opioid associated constipation- no  Review of systems- Review of Systems  Constitutional:  Positive for malaise/fatigue. Negative for chills, fever and weight loss.  HENT:  Negative for congestion, ear discharge and nosebleeds.   Eyes:  Negative for blurred vision.  Respiratory:  Negative for cough, hemoptysis, sputum production, shortness of breath and wheezing.   Cardiovascular:  Negative for chest pain, palpitations, orthopnea and claudication.  Gastrointestinal:  Positive for abdominal pain. Negative for blood in stool, constipation, diarrhea, heartburn, melena, nausea and vomiting.  Genitourinary:  Negative for dysuria, flank pain, frequency, hematuria and urgency.  Musculoskeletal:  Negative for back pain, joint pain  and myalgias.  Skin:  Negative for rash.  Neurological:  Negative for dizziness, tingling, focal weakness, seizures, weakness and headaches.  Endo/Heme/Allergies:  Does not bruise/bleed easily.  Psychiatric/Behavioral:  Negative for depression and suicidal ideas. The patient does not have insomnia.       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   B12 deficiency 09/30/2017   Bladder infection    Cancer associated pain 01/10/2021   Chronic allergic conjunctivitis 12/02/2020   Chronic cystitis 01/26/2017   Chronic pain syndrome 12/16/2017   Controlled type 2 diabetes mellitus with chronic kidney disease, without long-term current use of insulin ([place]) 01/09/2021   Controlled type 2 diabetes mellitus without complication ([place]) 10/25/3565   Convalescence following chemotherapy 01/10/2021   Degeneration of lumbar intervertebral disc 12/16/2017   Diabetes mellitus without complication ([place])    Dysuria 12/23/2016   Essential hypertension 11/15/2015   Family history of kidney cancer    Family history of kidney cancer    Family history of lung cancer    Family history of lung cancer    Family history of stomach cancer    Family history of stomach cancer    Family history of throat cancer    Family history of throat cancer    Female stress incontinence 11/11/2012   Genetic testing 01/28/2021   Negative genetic testing. No pathogenic variants identified on the [facility] Multi-Cancer+RNA panel. VUS in [facility] called c.1978G>A and VUS in [facility] called c.983C>T were identified. The report date its 01/25/2021.  The Multi-Cancer Panel + RNA offered by [facility] includes sequencing and/or deletion duplication testing of the  following 84 genes: AIP, ALK, APC, ATM, AXIN2,BAP1,  BARD1, BLM, BMPR1A, BRC   Goals of care, counseling/discussion 12/13/2020   History of atrial fibrillation 04/09/2016   History of chronic health problem 05/11/2016   Hyperlipidemia, mixed 12/09/2020   Hypertension    Incomplete bladder emptying 11/16/2012   Low back pain 11/11/2012   Low bladder compliance 11/11/2012   Lumbar post-laminectomy syndrome 12/16/2017   Medicare annual wellness visit, subsequent 09/29/2016   Menopause syndrome 12/24/2016   Other specified symptom associated with female genital organs 11/11/2012   Pancreatic cancer [facility])    with liver mass   Pancreatic cancer metastasized to liver ([place]) 12/13/2020   Pancreatic cancer metastasized to liver ([place]) 12/13/2020   Paroxysmal A-fib ([place]) 12/09/2020   Stage 3b chronic kidney disease ([place]) 03/23/2020   Symptoms involving urinary system 11/11/2012   Type 2 diabetes mellitus ([place]) 11/15/2015   Type 2 diabetes mellitus with diabetic chronic kidney disease ([place]) 03/23/2020   Vaginal spotting 05/11/2017   Vasomotor rhinitis 12/02/2020     Past Surgical History:  Procedure Laterality Date   BACK SURGERY     PORTA CATH INSERTION N/A 12/27/2020   Procedure: PORTA CATH INSERTION;  Surgeon: Algernon Huxley, MD;  Location: [facility];  Service: Cardiovascular;  Laterality: N/A;  TUBAL LIGATION      Social History   Socioeconomic History   Marital status: Married    Spouse name: Not on file   Number of children: Not on file   Years of education: Not on file   Highest education level: Not on file  Occupational History   Not on file  Tobacco Use   Smoking status: Former    Types: Cigarettes    Quit date: 11/20/1979    Years since quitting: 41.7   Smokeless tobacco: Never  Vaping Use   Vaping Use: Never used  Substance and Sexual Activity   Alcohol use: No    Alcohol/week: 0.0 standard drinks   Drug use: No   Sexual activity: Never  Other Topics Concern   Not on file   Social History Narrative   Not on file   Social Determinants of Health   Financial Resource Strain: Not on file  Food Insecurity: Not on file  Transportation Needs: Not on file  Physical Activity: Not on file  Stress: Not on file  Social Connections: Not on file  Intimate Partner Violence: Not on file    Family History  Problem Relation Age of Onset   Kidney cancer Father 87   COPD Mother    Stomach cancer Maternal Aunt    Throat cancer Maternal Uncle    Liver cancer Paternal Uncle    Esophageal cancer Paternal Grandfather    Lung cancer Maternal Aunt    Lung cancer Maternal Uncle    Lung cancer Cousin    Kidney cancer Cousin 53   Testicular cancer Nephew        dx 20s   Breast cancer Neg Hx      Current Outpatient Medications:    azelastine (ASTELIN) 0.1 % nasal spray, Place into both nostrils daily as needed., Disp: , Rfl:    azelastine (OPTIVAR) 0.05 % ophthalmic solution, Place into both eyes daily as needed., Disp: , Rfl: 3   diltiazem (TIAZAC) 180 MG 24 hr capsule, Take by mouth daily., Disp: , Rfl:    ferrous sulfate 325 (65 FE) MG EC tablet, Take 325 mg by mouth daily with breakfast., Disp: , Rfl:    gabapentin (NEURONTIN) 100 MG capsule, Take 1 capsule (100 mg total) by mouth at bedtime., Disp: 30 capsule, Rfl: 2   HYDROcodone-acetaminophen (NORCO) 5-325 MG tablet, Take 1 tablet by mouth every 6 (six) hours as needed for moderate pain., Disp: 60 tablet, Rfl: 0   LORazepam (ATIVAN) 0.5 MG tablet, TAKE 1 TABLET(0.5 MG) BY MOUTH EVERY 8 HOURS, Disp: 60 tablet, Rfl: 0   losartan (COZAAR) 100 MG tablet, Take 100 mg by mouth daily., Disp: , Rfl:    morphine (MS CONTIN) 15 MG 12 hr tablet, Take 1 tablet (15 mg total) by mouth every 12 (twelve) hours., Disp: 60 tablet, Rfl: 0   pantoprazole (PROTONIX) 20 MG tablet, Take 1 tablet (20 mg total) by mouth daily., Disp: 30 tablet, Rfl: 2   traZODone (DESYREL) 50 MG tablet, Take 0.5-1 tablets (25-50 mg total) by mouth at  bedtime as needed for sleep., Disp: 30 tablet, Rfl: 3   glimepiride (AMARYL) 2 MG tablet, Take 2 mg by mouth daily with breakfast. (Patient not taking: No sig reported), Disp: , Rfl:    losartan (COZAAR) 25 MG tablet, Take 1 tablet by mouth daily. (Patient not taking: No sig reported), Disp: , Rfl:    ONE TOUCH ULTRA TEST test strip, , Disp: , Rfl: 1  ONETOUCH DELICA LANCETS 48O MISC, 1 each by XX route as directed. Check CBG's fasting once daily. Dx: E11.9 (Patient not taking: Reported on 07/23/2021), Disp: , Rfl:    sertraline (ZOLOFT) 50 MG tablet, sertraline 50 mg tablet (Patient not taking: No sig reported), Disp: , Rfl:  No current facility-administered medications for this visit.  Facility-Administered Medications Ordered in Other Visits:    heparin lock flush 100 UNIT/ML injection, , , ,    sodium chloride flush (NS) 0.9 % injection 10 mL, 10 mL, Intravenous, PRN, Sindy Guadeloupe, MD, 10 mL at 03/04/21 1045  Physical exam:  Vitals:   07/23/21 0837  BP: 129/62  Pulse: 81  Resp: 16  Temp: 97.7 F (36.5 C)  TempSrc: Tympanic  Weight: 150 lb 8 oz (68.3 kg)  Height: _0  (1.753 m)   Physical Exam Constitutional:      General: She is not in acute distress. Cardiovascular:     Rate and Rhythm: Normal rate and regular rhythm.     Heart sounds: Normal heart sounds.  Pulmonary:     Effort: Pulmonary effort is normal.     Breath sounds: Normal breath sounds.  Abdominal:     General: Bowel sounds are normal.     Palpations: Abdomen is soft.     Comments: Palpable subcutaneous nodule at the 12 o'clock position of the umbilicus concerning for pancreatic cancer metastases  Skin:    General: Skin is warm and dry.  Neurological:     Mental Status: She is alert and oriented to person, place, and time.     CMP Latest Ref Rng & Units 07/23/2021  Glucose 70 - 99 mg/dL 314(H)  BUN 8 - 23 mg/dL 31(H)  Creatinine 0.44 - 1.00 mg/dL 1.77(H)  Sodium 135 - 145 mmol/L 133(L)  Potassium 3.5  - 5.1 mmol/L 4.0  Chloride 98 - 111 mmol/L 97(L)  CO2 22 - 32 mmol/L 26  Calcium 8.9 - 10.3 mg/dL 9.9  Total Protein 6.5 - 8.1 g/dL 7.8  Total Bilirubin 0.3 - 1.2 mg/dL 0.6  Alkaline Phos 38 - 126 U/L 371(H)  AST 15 - 41 U/L 41  ALT 0 - 44 U/L 83(H)   CBC Latest Ref Rng & Units 07/23/2021  WBC 4.0 - 10.5 K/uL 4.8  Hemoglobin 12.0 - 15.0 g/dL 9.6(L)  Hematocrit 36.0 - 46.0 % 30.3(L)  Platelets 150 - 400 K/uL 226    No images are attached to the encounter.  CT CHEST ABDOMEN PELVIS WO CONTRAST  Result Date: 07/09/2021 CLINICAL DATA:  Follow-up pancreatic cancer. Diagnosis January 2022 radiation therapy and chemotherapy complete EXAM: CT CHEST, ABDOMEN AND PELVIS WITHOUT CONTRAST TECHNIQUE: Multidetector CT imaging of the chest, abdomen and pelvis was performed following the standard protocol without IV contrast. COMPARISON:  PET-CT 04/01/2021, MRI 618 1,022 FINDINGS: CT CHEST FINDINGS Cardiovascular: Port in the anterior chest wall with tip in distal SVC. Mediastinum/Nodes: No axillary or supraclavicular adenopathy. No mediastinal or hilar adenopathy. No pericardial fluid. Esophagus normal. Stable enlargement of the RIGHT lobe of thyroid gland which extends posterior to the trachea in the thoracic inlet Lungs/Pleura: Mild scarring at the lung bases. No suspicious pulmonary nodules. Musculoskeletal: No aggressive osseous lesion. CT ABDOMEN AND PELVIS FINDINGS Hepatobiliary: 2 adjacent low-density lesions in the posterior RIGHT hepatic lobe. 14 mm lesion on image 59/series 2 corresponds to cyst on comparison MRI. Slightly more inferior 2.2 by 1.5 cm (image 61/series 2 lesion is not placed on comparison MRI or PET scan. Third, low-density  lesion the dome liver measuring 1.1 cm (image 43/series 3) is present on noncontrast PET-CT scan 04/02/2019. Lesion was not hypermetabolic. Pancreas: Fullness in the mid body the pancreas similar prior measuring 3.6 x 2.8 (image 65/2) which compares to 4.9 x 2.4 cm on  PET-CT scan. Difficult to evaluate lesion without IV contrast. No periportal peripancreatic adenopathy Spleen: Normal spleen Adrenals/urinary tract: Adrenal glands and kidneys are normal. The ureters and bladder normal. Stomach/Bowel: Stomach, small bowel, appendix, and cecum are normal. The colon and rectosigmoid colon are normal. Vascular/Lymphatic: Abdominal aorta is normal caliber. There is no retroperitoneal or periportal lymphadenopathy. No pelvic lymphadenopathy. Reproductive: Uterus and adnexa unremarkable. Other: Peritoneal or omental metastasis identified Musculoskeletal: No aggressive osseous lesion. IMPRESSION: Chest Impression: 1. No evidence thoracic metastasis Abdomen / Pelvis Impression: 1. Mass in the mid body pancreas is grossly unchanged on noncontrast CT imaging. Consider MRI evaluation (preferably with contrast). 2. One lesion in the RIGHT hepatic lobe is not clearly placed on comparison exams. Consider abdominal MRI of the liver for further evaluation (preferably with contrast). 3. No evidence of peritoneal metastasis Electronically Signed   By: Suzy Bouchard M.D.   On: 07/09/2021 11:45     Assessment and plan- Patient is a 80 y.o. female with history of metastatic pancreatic cancer with liver metastases here for on treatment assessment prior to cycle 7-day 8 of gemcitabine chemotherapy  Patient received gemcitabine Abraxane chemotherapy on 3 05/20/2021 and was discontinued for 3 weeks due to worsening anemia and CKD.  Her creatinine is now back to baseline at 1.7 and hemoglobin is improved from 7.7-9.5.  Her baseline hemoglobin prior to starting chemotherapy was around 12 but since chemotherapy it has been around 9.  She did have recent CT scan on 07/08/2021 which showed no change in the size of the mass in the mid body of the pancreas.  Liver lesion was not clearly visualized.  No evidence of peritoneal metastatic disease.However her CA 19-9 has exponentially increased from 642 in  August 20 22-11,325 presently.  She also has a new palpable nodule in her superior aspect of the umbilicus which is concerning for metastatic disease.  Patient tolerated modified FOLFIRINOX chemotherapy poorly in the past.  I have restarted single agent gemcitabine chemotherapy at this time.  It has been difficult to do gemcitabine Abraxane combination due to worsening anemia as well as CKD.  There is a consistent increase in her CA 19-9 despite starting gemcitabine I will consider getting a short-term CT scan and switch her to FOLFIRI if she can tolerate it  Counts otherwise okay to proceed with cycle 7-day 8 of gemcitabine chemotherapy today.  No chemotherapy next week and I will see her back in 2 weeks for cycle 8-day 1 of gemcitabine chemotherapy   Visit Diagnosis 1. Pancreatic cancer metastasized to liver ([place])   2. Encounter for antineoplastic chemotherapy   3. Anemia due to antineoplastic chemotherapy   4. Stage 3b chronic kidney disease ([place])      Dr. Randa Evens, MD, MPH [facility] [phone] 07/26/2021 12:57 PM

## 2021-07-28 ENCOUNTER — Other Ambulatory Visit: Payer: Self-pay

## 2021-07-28 ENCOUNTER — Encounter: Payer: Self-pay | Admitting: Radiation Oncology

## 2021-07-28 ENCOUNTER — Ambulatory Visit
Admission: RE | Admit: 2021-07-28 | Discharge: 2021-07-28 | Disposition: A | Discharge status: Home / Self Care | Payer: Medicare PPO | Source: Ambulatory Visit | Attending: Radiation Oncology | Admitting: Radiation Oncology

## 2021-07-28 DIAGNOSIS — N189 Chronic kidney disease, unspecified: Secondary | ICD-10-CM | POA: Insufficient documentation

## 2021-07-28 DIAGNOSIS — G893 Neoplasm related pain (acute) (chronic): Secondary | ICD-10-CM | POA: Diagnosis not present

## 2021-07-28 DIAGNOSIS — M5136 Other intervertebral disc degeneration, lumbar region: Secondary | ICD-10-CM | POA: Diagnosis not present

## 2021-07-28 DIAGNOSIS — C259 Malignant neoplasm of pancreas, unspecified: Secondary | ICD-10-CM

## 2021-07-28 DIAGNOSIS — I4891 Unspecified atrial fibrillation: Secondary | ICD-10-CM | POA: Diagnosis not present

## 2021-07-28 DIAGNOSIS — E785 Hyperlipidemia, unspecified: Secondary | ICD-10-CM | POA: Insufficient documentation

## 2021-07-28 DIAGNOSIS — I129 Hypertensive chronic kidney disease with stage 1 through stage 4 chronic kidney disease, or unspecified chronic kidney disease: Secondary | ICD-10-CM | POA: Diagnosis not present

## 2021-07-28 DIAGNOSIS — Z87891 Personal history of nicotine dependence: Secondary | ICD-10-CM | POA: Insufficient documentation

## 2021-07-28 DIAGNOSIS — D631 Anemia in chronic kidney disease: Secondary | ICD-10-CM | POA: Insufficient documentation

## 2021-07-28 DIAGNOSIS — C792 Secondary malignant neoplasm of skin: Secondary | ICD-10-CM | POA: Diagnosis not present

## 2021-07-28 DIAGNOSIS — E538 Deficiency of other specified B group vitamins: Secondary | ICD-10-CM | POA: Diagnosis not present

## 2021-07-28 DIAGNOSIS — Z800 Family history of malignant neoplasm of digestive organs: Secondary | ICD-10-CM | POA: Insufficient documentation

## 2021-07-28 DIAGNOSIS — Z801 Family history of malignant neoplasm of trachea, bronchus and lung: Secondary | ICD-10-CM | POA: Diagnosis not present

## 2021-07-28 DIAGNOSIS — E1122 Type 2 diabetes mellitus with diabetic chronic kidney disease: Secondary | ICD-10-CM | POA: Insufficient documentation

## 2021-07-28 DIAGNOSIS — C787 Secondary malignant neoplasm of liver and intrahepatic bile duct: Secondary | ICD-10-CM | POA: Diagnosis not present

## 2021-07-28 DIAGNOSIS — Z79899 Other long term (current) drug therapy: Secondary | ICD-10-CM | POA: Diagnosis not present

## 2021-07-28 DIAGNOSIS — Z8051 Family history of malignant neoplasm of kidney: Secondary | ICD-10-CM | POA: Insufficient documentation

## 2021-07-28 NOTE — Consult Note (Signed)
NEW PATIENT EVALUATION  Name: Rhonda Day  MRN: [id number]  Date:   07/28/2021     DOB: 01/01/1941   This 80 y.o. 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  REFERRING PHYSICIAN: Dion Body, MD  CHIEF COMPLAINT:  Chief Complaint  Patient presents with   Pancreatic Cancer    Initial consultation    DIAGNOSIS: The encounter diagnosis was Pancreatic cancer metastasized to liver [facility]).   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  She was diagnosed at [facility] with EUS with pathology consistent with adenocarcinoma.  She also has liver lesions.  She is currently being receiving gemcitabine as well as Abraxane chemotherapy 2 weeks on and 1 week off.  She is now developed a painful lesion around her umbilicus.  On her previous PET scan you can see some hypermetabolic activity in that area.  She also has recent CT scan showing progression of this disease.  She is on narcotic analgesics now for the umbilical pain.  I been asked to evaluate her for possible palliative radiation therapy to this lesion.  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), B12 deficiency (09/30/2017), Bladder infection, Cancer associated pain (01/10/2021), Chronic allergic conjunctivitis (12/02/2020), Chronic cystitis (01/26/2017), Chronic pain syndrome (12/16/2017), Controlled type 2 diabetes mellitus with chronic kidney disease, without long-term current use of  insulin ([place]) (01/09/2021), Controlled type 2 diabetes mellitus without complication ([place]) (01/20/101), Convalescence following chemotherapy (01/10/2021), Degeneration of lumbar intervertebral disc (12/16/2017), Diabetes mellitus without complication ([place]), Dysuria (12/23/2016), Essential hypertension (11/15/2015), Family history of kidney cancer, Family history of kidney cancer, Family history of lung cancer, Family history of lung cancer, Family history of stomach cancer, Family history of stomach cancer, Family history of throat cancer, Family history of throat cancer, Female stress incontinence (11/11/2012), Genetic testing (01/28/2021), Goals of care, counseling/discussion (12/13/2020), History of atrial fibrillation (04/09/2016), History of chronic health problem (05/11/2016), Hyperlipidemia, mixed (12/09/2020), Hypertension, Incomplete bladder emptying (11/16/2012), Low back pain (11/11/2012), Low bladder compliance (11/11/2012), Lumbar post-laminectomy syndrome (12/16/2017), Medicare annual wellness visit, subsequent (09/29/2016), Menopause syndrome (12/24/2016), Other specified symptom associated with female genital organs (11/11/2012), Pancreatic cancer ([place]), Pancreatic cancer metastasized to liver ([place]) (12/13/2020), Pancreatic cancer metastasized to liver ([place]) (12/13/2020), Paroxysmal A-fib ([place]) (12/09/2020), Stage 3b chronic kidney disease ([place]) (03/23/2020), Symptoms involving urinary system (11/11/2012), Type 2 diabetes mellitus ([place]) (11/15/2015), Type 2 diabetes mellitus with diabetic chronic kidney disease ([place]) (03/23/2020), Vaginal spotting (05/11/2017), and 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    PAST SURGICAL HISTORY:  Past Surgical History:  Procedure Laterality Date   BACK SURGERY     PORTA CATH INSERTION N/A 12/27/2020   Procedure: PORTA CATH INSERTION;  Surgeon: Algernon Huxley, MD;  Location: [facility];  Service: Cardiovascular;  Laterality: N/A;   TUBAL LIGATION      FAMILY HISTORY: family history  includes COPD in her mother; Esophageal cancer in her paternal grandfather; Kidney cancer (age of onset: 62) in her cousin; Kidney cancer (age of onset: 69) in her father; Liver cancer in her paternal uncle; Lung cancer in her cousin, maternal  aunt, and maternal uncle; Stomach cancer in her maternal aunt; Testicular cancer in her nephew; Throat cancer in her maternal uncle.  SOCIAL HISTORY:  reports that she quit smoking about 41 years ago. She has never used smokeless tobacco. She reports that she does not drink alcohol and does not use drugs.  ALLERGIES: Sulfa antibiotics  MEDICATIONS:  Current Outpatient Medications  Medication Sig Dispense Refill   azelastine (ASTELIN) 0.1 % nasal spray Place into both nostrils daily as needed.     azelastine (OPTIVAR) 0.05 % ophthalmic solution Place into both eyes daily as needed.  3   diltiazem (TIAZAC) 180 MG 24 hr capsule Take by mouth daily.     ferrous sulfate 325 (65 FE) MG EC tablet Take 325 mg by mouth daily with breakfast.     gabapentin (NEURONTIN) 100 MG capsule Take 1 capsule (100 mg total) by mouth at bedtime. 30 capsule 2   HYDROcodone-acetaminophen (NORCO) 5-325 MG tablet Take 1 tablet by mouth every 6 (six) hours as needed for moderate pain. 60 tablet 0   Insulin Glargine Solostar (LANTUS) 100 UNIT/ML Solostar Pen Inject 10 Units into the skin at bedtime.     LORazepam (ATIVAN) 0.5 MG tablet TAKE 1 TABLET(0.5 MG) BY MOUTH EVERY 8 HOURS 60 tablet 0   losartan (COZAAR) 100 MG tablet Take 100 mg by mouth daily.     morphine (MS CONTIN) 15 MG 12 hr tablet Take 1 tablet (15 mg total) by mouth every 12 (twelve) hours. 60 tablet 0   pantoprazole (PROTONIX) 20 MG tablet Take 1 tablet (20 mg total) by mouth daily. 30 tablet 2   traZODone (DESYREL) 50 MG tablet Take 0.5-1 tablets (25-50 mg total) by mouth at bedtime as needed for sleep. 30 tablet 3   glimepiride (AMARYL) 2 MG tablet Take 2 mg by mouth daily with breakfast. (Patient not taking: No sig  reported)     losartan (COZAAR) 25 MG tablet Take 1 tablet by mouth daily. (Patient not taking: No sig reported)     ONE TOUCH ULTRA TEST test strip  (Patient not taking: No sig reported)  1   ONETOUCH DELICA LANCETS 19F MISC 1 each by XX route as directed. Check CBG's fasting once daily. Dx: E11.9 (Patient not taking: No sig reported)     sertraline (ZOLOFT) 50 MG tablet sertraline 50 mg tablet (Patient not taking: No sig reported)     No current facility-administered medications for this encounter.   Facility-Administered Medications Ordered in Other Encounters  Medication Dose Route Frequency Provider Last Rate Last Admin   heparin lock flush 100 UNIT/ML injection            sodium chloride flush (NS) 0.9 % injection 10 mL  10 mL Intravenous PRN Sindy Guadeloupe, MD   10 mL at 03/04/21 1045    ECOG PERFORMANCE STATUS:  1 - Symptomatic but completely ambulatory  REVIEW OF SYSTEMS: Patient denies any weight loss, fatigue, weakness, fever, chills or night sweats. Patient denies any loss of vision, blurred vision. Patient denies any ringing  of the ears or hearing loss. No irregular heartbeat. Patient denies heart murmur or history of fainting. Patient denies any chest pain or pain radiating to her upper extremities. Patient denies any shortness of breath, difficulty breathing at night, cough or hemoptysis. Patient denies any swelling in the lower legs. Patient denies any nausea vomiting, vomiting of blood, or coffee ground material in the vomitus. Patient denies any stomach pain. Patient states has had  normal bowel movements no significant constipation or diarrhea. Patient denies any dysuria, hematuria or significant nocturia. Patient denies any problems walking, swelling in the joints or loss of balance. Patient denies any skin changes, loss of hair or loss of weight. Patient denies any excessive worrying or anxiety or significant depression. Patient denies any problems with insomnia. Patient denies  excessive thirst, polyuria, polydipsia. Patient denies any swollen glands, patient denies easy bruising or easy bleeding. Patient denies any recent infections, allergies or URI. Patient "s visual fields have not changed significantly in recent time.   PHYSICAL EXAM: BP (!) (P) 130/59 (BP Location: Left Arm, Patient Position: Sitting)   Temp (!) (P) 96.2 F (35.7 C) (Tympanic)   Resp (P) 16   Wt (P) 149 lb 6.4 oz (67.8 kg)   LMP  (LMP Unknown)   BMI (P) 22.06 kg/m  Patient is a firm nodule subcutaneous in the umbilical region.  It measures approximately 3 cm.  Well-developed well-nourished patient in NAD. HEENT reveals PERLA, EOMI, discs not visualized.  Oral cavity is clear. No oral mucosal lesions are identified. Neck is clear without evidence of cervical or supraclavicular adenopathy. Lungs are clear to A&P. Cardiac examination is essentially unremarkable with regular rate and rhythm without murmur rub or thrill. Abdomen is benign with no organomegaly or masses noted. Motor sensory and DTR levels are equal and symmetric in the upper and lower extremities. Cranial nerves II through XII are grossly intact. Proprioception is intact. No peripheral adenopathy or edema is identified. No motor or sensory levels are noted. Crude visual fields are within normal range.  LABORATORY DATA: Pathology report reviewed    RADIOLOGY RESULTS: CT scans and PET CT scans reviewed compatible with above-stated findings   IMPRESSION: Stage IV metastatic adenocarcinoma the pancreas with painful metastatic lesion to her umbilicus in 80 year old female  PLAN: At this time elect to go ahead with palliative course of radiation therapy to her umbilical plical area.  We will plan on delivering 30 Gray in 10 fractions using electron beam to spare her underlying GI tract.  Risks and benefits of treatment eluding skin reaction possible fatigue all were discussed in detail with the patient.  I have set set her up for simulation  tomorrow.  Patient comprehends my recommendations well.  I would like to take this opportunity to thank you for allowing me to participate in the care of your patient.Noreene Filbert, MD

## 2021-07-29 ENCOUNTER — Ambulatory Visit
Admission: RE | Admit: 2021-07-29 | Discharge: 2021-07-29 | Disposition: A | Discharge status: Home / Self Care | Payer: Medicare PPO | Source: Ambulatory Visit | Attending: Radiation Oncology | Admitting: Radiation Oncology

## 2021-07-29 DIAGNOSIS — Z510 Encounter for antineoplastic radiation therapy: Secondary | ICD-10-CM | POA: Diagnosis not present

## 2021-07-30 ENCOUNTER — Other Ambulatory Visit: Payer: Self-pay

## 2021-07-30 ENCOUNTER — Inpatient Hospital Stay: Payer: Medicare PPO

## 2021-07-30 VITALS — BP 118/68 | HR 71 | Temp 98.0°F

## 2021-07-30 DIAGNOSIS — C259 Malignant neoplasm of pancreas, unspecified: Secondary | ICD-10-CM

## 2021-07-30 DIAGNOSIS — C787 Secondary malignant neoplasm of liver and intrahepatic bile duct: Secondary | ICD-10-CM

## 2021-07-30 DIAGNOSIS — Z510 Encounter for antineoplastic radiation therapy: Secondary | ICD-10-CM | POA: Diagnosis not present

## 2021-07-30 LAB — COMPREHENSIVE METABOLIC PANEL
ALT: 89 U/L — ABNORMAL HIGH (ref 0–44)
AST: 53 U/L — ABNORMAL HIGH (ref 15–41)
Albumin: 4 g/dL (ref 3.5–5.0)
Alkaline Phosphatase: 427 U/L — ABNORMAL HIGH (ref 38–126)
Anion gap: 10 (ref 5–15)
BUN: 32 mg/dL — ABNORMAL HIGH (ref 8–23)
CO2: 26 mmol/L (ref 22–32)
Calcium: 9.5 mg/dL (ref 8.9–10.3)
Chloride: 95 mmol/L — ABNORMAL LOW (ref 98–111)
Creatinine, Ser: 1.98 mg/dL — ABNORMAL HIGH (ref 0.44–1.00)
GFR, Estimated: 26 mL/min — ABNORMAL LOW (ref >60)
Glucose, Bld: 400 mg/dL — ABNORMAL HIGH (ref 70–99)
Potassium: 4.3 mmol/L (ref 3.5–5.1)
Sodium: 131 mmol/L — ABNORMAL LOW (ref 135–145)
Total Bilirubin: 0.5 mg/dL (ref 0.3–1.2)
Total Protein: 7.7 g/dL (ref 6.5–8.1)

## 2021-07-30 LAB — CBC WITH DIFFERENTIAL/PLATELET
Abs Immature Granulocytes: 0.03 10*3/uL (ref 0.00–0.07)
Basophils Absolute: 0 10*3/uL (ref 0.0–0.1)
Basophils Relative: 0 %
Eosinophils Absolute: 0 10*3/uL (ref 0.0–0.5)
Eosinophils Relative: 0 %
HCT: 30 % — ABNORMAL LOW (ref 36.0–46.0)
Hemoglobin: 9.3 g/dL — ABNORMAL LOW (ref 12.0–15.0)
Immature Granulocytes: 1 %
Lymphocytes Relative: 24 %
Lymphs Abs: 1.2 10*3/uL (ref 0.7–4.0)
MCH: 29.9 pg (ref 26.0–34.0)
MCHC: 31 g/dL (ref 30.0–36.0)
MCV: 96.5 fL (ref 80.0–100.0)
Monocytes Absolute: 0.4 10*3/uL (ref 0.1–1.0)
Monocytes Relative: 9 %
Neutro Abs: 3.1 10*3/uL (ref 1.7–7.7)
Neutrophils Relative %: 66 %
Platelets: 162 10*3/uL (ref 150–400)
RBC: 3.11 MIL/uL — ABNORMAL LOW (ref 3.87–5.11)
RDW: 14.7 % (ref 11.5–15.5)
WBC: 4.8 10*3/uL (ref 4.0–10.5)
nRBC: 0 % (ref 0.0–0.2)

## 2021-07-30 MED ORDER — HEPARIN SOD (PORK) LOCK FLUSH 100 UNIT/ML IV SOLN
500.0000 [IU] | Freq: Once | INTRAVENOUS | Status: AC
  Administered 2021-07-30: 500 [IU] via INTRAVENOUS
  Filled 2021-07-30: qty 5

## 2021-07-30 MED ORDER — SODIUM CHLORIDE 0.9 % IV SOLN
INTRAVENOUS | Status: DC
  Filled 2021-07-30 (×2): qty 250

## 2021-07-30 NOTE — Progress Notes (Signed)
Per Dr. Janese Banks, no chemo treatment today since patient blood glucose and ALT levels are elevated. 1 liter NS bolus was given and patient is rescheduled for treatment next week. Patient aware of next appointment.

## 2021-07-30 NOTE — Patient Instructions (Signed)
[facility]  Discharge Instructions: Thank you for choosing [facility] to provide your oncology and hematology care.  If you have a lab appointment with the [facility], please go directly to the [facility] and check in at the registration area.  Wear comfortable clothing and clothing appropriate for easy access to any Portacath or PICC line.   We strive to give you quality time with your provider. You may need to reschedule your appointment if you arrive late (15 or more minutes).  Arriving late affects you and other patients whose appointments are after yours.  Also, if you miss three or more appointments without notifying the office, you may be dismissed from the clinic at the provider's discretion.      For prescription refill requests, have your pharmacy contact our office and allow 72 hours for refills to be completed.    Today you received the following chemotherapy and/or immunotherapy agents GEMZAR      To help prevent nausea and vomiting after your treatment, we encourage you to take your nausea medication as directed.  BELOW ARE SYMPTOMS THAT SHOULD BE REPORTED IMMEDIATELY: *FEVER GREATER THAN 100.4 F (38 C) OR HIGHER *CHILLS OR SWEATING *NAUSEA AND VOMITING THAT IS NOT CONTROLLED WITH YOUR NAUSEA MEDICATION *UNUSUAL SHORTNESS OF BREATH *UNUSUAL BRUISING OR BLEEDING *URINARY PROBLEMS (pain or burning when urinating, or frequent urination) *BOWEL PROBLEMS (unusual diarrhea, constipation, pain near the anus) TENDERNESS IN MOUTH AND THROAT WITH OR WITHOUT PRESENCE OF ULCERS (sore throat, sores in mouth, or a toothache) UNUSUAL RASH, SWELLING OR PAIN  UNUSUAL VAGINAL DISCHARGE OR ITCHING   Items with * indicate a potential emergency and should be followed up as soon as possible or go to the Emergency Department if any problems should occur.  Please show the CHEMOTHERAPY ALERT CARD or IMMUNOTHERAPY ALERT CARD at check-in to  the Emergency Department and triage nurse.  Should you have questions after your visit or need to cancel or reschedule your appointment, please contact [facility]  [phone] and follow the prompts.  Office hours are 8:00 a.m. to 4:30 p.m. Monday - Friday. Please note that voicemails left after 4:00 p.m. may not be returned until the following business day.  We are closed weekends and major holidays. You have access to a nurse at all times for urgent questions. Please call the main number to the clinic [phone] and follow the prompts.  For any non-urgent questions, you may also contact your provider using MyChart. We now offer e-Visits for anyone 67 and older to request care online for non-urgent symptoms. For details visit [email or web address].   Also download the MyChart app! Go to the app store, search "MyChart", open the app, select [facility], and log in with your MyChart username and password.  Due to Covid, a mask is required upon entering the hospital/clinic. If you do not have a mask, one will be given to you upon arrival. For doctor visits, patients may have 1 support person aged 22 or older with them. For treatment visits, patients cannot have anyone with them due to current Covid guidelines and our immunocompromised population.   Gemcitabine injection What is this medication? GEMCITABINE (jem SYE ta been) is a chemotherapy drug. This medicine is used to treat many types of cancer like breast cancer, lung cancer, pancreatic cancer, and ovarian cancer. This medicine may be used for other purposes; ask your health care provider or pharmacist if you have questions. COMMON  BRAND NAME(S): Gemzar, Infugem What should I tell my care team before I take this medication? They need to know if you have any of these conditions: blood disorders infection kidney disease liver disease lung or breathing disease, like asthma recent or ongoing radiation  therapy an unusual or allergic reaction to gemcitabine, other chemotherapy, other medicines, foods, dyes, or preservatives pregnant or trying to get pregnant breast-feeding How should I use this medication? This drug is given as an infusion into a vein. It is administered in a hospital or clinic by a specially trained health care professional. Talk to your pediatrician regarding the use of this medicine in children. Special care may be needed. Overdosage: If you think you have taken too much of this medicine contact a poison control center or emergency room at once. NOTE: This medicine is only for you. Do not share this medicine with others. What if I miss a dose? It is important not to miss your dose. Call your doctor or health care professional if you are unable to keep an appointment. What may interact with this medication? medicines to increase blood counts like filgrastim, pegfilgrastim, sargramostim some other chemotherapy drugs like cisplatin vaccines Talk to your doctor or health care professional before taking any of these medicines: acetaminophen aspirin ibuprofen ketoprofen naproxen This list may not describe all possible interactions. Give your health care provider a list of all the medicines, herbs, non-prescription drugs, or dietary supplements you use. Also tell them if you smoke, drink alcohol, or use illegal drugs. Some items may interact with your medicine. What should I watch for while using this medication? Visit your doctor for checks on your progress. This drug may make you feel generally unwell. This is not uncommon, as chemotherapy can affect healthy cells as well as cancer cells. Report any side effects. Continue your course of treatment even though you feel ill unless your doctor tells you to stop. In some cases, you may be given additional medicines to help with side effects. Follow all directions for their use. Call your doctor or health care professional for  advice if you get a fever, chills or sore throat, or other symptoms of a cold or flu. Do not treat yourself. This drug decreases your body's ability to fight infections. Try to avoid being around people who are sick. This medicine may increase your risk to bruise or bleed. Call your doctor or health care professional if you notice any unusual bleeding. Be careful brushing and flossing your teeth or using a toothpick because you may get an infection or bleed more easily. If you have any dental work done, tell your dentist you are receiving this medicine. Avoid taking products that contain aspirin, acetaminophen, ibuprofen, naproxen, or ketoprofen unless instructed by your doctor. These medicines may hide a fever. Do not become pregnant while taking this medicine or for 6 months after stopping it. Women should inform their doctor if they wish to become pregnant or think they might be pregnant. Men should not father a child while taking this medicine and for 3 months after stopping it. There is a potential for serious side effects to an unborn child. Talk to your health care professional or pharmacist for more information. Do not breast-feed an infant while taking this medicine or for at least 1 week after stopping it. Men should inform their doctors if they wish to father a child. This medicine may lower sperm counts. Talk with your doctor or health care professional if you are concerned about  your fertility. What side effects may I notice from receiving this medication? Side effects that you should report to your doctor or health care professional as soon as possible: allergic reactions like skin rash, itching or hives, swelling of the face, lips, or tongue breathing problems pain, redness, or irritation at site where injected signs and symptoms of a dangerous change in heartbeat or heart rhythm like chest pain; dizziness; fast or irregular heartbeat; palpitations; feeling faint or lightheaded, falls;  breathing problems signs of decreased platelets or bleeding - bruising, pinpoint red spots on the skin, black, tarry stools, blood in the urine signs of decreased red blood cells - unusually weak or tired, feeling faint or lightheaded, falls signs of infection - fever or chills, cough, sore throat, pain or difficulty passing urine signs and symptoms of kidney injury like trouble passing urine or change in the amount of urine signs and symptoms of liver injury like dark yellow or brown urine; general ill feeling or flu-like symptoms; light-colored stools; loss of appetite; nausea; right upper belly pain; unusually weak or tired; yellowing of the eyes or skin swelling of ankles, feet, hands Side effects that usually do not require medical attention (report to your doctor or health care professional if they continue or are bothersome): constipation diarrhea hair loss loss of appetite nausea rash vomiting This list may not describe all possible side effects. Call your doctor for medical advice about side effects. You may report side effects to FDA at 1-800-FDA-1088. Where should I keep my medication? This drug is given in a hospital or clinic and will not be stored at home. NOTE: This sheet is a summary. It may not cover all possible information. If you have questions about this medicine, talk to your doctor, pharmacist, or health care provider.  2022 Elsevier/Gold Standard (2017-12-29 18:06:11)

## 2021-08-04 ENCOUNTER — Ambulatory Visit
Admission: RE | Admit: 2021-08-04 | Discharge: 2021-08-04 | Disposition: A | Discharge status: Home / Self Care | Payer: Medicare PPO | Source: Ambulatory Visit | Attending: Radiation Oncology | Admitting: Radiation Oncology

## 2021-08-04 DIAGNOSIS — Z510 Encounter for antineoplastic radiation therapy: Secondary | ICD-10-CM | POA: Diagnosis not present

## 2021-08-05 ENCOUNTER — Ambulatory Visit
Admission: RE | Admit: 2021-08-05 | Discharge: 2021-08-05 | Disposition: A | Discharge status: Home / Self Care | Payer: Medicare PPO | Source: Ambulatory Visit | Attending: Radiation Oncology | Admitting: Radiation Oncology

## 2021-08-05 DIAGNOSIS — Z510 Encounter for antineoplastic radiation therapy: Secondary | ICD-10-CM | POA: Diagnosis not present

## 2021-08-06 ENCOUNTER — Inpatient Hospital Stay: Payer: Medicare PPO

## 2021-08-06 ENCOUNTER — Ambulatory Visit
Admission: RE | Admit: 2021-08-06 | Discharge: 2021-08-06 | Disposition: A | Discharge status: Home / Self Care | Payer: Medicare PPO | Source: Ambulatory Visit | Attending: Radiation Oncology | Admitting: Radiation Oncology

## 2021-08-06 ENCOUNTER — Inpatient Hospital Stay: Payer: Medicare PPO | Admitting: Oncology

## 2021-08-06 DIAGNOSIS — Z510 Encounter for antineoplastic radiation therapy: Secondary | ICD-10-CM | POA: Diagnosis not present

## 2021-08-07 ENCOUNTER — Inpatient Hospital Stay: Payer: Medicare PPO

## 2021-08-07 ENCOUNTER — Other Ambulatory Visit: Payer: Self-pay | Admitting: Oncology

## 2021-08-07 ENCOUNTER — Other Ambulatory Visit: Payer: Self-pay

## 2021-08-07 ENCOUNTER — Encounter: Payer: Self-pay | Admitting: Oncology

## 2021-08-07 ENCOUNTER — Inpatient Hospital Stay (HOSPITAL_BASED_OUTPATIENT_CLINIC_OR_DEPARTMENT_OTHER): Payer: Medicare PPO | Admitting: Oncology

## 2021-08-07 ENCOUNTER — Ambulatory Visit
Admission: RE | Admit: 2021-08-07 | Discharge: 2021-08-07 | Disposition: A | Discharge status: Home / Self Care | Payer: Medicare PPO | Source: Ambulatory Visit | Attending: Radiation Oncology | Admitting: Radiation Oncology

## 2021-08-07 VITALS — BP 121/46 | HR 82 | Temp 98.8°F | Resp 18 | Wt 148.7 lb

## 2021-08-07 DIAGNOSIS — C787 Secondary malignant neoplasm of liver and intrahepatic bile duct: Secondary | ICD-10-CM

## 2021-08-07 DIAGNOSIS — C259 Malignant neoplasm of pancreas, unspecified: Secondary | ICD-10-CM | POA: Diagnosis not present

## 2021-08-07 DIAGNOSIS — Z510 Encounter for antineoplastic radiation therapy: Secondary | ICD-10-CM | POA: Diagnosis not present

## 2021-08-07 LAB — CBC WITH DIFFERENTIAL/PLATELET
Abs Immature Granulocytes: 0.03 10*3/uL (ref 0.00–0.07)
Basophils Absolute: 0 10*3/uL (ref 0.0–0.1)
Basophils Relative: 0 %
Eosinophils Absolute: 0.1 10*3/uL (ref 0.0–0.5)
Eosinophils Relative: 2 %
HCT: 32.5 % — ABNORMAL LOW (ref 36.0–46.0)
Hemoglobin: 10.2 g/dL — ABNORMAL LOW (ref 12.0–15.0)
Immature Granulocytes: 0 %
Lymphocytes Relative: 18 %
Lymphs Abs: 1.4 10*3/uL (ref 0.7–4.0)
MCH: 30 pg (ref 26.0–34.0)
MCHC: 31.4 g/dL (ref 30.0–36.0)
MCV: 95.6 fL (ref 80.0–100.0)
Monocytes Absolute: 0.7 10*3/uL (ref 0.1–1.0)
Monocytes Relative: 10 %
Neutro Abs: 5.5 10*3/uL (ref 1.7–7.7)
Neutrophils Relative %: 70 %
Platelets: 382 10*3/uL (ref 150–400)
RBC: 3.4 MIL/uL — ABNORMAL LOW (ref 3.87–5.11)
RDW: 15.4 % (ref 11.5–15.5)
WBC: 7.8 10*3/uL (ref 4.0–10.5)
nRBC: 0 % (ref 0.0–0.2)

## 2021-08-07 LAB — COMPREHENSIVE METABOLIC PANEL
ALT: 56 U/L — ABNORMAL HIGH (ref 0–44)
AST: 44 U/L — ABNORMAL HIGH (ref 15–41)
Albumin: 3.9 g/dL (ref 3.5–5.0)
Alkaline Phosphatase: 353 U/L — ABNORMAL HIGH (ref 38–126)
Anion gap: 9 (ref 5–15)
BUN: 28 mg/dL — ABNORMAL HIGH (ref 8–23)
CO2: 29 mmol/L (ref 22–32)
Calcium: 9.7 mg/dL (ref 8.9–10.3)
Chloride: 98 mmol/L (ref 98–111)
Creatinine, Ser: 1.78 mg/dL — ABNORMAL HIGH (ref 0.44–1.00)
GFR, Estimated: 29 mL/min — ABNORMAL LOW (ref >60)
Glucose, Bld: 274 mg/dL — ABNORMAL HIGH (ref 70–99)
Potassium: 4.1 mmol/L (ref 3.5–5.1)
Sodium: 136 mmol/L (ref 135–145)
Total Bilirubin: 0.7 mg/dL (ref 0.3–1.2)
Total Protein: 7.6 g/dL (ref 6.5–8.1)

## 2021-08-07 MED ORDER — HEPARIN SOD (PORK) LOCK FLUSH 100 UNIT/ML IV SOLN
500.0000 [IU] | Freq: Once | INTRAVENOUS | Status: AC
  Filled 2021-08-07: qty 5

## 2021-08-07 MED ORDER — SODIUM CHLORIDE 0.9% FLUSH
10.0000 mL | INTRAVENOUS | Status: DC | PRN
  Administered 2021-08-07: 10 mL via INTRAVENOUS
  Filled 2021-08-07: qty 10

## 2021-08-07 MED ORDER — HEPARIN SOD (PORK) LOCK FLUSH 100 UNIT/ML IV SOLN
INTRAVENOUS | Status: AC
  Administered 2021-08-07: 500 [IU] via INTRAVENOUS
  Filled 2021-08-07: qty 5

## 2021-08-07 MED ORDER — SODIUM CHLORIDE 0.9 % IV SOLN
1400.0000 mg | Freq: Once | INTRAVENOUS | Status: AC
  Administered 2021-08-07: 1400 mg via INTRAVENOUS
  Filled 2021-08-07: qty 26.3

## 2021-08-07 MED ORDER — PROCHLORPERAZINE MALEATE 10 MG PO TABS
10.0000 mg | ORAL_TABLET | Freq: Once | ORAL | Status: AC
  Administered 2021-08-07: 10 mg via ORAL
  Filled 2021-08-07: qty 1

## 2021-08-07 MED ORDER — HEPARIN SOD (PORK) LOCK FLUSH 100 UNIT/ML IV SOLN
500.0000 [IU] | Freq: Once | INTRAVENOUS | Status: DC | PRN
  Filled 2021-08-07: qty 5

## 2021-08-07 MED ORDER — SODIUM CHLORIDE 0.9 % IV SOLN
Freq: Once | INTRAVENOUS | Status: AC
  Filled 2021-08-07: qty 250

## 2021-08-07 NOTE — Progress Notes (Signed)
Labs reviewed with MD. Per MD to continue with treatment.   Helaine Yackel [employer]

## 2021-08-07 NOTE — Progress Notes (Signed)
Hematology/Oncology Consult note [facility]  Telephone:([phone] [phone]  Patient Care Team: Dion Body, MD as PCP - General (Family Medicine) Clent Jacks, RN as Oncology Nurse Navigator   Name of the patient: Rhonda Day  [id number]  Dec 28, 1940   Date of visit: 08/07/21  Diagnosis- metastatic pancreatic cancer with liver metastases   Chief complaint/ Reason for visit-on treatment assessment prior to Cycle 8-day 1 of gemcitabine chemotherapy  Heme/Onc history: Patient is a 80 year old female who underwent ultrasound abdomen in December 2021 which showed hypoechoic mass in the pancreatic tail measuring 2.1 x 1.8 x 4.5 cm which was not seen on prior CT scans on MRI exams a year ago.  This was followed by a CT abdomen and pelvis with contrast which showed ill-defined hypervascular lesion in segment 8 of liver measuring 1.6 x 1.4 cm concerning for metastases.  2 other lesions 1.7 x 1 cm and 1.5 x 0.8 cm were also noted.  Large mass in the body of pancreas measuring 5.6 x 2.2 x 2.9 cm.  The lesion causes obstruction of the pancreatic duct resulting in ductal dilatation throughout the tail of the pancreas.  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  Splenic vein appears chronically occluded.  Lesion also comes in contact with splenic artery which remains patent.  This was followed by MRI abdomen which again showed similar findings.  Multiple foci of restricted diffusion with hyperenhancement noted in the right hepatic lobe.  Prominent portocaval lymph node seen for local nodal metastases along with a 5.3 cm body and tail pancreatic mass   Case was discussed at tumor board and lab was liver biopsy 2 weeks after patient initial Covid test that was positive.  However at the day of the biopsy this lesion was in close association with other  vascular structures and given the potential higher risks liver biopsy was aborted and EUS was recommended.  She is currently on Cardizem.     Patient had a EUS at [place] By Dr. Mont Dutton. Pathology was consistent with adenocarcinoma. Patient had a PET scan on 12/11/2020 which showed a hypermetabolic lesion in the medial aspect of the liver with an SUV of 4.2 there is a subtle hypodensity on the comparison CT measuring 9 mm at this level. The hypermetabolic lesion in the dome of the liver corresponds to the enhancing lesion on the comparison MRI. 3.4 x 2.7 cm lesion in the mid body of pancreas with an SUV of 5.1. No hypermetabolic peripancreatic lymph nodes.    Patient received 1 cycle of modified FOLFIRINOX chemotherapy on 12/30/2020.  Following that patient had significant nausea vomiting fatigue and declining performance status requiring chemotherapy to be put on hold.  Patient is currently receiving gemcitabine Abraxane chemotherapy 2 weeks on and 1 week off   Interval history-cycle 7-day 8 was held last week secondary to hyperglycemia and elevated liver enzymes.  She continues to have pain around her umbilicus from the subcutaneous nodule.  Norco appears to be helping some.    ECOG PS- 1 Pain scale- 3 Opioid associated constipation- no  Review of systems- Review of Systems  Constitutional:  Positive for malaise/fatigue.  Gastrointestinal:  Positive for abdominal pain.      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   B12 deficiency 09/30/2017   Bladder infection    Cancer associated pain 01/10/2021   Chronic allergic conjunctivitis 12/02/2020   Chronic cystitis 01/26/2017   Chronic pain syndrome  12/16/2017   Controlled type 2 diabetes mellitus with chronic kidney disease, without long-term current use of insulin ([place]) 01/09/2021   Controlled type 2 diabetes mellitus without complication ([place]) 0/10/4101   Convalescence following chemotherapy 01/10/2021   Degeneration of lumbar intervertebral disc 12/16/2017   Diabetes mellitus without complication [facility])    Dysuria 12/23/2016   Essential hypertension 11/15/2015   Family history of kidney cancer    Family history of kidney cancer    Family history of lung cancer    Family history of lung cancer    Family history of stomach cancer    Family history of stomach cancer    Family history of throat cancer    Family history of throat cancer    Female stress incontinence 11/11/2012   Genetic testing 01/28/2021   Negative genetic testing. No pathogenic variants identified on the [facility] Multi-Cancer+RNA panel. VUS in [facility] called c.1978G>A and VUS in [facility] called c.983C>T were identified. The report date its 01/25/2021.  The Multi-Cancer Panel + RNA offered by [facility] includes sequencing and/or deletion duplication testing of the following 84 genes: AIP, ALK, APC, ATM, AXIN2,BAP1,  BARD1, BLM, BMPR1A, BRC   Goals of care, counseling/discussion 12/13/2020   History of atrial fibrillation 04/09/2016   History of chronic health problem 05/11/2016   Hyperlipidemia, mixed 12/09/2020   Hypertension    Incomplete bladder emptying 11/16/2012   Low back pain 11/11/2012   Low bladder compliance 11/11/2012   Lumbar post-laminectomy syndrome 12/16/2017   Medicare annual wellness visit, subsequent 09/29/2016   Menopause syndrome 12/24/2016   Other specified symptom associated with female genital organs 11/11/2012   Pancreatic cancer [facility])    with liver mass   Pancreatic cancer metastasized to liver ([place]) 12/13/2020   Pancreatic cancer metastasized to liver ([place]) 12/13/2020   Paroxysmal A-fib ([place]) 12/09/2020   Stage 3b chronic kidney disease ([place]) 03/23/2020   Symptoms  involving urinary system 11/11/2012   Type 2 diabetes mellitus ([place]) 11/15/2015   Type 2 diabetes mellitus with diabetic chronic kidney disease ([place]) 03/23/2020   Vaginal spotting 05/11/2017   Vasomotor rhinitis 12/02/2020     Past Surgical History:  Procedure Laterality Date   BACK SURGERY     PORTA CATH INSERTION N/A 12/27/2020   Procedure: PORTA CATH INSERTION;  Surgeon: Algernon Huxley, MD;  Location: [place] [facility];  Service: Cardiovascular;  Laterality: N/A;   TUBAL LIGATION      Social History   Socioeconomic History   Marital status: Married    Spouse name: Not on file   Number of children: Not on file   Years of education: Not on file   Highest education level: Not on file  Occupational History   Not on file  Tobacco Use   Smoking status: Former    Types: Cigarettes    Quit date: 11/20/1979    Years since quitting: 41.7   Smokeless tobacco: Never  Vaping Use   Vaping Use: Never used  Substance and Sexual Activity   Alcohol use: No    Alcohol/week: 0.0 standard drinks   Drug use: No   Sexual  activity: Never  Other Topics Concern   Not on file  Social History Narrative   Not on file   Social Determinants of Health   Financial Resource Strain: Not on file  Food Insecurity: Not on file  Transportation Needs: Not on file  Physical Activity: Not on file  Stress: Not on file  Social Connections: Not on file  Intimate Partner Violence: Not on file    Family History  Problem Relation Age of Onset   Kidney cancer Father 59   COPD Mother    Stomach cancer Maternal Aunt    Throat cancer Maternal Uncle    Liver cancer Paternal Uncle    Esophageal cancer Paternal Grandfather    Lung cancer Maternal Aunt    Lung cancer Maternal Uncle    Lung cancer Cousin    Kidney cancer Cousin 21   Testicular cancer Nephew        dx 20s   Breast cancer Neg Hx      Current Outpatient Medications:    azelastine (ASTELIN) 0.1 % nasal spray, Place into both nostrils daily  as needed., Disp: , Rfl:    azelastine (OPTIVAR) 0.05 % ophthalmic solution, Place into both eyes daily as needed., Disp: , Rfl: 3   diltiazem (TIAZAC) 180 MG 24 hr capsule, Take by mouth daily., Disp: , Rfl:    ferrous sulfate 325 (65 FE) MG EC tablet, Take 325 mg by mouth daily with breakfast., Disp: , Rfl:    gabapentin (NEURONTIN) 100 MG capsule, Take 1 capsule (100 mg total) by mouth at bedtime., Disp: 30 capsule, Rfl: 2   HYDROcodone-acetaminophen (NORCO) 5-325 MG tablet, Take 1 tablet by mouth every 6 (six) hours as needed for moderate pain., Disp: 60 tablet, Rfl: 0   Insulin Glargine Solostar (LANTUS) 100 UNIT/ML Solostar Pen, Inject 10 Units into the skin at bedtime., Disp: , Rfl:    LORazepam (ATIVAN) 0.5 MG tablet, TAKE 1 TABLET(0.5 MG) BY MOUTH EVERY 8 HOURS, Disp: 60 tablet, Rfl: 0   losartan (COZAAR) 100 MG tablet, Take 100 mg by mouth daily., Disp: , Rfl:    morphine (MS CONTIN) 15 MG 12 hr tablet, Take 1 tablet (15 mg total) by mouth every 12 (twelve) hours., Disp: 60 tablet, Rfl: 0   ONE TOUCH ULTRA TEST test strip, , Disp: , Rfl: 1   ONETOUCH DELICA LANCETS 09T MISC, 1 each by XX route as directed. Check CBG's fasting once daily. Dx: E11.9, Disp: , Rfl:    pantoprazole (PROTONIX) 20 MG tablet, Take 1 tablet (20 mg total) by mouth daily., Disp: 30 tablet, Rfl: 2   traZODone (DESYREL) 50 MG tablet, Take 0.5-1 tablets (25-50 mg total) by mouth at bedtime as needed for sleep., Disp: 30 tablet, Rfl: 3   glimepiride (AMARYL) 2 MG tablet, Take 2 mg by mouth daily with breakfast. (Patient not taking: No sig reported), Disp: , Rfl:    losartan (COZAAR) 25 MG tablet, Take 1 tablet by mouth daily. (Patient not taking: No sig reported), Disp: , Rfl:    sertraline (ZOLOFT) 50 MG tablet, sertraline 50 mg tablet (Patient not taking: Reported on 08/07/2021), Disp: , Rfl:  No current facility-administered medications for this visit.  Facility-Administered Medications Ordered in Other Visits:     heparin lock flush 100 UNIT/ML injection, , , ,    sodium chloride flush (NS) 0.9 % injection 10 mL, 10 mL, Intravenous, PRN, Sindy Guadeloupe, MD, 10 mL at 03/04/21 1045  Physical exam:  Vitals:  08/07/21 1029  BP: (!) 121/46  Pulse: 82  Resp: 18  Temp: 98.8 F (37.1 C)  SpO2: 98%  Weight: 148 lb 11.2 oz (67.4 kg)   Physical Exam Constitutional:      Appearance: Normal appearance.  HENT:     Head: Normocephalic and atraumatic.  Eyes:     Pupils: Pupils are equal, round, and reactive to light.  Cardiovascular:     Rate and Rhythm: Normal rate and regular rhythm.     Heart sounds: Normal heart sounds. No murmur heard. Pulmonary:     Effort: Pulmonary effort is normal.     Breath sounds: Normal breath sounds. No wheezing.  Abdominal:     General: Bowel sounds are normal. There is no distension.     Palpations: Abdomen is soft.     Tenderness: There is no abdominal tenderness.  Musculoskeletal:        General: Normal range of motion.     Cervical back: Normal range of motion.  Skin:    General: Skin is warm and dry.     Findings: No rash.  Neurological:     Mental Status: She is alert and oriented to person, place, and time.  Psychiatric:        Judgment: Judgment normal.     CMP Latest Ref Rng & Units 08/07/2021  Glucose 70 - 99 mg/dL 274(H)  BUN 8 - 23 mg/dL 28(H)  Creatinine 0.44 - 1.00 mg/dL 1.78(H)  Sodium 135 - 145 mmol/L 136  Potassium 3.5 - 5.1 mmol/L 4.1  Chloride 98 - 111 mmol/L 98  CO2 22 - 32 mmol/L 29  Calcium 8.9 - 10.3 mg/dL 9.7  Total Protein 6.5 - 8.1 g/dL 7.6  Total Bilirubin 0.3 - 1.2 mg/dL 0.7  Alkaline Phos 38 - 126 U/L 353(H)  AST 15 - 41 U/L 44(H)  ALT 0 - 44 U/L 56(H)   CBC Latest Ref Rng & Units 08/07/2021  WBC 4.0 - 10.5 K/uL 7.8  Hemoglobin 12.0 - 15.0 g/dL 10.2(L)  Hematocrit 36.0 - 46.0 % 32.5(L)  Platelets 150 - 400 K/uL 382    No images are attached to the encounter.  CT CHEST ABDOMEN PELVIS WO CONTRAST  Result Date:  07/09/2021 CLINICAL DATA:  Follow-up pancreatic cancer. Diagnosis January 2022 radiation therapy and chemotherapy complete EXAM: CT CHEST, ABDOMEN AND PELVIS WITHOUT CONTRAST TECHNIQUE: Multidetector CT imaging of the chest, abdomen and pelvis was performed following the standard protocol without IV contrast. COMPARISON:  PET-CT 04/01/2021, MRI 618 1,022 FINDINGS: CT CHEST FINDINGS Cardiovascular: Port in the anterior chest wall with tip in distal SVC. Mediastinum/Nodes: No axillary or supraclavicular adenopathy. No mediastinal or hilar adenopathy. No pericardial fluid. Esophagus normal. Stable enlargement of the RIGHT lobe of thyroid gland which extends posterior to the trachea in the thoracic inlet Lungs/Pleura: Mild scarring at the lung bases. No suspicious pulmonary nodules. Musculoskeletal: No aggressive osseous lesion. CT ABDOMEN AND PELVIS FINDINGS Hepatobiliary: 2 adjacent low-density lesions in the posterior RIGHT hepatic lobe. 14 mm lesion on image 59/series 2 corresponds to cyst on comparison MRI. Slightly more inferior 2.2 by 1.5 cm (image 61/series 2 lesion is not placed on comparison MRI or PET scan. Third, low-density lesion the dome liver measuring 1.1 cm (image 43/series 3) is present on noncontrast PET-CT scan 04/02/2019. Lesion was not hypermetabolic. Pancreas: Fullness in the mid body the pancreas similar prior measuring 3.6 x 2.8 (image 65/2) which compares to 4.9 x 2.4 cm on PET-CT scan. Difficult to evaluate lesion  without IV contrast. No periportal peripancreatic adenopathy Spleen: Normal spleen Adrenals/urinary tract: Adrenal glands and kidneys are normal. The ureters and bladder normal. Stomach/Bowel: Stomach, small bowel, appendix, and cecum are normal. The colon and rectosigmoid colon are normal. Vascular/Lymphatic: Abdominal aorta is normal caliber. There is no retroperitoneal or periportal lymphadenopathy. No pelvic lymphadenopathy. Reproductive: Uterus and adnexa unremarkable. Other:  Peritoneal or omental metastasis identified Musculoskeletal: No aggressive osseous lesion. IMPRESSION: Chest Impression: 1. No evidence thoracic metastasis Abdomen / Pelvis Impression: 1. Mass in the mid body pancreas is grossly unchanged on noncontrast CT imaging. Consider MRI evaluation (preferably with contrast). 2. One lesion in the RIGHT hepatic lobe is not clearly placed on comparison exams. Consider abdominal MRI of the liver for further evaluation (preferably with contrast). 3. No evidence of peritoneal metastasis Electronically Signed   By: Suzy Bouchard M.D.   On: 07/09/2021 11:45     Assessment and plan- Patient is a 80 y.o. female with history of metastatic pancreatic cancer with liver metastases here for on treatment assessment prior to cycle 8-day 1 of gemcitabine chemotherapy  Cycle 7-day 8 was held last week secondary to hyperglycemia and transaminitis.  She returns to clinic today for evaluation prior to cycle 8-day 1.  Lab work from today shows improvement of her kidney function, blood sugar and liver enzymes.  She is already scheduled for follow-up with Dr. Janese Banks on 08/14/21.  Okay to proceed with cycle 8-day 1 of gemcitabine chemotherapy today.  She will return to clinic in 1 week for cycle 8-day 8 and labs only and see Dr. Janese Banks in 3 weeks with labs and cycle 9-day 1 gemcitabine.    Visit Diagnosis 1. Pancreatic cancer metastasized to liver ([place])     I spent 25 minutes dedicated to the care of this patient (face-to-face and non-face-to-face) on the date of the encounter to include what is described in the assessment and plan.  Faythe Casa, NP 08/07/2021 1:29 PM

## 2021-08-07 NOTE — Patient Instructions (Signed)
[facility] [place] [facility]  Discharge Instructions: Thank you for choosing [facility] to provide your oncology and hematology care.  If you have a lab appointment with the Cancer Center, please go directly to the Cancer Center and check in at the registration area.  Wear comfortable clothing and clothing appropriate for easy access to any Portacath or PICC line.   We strive to give you quality time with your provider. You may need to reschedule your appointment if you arrive late (15 or more minutes).  Arriving late affects you and other patients whose appointments are after yours.  Also, if you miss three or more appointments without notifying the office, you may be dismissed from the clinic at the provider's discretion.      For prescription refill requests, have your pharmacy contact our office and allow 72 hours for refills to be completed.    Today you received the following chemotherapy and/or immunotherapy agents Gemzar   To help prevent nausea and vomiting after your treatment, we encourage you to take your nausea medication as directed.  BELOW ARE SYMPTOMS THAT SHOULD BE REPORTED IMMEDIATELY: *FEVER GREATER THAN 100.4 F (38 C) OR HIGHER *CHILLS OR SWEATING *NAUSEA AND VOMITING THAT IS NOT CONTROLLED WITH YOUR NAUSEA MEDICATION *UNUSUAL SHORTNESS OF BREATH *UNUSUAL BRUISING OR BLEEDING *URINARY PROBLEMS (pain or burning when urinating, or frequent urination) *BOWEL PROBLEMS (unusual diarrhea, constipation, pain near the anus) TENDERNESS IN MOUTH AND THROAT WITH OR WITHOUT PRESENCE OF ULCERS (sore throat, sores in mouth, or a toothache) UNUSUAL RASH, SWELLING OR PAIN  UNUSUAL VAGINAL DISCHARGE OR ITCHING   Items with * indicate a potential emergency and should be followed up as soon as possible or go to the Emergency Department if any problems should occur.  Please show the CHEMOTHERAPY ALERT CARD or IMMUNOTHERAPY ALERT CARD at check-in to the  Emergency Department and triage nurse.  Should you have questions after your visit or need to cancel or reschedule your appointment, please contact [facility] C[facility] [place] [facility]  [phone] and follow the prompts.  Office hours are 8:00 a.m. to 4:30 p.m. Monday - Friday. Please note that voicemails left after 4:00 p.m. may not be returned until the following business day.  We are closed weekends and major holidays. You have access to a nurse at all times for urgent questions. Please call the main number to the clinic [phone] and follow the prompts.  For any non-urgent questions, you may also contact your provider using MyChart. We now offer e-Visits for anyone 18 and older to request care online for non-urgent symptoms. For details visit [email or web address].   Also download the MyChart app! Go to the app store, search "MyChart", open the app, select [facility], and log in with your MyChart username and password.  Due to Covid, a mask is required upon entering the hospital/clinic. If you do not have a mask, one will be given to you upon arrival. For doctor visits, patients may have 1 support person aged 18 or older with them. For treatment visits, patients cannot have anyone with them due to current Covid guidelines and our immunocompromised population.  

## 2021-08-08 ENCOUNTER — Ambulatory Visit
Admission: RE | Admit: 2021-08-08 | Discharge: 2021-08-08 | Disposition: A | Discharge status: Home / Self Care | Payer: Medicare PPO | Source: Ambulatory Visit | Attending: Radiation Oncology | Admitting: Radiation Oncology

## 2021-08-08 DIAGNOSIS — Z510 Encounter for antineoplastic radiation therapy: Secondary | ICD-10-CM | POA: Diagnosis not present

## 2021-08-11 ENCOUNTER — Ambulatory Visit
Admission: RE | Admit: 2021-08-11 | Discharge: 2021-08-11 | Disposition: A | Discharge status: Home / Self Care | Payer: Medicare PPO | Source: Ambulatory Visit | Attending: Radiation Oncology | Admitting: Radiation Oncology

## 2021-08-11 DIAGNOSIS — Z510 Encounter for antineoplastic radiation therapy: Secondary | ICD-10-CM | POA: Diagnosis not present

## 2021-08-12 ENCOUNTER — Inpatient Hospital Stay: Payer: Medicare PPO

## 2021-08-12 ENCOUNTER — Inpatient Hospital Stay: Payer: Medicare PPO | Admitting: Oncology

## 2021-08-12 ENCOUNTER — Ambulatory Visit
Admission: RE | Admit: 2021-08-12 | Discharge: 2021-08-12 | Disposition: A | Discharge status: Home / Self Care | Payer: Medicare PPO | Source: Ambulatory Visit | Attending: Radiation Oncology | Admitting: Radiation Oncology

## 2021-08-12 DIAGNOSIS — Z510 Encounter for antineoplastic radiation therapy: Secondary | ICD-10-CM | POA: Diagnosis not present

## 2021-08-13 ENCOUNTER — Ambulatory Visit: Payer: Medicare PPO

## 2021-08-13 ENCOUNTER — Other Ambulatory Visit: Payer: Self-pay | Admitting: *Deleted

## 2021-08-13 ENCOUNTER — Ambulatory Visit
Admission: RE | Admit: 2021-08-13 | Discharge: 2021-08-13 | Disposition: A | Discharge status: Home / Self Care | Payer: Medicare PPO | Source: Ambulatory Visit | Attending: Radiation Oncology | Admitting: Radiation Oncology

## 2021-08-13 ENCOUNTER — Ambulatory Visit: Payer: Medicare PPO | Admitting: Oncology

## 2021-08-13 ENCOUNTER — Other Ambulatory Visit: Payer: Medicare PPO

## 2021-08-13 DIAGNOSIS — Z510 Encounter for antineoplastic radiation therapy: Secondary | ICD-10-CM | POA: Diagnosis not present

## 2021-08-13 MED ORDER — HYDROCODONE-ACETAMINOPHEN 5-325 MG PO TABS: 1.0000 | ORAL_TABLET | Freq: Four times a day (QID) | ORAL | 0 refills | Status: DC | PRN

## 2021-08-14 ENCOUNTER — Inpatient Hospital Stay: Payer: Medicare PPO

## 2021-08-14 ENCOUNTER — Ambulatory Visit
Admission: RE | Admit: 2021-08-14 | Discharge: 2021-08-14 | Disposition: A | Discharge status: Home / Self Care | Payer: Medicare PPO | Source: Ambulatory Visit | Attending: Radiation Oncology | Admitting: Radiation Oncology

## 2021-08-14 ENCOUNTER — Other Ambulatory Visit: Payer: Self-pay

## 2021-08-14 VITALS — BP 130/78 | HR 94 | Temp 97.3°F | Resp 18 | Wt 147.4 lb

## 2021-08-14 DIAGNOSIS — C787 Secondary malignant neoplasm of liver and intrahepatic bile duct: Secondary | ICD-10-CM

## 2021-08-14 DIAGNOSIS — Z510 Encounter for antineoplastic radiation therapy: Secondary | ICD-10-CM | POA: Diagnosis not present

## 2021-08-14 DIAGNOSIS — C259 Malignant neoplasm of pancreas, unspecified: Secondary | ICD-10-CM

## 2021-08-14 DIAGNOSIS — Z95828 Presence of other vascular implants and grafts: Secondary | ICD-10-CM

## 2021-08-14 LAB — CBC WITH DIFFERENTIAL/PLATELET
Abs Immature Granulocytes: 0.03 10*3/uL (ref 0.00–0.07)
Basophils Absolute: 0 10*3/uL (ref 0.0–0.1)
Basophils Relative: 0 %
Eosinophils Absolute: 0.1 10*3/uL (ref 0.0–0.5)
Eosinophils Relative: 1 %
HCT: 31.4 % — ABNORMAL LOW (ref 36.0–46.0)
Hemoglobin: 9.9 g/dL — ABNORMAL LOW (ref 12.0–15.0)
Immature Granulocytes: 1 %
Lymphocytes Relative: 40 %
Lymphs Abs: 2 10*3/uL (ref 0.7–4.0)
MCH: 30.1 pg (ref 26.0–34.0)
MCHC: 31.5 g/dL (ref 30.0–36.0)
MCV: 95.4 fL (ref 80.0–100.0)
Monocytes Absolute: 0.6 10*3/uL (ref 0.1–1.0)
Monocytes Relative: 12 %
Neutro Abs: 2.3 10*3/uL (ref 1.7–7.7)
Neutrophils Relative %: 46 %
Platelets: 319 10*3/uL (ref 150–400)
RBC: 3.29 MIL/uL — ABNORMAL LOW (ref 3.87–5.11)
RDW: 14.6 % (ref 11.5–15.5)
WBC: 4.9 10*3/uL (ref 4.0–10.5)
nRBC: 0 % (ref 0.0–0.2)

## 2021-08-14 LAB — COMPREHENSIVE METABOLIC PANEL
ALT: 58 U/L — ABNORMAL HIGH (ref 0–44)
AST: 37 U/L (ref 15–41)
Albumin: 3.9 g/dL (ref 3.5–5.0)
Alkaline Phosphatase: 394 U/L — ABNORMAL HIGH (ref 38–126)
Anion gap: 10 (ref 5–15)
BUN: 25 mg/dL — ABNORMAL HIGH (ref 8–23)
CO2: 26 mmol/L (ref 22–32)
Calcium: 9.8 mg/dL (ref 8.9–10.3)
Chloride: 98 mmol/L (ref 98–111)
Creatinine, Ser: 1.52 mg/dL — ABNORMAL HIGH (ref 0.44–1.00)
GFR, Estimated: 35 mL/min — ABNORMAL LOW (ref >60)
Glucose, Bld: 216 mg/dL — ABNORMAL HIGH (ref 70–99)
Potassium: 3.5 mmol/L (ref 3.5–5.1)
Sodium: 134 mmol/L — ABNORMAL LOW (ref 135–145)
Total Bilirubin: 0.7 mg/dL (ref 0.3–1.2)
Total Protein: 7.3 g/dL (ref 6.5–8.1)

## 2021-08-14 MED ORDER — SODIUM CHLORIDE 0.9 % IV SOLN
1400.0000 mg | Freq: Once | INTRAVENOUS | Status: AC
  Administered 2021-08-14: 1400 mg via INTRAVENOUS
  Filled 2021-08-14: qty 26.3

## 2021-08-14 MED ORDER — PROCHLORPERAZINE MALEATE 10 MG PO TABS
10.0000 mg | ORAL_TABLET | Freq: Once | ORAL | Status: AC
  Administered 2021-08-14: 10 mg via ORAL
  Filled 2021-08-14: qty 1

## 2021-08-14 MED ORDER — SODIUM CHLORIDE 0.9% FLUSH
10.0000 mL | Freq: Once | INTRAVENOUS | Status: AC
  Administered 2021-08-14: 10 mL via INTRAVENOUS
  Filled 2021-08-14: qty 10

## 2021-08-14 MED ORDER — HEPARIN SOD (PORK) LOCK FLUSH 100 UNIT/ML IV SOLN
500.0000 [IU] | Freq: Once | INTRAVENOUS | Status: DC
  Administered 2021-08-14: 500 [IU] via INTRAVENOUS
  Filled 2021-08-14: qty 5

## 2021-08-14 MED ORDER — HEPARIN SOD (PORK) LOCK FLUSH 100 UNIT/ML IV SOLN
INTRAVENOUS | Status: AC
  Filled 2021-08-14: qty 5

## 2021-08-14 MED ORDER — SODIUM CHLORIDE 0.9 % IV SOLN
Freq: Once | INTRAVENOUS | Status: AC
  Filled 2021-08-14: qty 250

## 2021-08-14 NOTE — Progress Notes (Signed)
Nutrition Follow-up:  Patient with metastatic pancreatic cancer with liver mets. Patient receiving gemcitabine.    Met with patient during infusion.  Patient reports that her appetite is good although only eats 2 meals per day.  Eats cereal  around 11am.  Later in the afternoon/evening will have sandwich or chicken pot pie or soup.  Denies nausea.  Says that she has pain around abdominal area but that it does not effect her eating.  Taking pain medication.    Medications: amaryl, lantus  Labs: glucose 216, BUN 25, creatinine  Anthropometrics:   Weight 147 lb 6.4 oz today, decreasing  155 lb on 6/28, 7/12  153 lb 4.8 oz on 5/31 148 lb 6.4 oz on 4/19  UBW 150s  5% weight loss in 3 months, concerning   NUTRITION DIAGNOSIS: Inadequate oral intake decreased with weight loss   INTERVENTION:  Recommend patient start 1-2 oral nutrition supplements daily with weight loss.  Samples of ensure plus, ensure complete, orgain and [employer] 1.4 given to patient to try along with coupons.  Would not recommend diabetic shake as lower in calories.  Would recommend adjusting diabetic medication to better control blood glucose vs diet restrictions.      MONITORING, EVALUATION, GOAL: weight trends, intake   NEXT VISIT: Tuesday, Nov 15 during infusion  Lanny Donoso B. Zenia Resides, [place], [place] Registered Dietitian [phone] (mobile)

## 2021-08-14 NOTE — Patient Instructions (Signed)
[facility]  Discharge Instructions: Thank you for choosing [facility] to provide your oncology and hematology care.  If you have a lab appointment with the [facility], please go directly to the [facility] and check in at the registration area.  Wear comfortable clothing and clothing appropriate for easy access to any Portacath or PICC line.   We strive to give you quality time with your provider. You may need to reschedule your appointment if you arrive late (15 or more minutes).  Arriving late affects you and other patients whose appointments are after yours.  Also, if you miss three or more appointments without notifying the office, you may be dismissed from the clinic at the provider's discretion.      For prescription refill requests, have your pharmacy contact our office and allow 72 hours for refills to be completed.    Today you received the following chemotherapy and/or immunotherapy agents GEMZAR      To help prevent nausea and vomiting after your treatment, we encourage you to take your nausea medication as directed.  BELOW ARE SYMPTOMS THAT SHOULD BE REPORTED IMMEDIATELY: *FEVER GREATER THAN 100.4 F (38 C) OR HIGHER *CHILLS OR SWEATING *NAUSEA AND VOMITING THAT IS NOT CONTROLLED WITH YOUR NAUSEA MEDICATION *UNUSUAL SHORTNESS OF BREATH *UNUSUAL BRUISING OR BLEEDING *URINARY PROBLEMS (pain or burning when urinating, or frequent urination) *BOWEL PROBLEMS (unusual diarrhea, constipation, pain near the anus) TENDERNESS IN MOUTH AND THROAT WITH OR WITHOUT PRESENCE OF ULCERS (sore throat, sores in mouth, or a toothache) UNUSUAL RASH, SWELLING OR PAIN  UNUSUAL VAGINAL DISCHARGE OR ITCHING   Items with * indicate a potential emergency and should be followed up as soon as possible or go to the Emergency Department if any problems should occur.  Please show the CHEMOTHERAPY ALERT CARD or IMMUNOTHERAPY ALERT CARD at check-in to  the Emergency Department and triage nurse.  Should you have questions after your visit or need to cancel or reschedule your appointment, please contact [facility]  [phone] and follow the prompts.  Office hours are 8:00 a.m. to 4:30 p.m. Monday - Friday. Please note that voicemails left after 4:00 p.m. may not be returned until the following business day.  We are closed weekends and major holidays. You have access to a nurse at all times for urgent questions. Please call the main number to the clinic [phone] and follow the prompts.  For any non-urgent questions, you may also contact your provider using MyChart. We now offer e-Visits for anyone 67 and older to request care online for non-urgent symptoms. For details visit [email or web address].   Also download the MyChart app! Go to the app store, search "MyChart", open the app, select [facility], and log in with your MyChart username and password.  Due to Covid, a mask is required upon entering the hospital/clinic. If you do not have a mask, one will be given to you upon arrival. For doctor visits, patients may have 1 support person aged 22 or older with them. For treatment visits, patients cannot have anyone with them due to current Covid guidelines and our immunocompromised population.   Gemcitabine injection What is this medication? GEMCITABINE (jem SYE ta been) is a chemotherapy drug. This medicine is used to treat many types of cancer like breast cancer, lung cancer, pancreatic cancer, and ovarian cancer. This medicine may be used for other purposes; ask your health care provider or pharmacist if you have questions. COMMON  BRAND NAME(S): Gemzar, Infugem What should I tell my care team before I take this medication? They need to know if you have any of these conditions: blood disorders infection kidney disease liver disease lung or breathing disease, like asthma recent or ongoing radiation  therapy an unusual or allergic reaction to gemcitabine, other chemotherapy, other medicines, foods, dyes, or preservatives pregnant or trying to get pregnant breast-feeding How should I use this medication? This drug is given as an infusion into a vein. It is administered in a hospital or clinic by a specially trained health care professional. Talk to your pediatrician regarding the use of this medicine in children. Special care may be needed. Overdosage: If you think you have taken too much of this medicine contact a poison control center or emergency room at once. NOTE: This medicine is only for you. Do not share this medicine with others. What if I miss a dose? It is important not to miss your dose. Call your doctor or health care professional if you are unable to keep an appointment. What may interact with this medication? medicines to increase blood counts like filgrastim, pegfilgrastim, sargramostim some other chemotherapy drugs like cisplatin vaccines Talk to your doctor or health care professional before taking any of these medicines: acetaminophen aspirin ibuprofen ketoprofen naproxen This list may not describe all possible interactions. Give your health care provider a list of all the medicines, herbs, non-prescription drugs, or dietary supplements you use. Also tell them if you smoke, drink alcohol, or use illegal drugs. Some items may interact with your medicine. What should I watch for while using this medication? Visit your doctor for checks on your progress. This drug may make you feel generally unwell. This is not uncommon, as chemotherapy can affect healthy cells as well as cancer cells. Report any side effects. Continue your course of treatment even though you feel ill unless your doctor tells you to stop. In some cases, you may be given additional medicines to help with side effects. Follow all directions for their use. Call your doctor or health care professional for  advice if you get a fever, chills or sore throat, or other symptoms of a cold or flu. Do not treat yourself. This drug decreases your body's ability to fight infections. Try to avoid being around people who are sick. This medicine may increase your risk to bruise or bleed. Call your doctor or health care professional if you notice any unusual bleeding. Be careful brushing and flossing your teeth or using a toothpick because you may get an infection or bleed more easily. If you have any dental work done, tell your dentist you are receiving this medicine. Avoid taking products that contain aspirin, acetaminophen, ibuprofen, naproxen, or ketoprofen unless instructed by your doctor. These medicines may hide a fever. Do not become pregnant while taking this medicine or for 6 months after stopping it. Women should inform their doctor if they wish to become pregnant or think they might be pregnant. Men should not father a child while taking this medicine and for 3 months after stopping it. There is a potential for serious side effects to an unborn child. Talk to your health care professional or pharmacist for more information. Do not breast-feed an infant while taking this medicine or for at least 1 week after stopping it. Men should inform their doctors if they wish to father a child. This medicine may lower sperm counts. Talk with your doctor or health care professional if you are concerned about  your fertility. What side effects may I notice from receiving this medication? Side effects that you should report to your doctor or health care professional as soon as possible: allergic reactions like skin rash, itching or hives, swelling of the face, lips, or tongue breathing problems pain, redness, or irritation at site where injected signs and symptoms of a dangerous change in heartbeat or heart rhythm like chest pain; dizziness; fast or irregular heartbeat; palpitations; feeling faint or lightheaded, falls;  breathing problems signs of decreased platelets or bleeding - bruising, pinpoint red spots on the skin, black, tarry stools, blood in the urine signs of decreased red blood cells - unusually weak or tired, feeling faint or lightheaded, falls signs of infection - fever or chills, cough, sore throat, pain or difficulty passing urine signs and symptoms of kidney injury like trouble passing urine or change in the amount of urine signs and symptoms of liver injury like dark yellow or brown urine; general ill feeling or flu-like symptoms; light-colored stools; loss of appetite; nausea; right upper belly pain; unusually weak or tired; yellowing of the eyes or skin swelling of ankles, feet, hands Side effects that usually do not require medical attention (report to your doctor or health care professional if they continue or are bothersome): constipation diarrhea hair loss loss of appetite nausea rash vomiting This list may not describe all possible side effects. Call your doctor for medical advice about side effects. You may report side effects to FDA at 1-800-FDA-1088. Where should I keep my medication? This drug is given in a hospital or clinic and will not be stored at home. NOTE: This sheet is a summary. It may not cover all possible information. If you have questions about this medicine, talk to your doctor, pharmacist, or health care provider.  2022 Elsevier/Gold Standard (2017-12-29 18:06:11)

## 2021-08-14 NOTE — Progress Notes (Signed)
Ok to proceed with Gemzar and Creatine of 1.52 per Beckey Rutter PA

## 2021-08-15 ENCOUNTER — Ambulatory Visit
Admission: RE | Admit: 2021-08-15 | Discharge: 2021-08-15 | Disposition: A | Discharge status: Home / Self Care | Payer: Medicare PPO | Source: Ambulatory Visit | Attending: Radiation Oncology | Admitting: Radiation Oncology

## 2021-08-15 DIAGNOSIS — Z510 Encounter for antineoplastic radiation therapy: Secondary | ICD-10-CM | POA: Diagnosis not present

## 2021-08-25 ENCOUNTER — Encounter: Payer: Self-pay | Admitting: Hospice and Palliative Medicine

## 2021-08-25 ENCOUNTER — Inpatient Hospital Stay: Payer: Medicare PPO | Attending: Oncology | Admitting: Hospice and Palliative Medicine

## 2021-08-25 DIAGNOSIS — C259 Malignant neoplasm of pancreas, unspecified: Secondary | ICD-10-CM | POA: Insufficient documentation

## 2021-08-25 DIAGNOSIS — C787 Secondary malignant neoplasm of liver and intrahepatic bile duct: Secondary | ICD-10-CM | POA: Insufficient documentation

## 2021-08-25 DIAGNOSIS — G893 Neoplasm related pain (acute) (chronic): Secondary | ICD-10-CM | POA: Diagnosis not present

## 2021-08-25 DIAGNOSIS — Z515 Encounter for palliative care: Secondary | ICD-10-CM

## 2021-08-25 DIAGNOSIS — Z5111 Encounter for antineoplastic chemotherapy: Secondary | ICD-10-CM | POA: Insufficient documentation

## 2021-08-25 NOTE — Progress Notes (Signed)
Virtual Visit via Telephone Note  I connected with Rhonda Day on 08/25/21 at  1:00 PM EST by telephone and verified that I am speaking with the correct person using two identifiers.  Location: Patient: home Provider: clinic   I discussed the limitations, risks, security and privacy concerns of performing an evaluation and management service by telephone and the availability of in person appointments. I also discussed with the patient that there may be a patient responsible charge related to this service. The patient expressed understanding and agreed to proceed.   History of Present Illness: Rhonda Day is a 80 y.o. 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  Patient is referred to palliative care to help address goals and manage ongoing symptoms.   Observations/Objective: I spoke with patient by phone.  She reports she is doing reasonably well.  She denies any significant changes or concerns.  She endorses occasional musculoskeletal pain in the ribs and scapula for which she takes Norco with good relief.  She is also using heating pack on occasion.  She says she has more good days than bad.  Appetite is stable.  She has follow-up scheduled with Dr. Janese Banks next week.  Assessment and Plan: Stage IV pancreatic cancer -on systemic chemotherapy.  Symptomatically stable  Neoplasm related pain -continue Norco as needed.  This was recently refilled.  Follow Up Instructions: Follow-up MyChart/telephone visit 1 month   I discussed the assessment and treatment plan with the patient. The patient was provided an opportunity to ask questions and all were answered. The patient agreed with the plan and demonstrated an understanding of the instructions.   The patient was advised to call back or seek an in-person evaluation if the symptoms worsen or if the condition fails to  improve as anticipated.  I provided 10 minutes of non-face-to-face time during this encounter.   Irean Hong, NP

## 2021-08-27 ENCOUNTER — Other Ambulatory Visit: Payer: Self-pay | Admitting: Oncology

## 2021-08-28 LAB — CANCER ANTIGEN 19-9

## 2021-09-02 ENCOUNTER — Other Ambulatory Visit: Payer: Self-pay | Admitting: *Deleted

## 2021-09-02 ENCOUNTER — Other Ambulatory Visit: Payer: Self-pay

## 2021-09-02 ENCOUNTER — Inpatient Hospital Stay: Payer: Medicare PPO | Admitting: Oncology

## 2021-09-02 ENCOUNTER — Inpatient Hospital Stay: Payer: Medicare PPO

## 2021-09-02 ENCOUNTER — Encounter: Payer: Self-pay | Admitting: Oncology

## 2021-09-02 VITALS — BP 118/72 | HR 88 | Temp 97.7°F | Resp 16 | Ht 69.0 in | Wt 147.9 lb

## 2021-09-02 DIAGNOSIS — C787 Secondary malignant neoplasm of liver and intrahepatic bile duct: Secondary | ICD-10-CM

## 2021-09-02 DIAGNOSIS — C259 Malignant neoplasm of pancreas, unspecified: Secondary | ICD-10-CM

## 2021-09-02 DIAGNOSIS — Z87891 Personal history of nicotine dependence: Secondary | ICD-10-CM

## 2021-09-02 DIAGNOSIS — G893 Neoplasm related pain (acute) (chronic): Secondary | ICD-10-CM | POA: Diagnosis not present

## 2021-09-02 DIAGNOSIS — N189 Chronic kidney disease, unspecified: Secondary | ICD-10-CM

## 2021-09-02 DIAGNOSIS — Z800 Family history of malignant neoplasm of digestive organs: Secondary | ICD-10-CM

## 2021-09-02 DIAGNOSIS — Z5111 Encounter for antineoplastic chemotherapy: Secondary | ICD-10-CM

## 2021-09-02 DIAGNOSIS — Z801 Family history of malignant neoplasm of trachea, bronchus and lung: Secondary | ICD-10-CM

## 2021-09-02 DIAGNOSIS — E1122 Type 2 diabetes mellitus with diabetic chronic kidney disease: Secondary | ICD-10-CM

## 2021-09-02 DIAGNOSIS — Z808 Family history of malignant neoplasm of other organs or systems: Secondary | ICD-10-CM

## 2021-09-02 DIAGNOSIS — Z8051 Family history of malignant neoplasm of kidney: Secondary | ICD-10-CM

## 2021-09-02 DIAGNOSIS — Z7189 Other specified counseling: Secondary | ICD-10-CM

## 2021-09-02 DIAGNOSIS — E860 Dehydration: Secondary | ICD-10-CM

## 2021-09-02 DIAGNOSIS — Z8043 Family history of malignant neoplasm of testis: Secondary | ICD-10-CM

## 2021-09-02 DIAGNOSIS — I129 Hypertensive chronic kidney disease with stage 1 through stage 4 chronic kidney disease, or unspecified chronic kidney disease: Secondary | ICD-10-CM

## 2021-09-02 LAB — COMPREHENSIVE METABOLIC PANEL
ALT: 92 U/L — ABNORMAL HIGH (ref 0–44)
AST: 63 U/L — ABNORMAL HIGH (ref 15–41)
Albumin: 4 g/dL (ref 3.5–5.0)
Alkaline Phosphatase: 440 U/L — ABNORMAL HIGH (ref 38–126)
Anion gap: 14 (ref 5–15)
BUN: 26 mg/dL — ABNORMAL HIGH (ref 8–23)
CO2: 24 mmol/L (ref 22–32)
Calcium: 9.7 mg/dL (ref 8.9–10.3)
Chloride: 95 mmol/L — ABNORMAL LOW (ref 98–111)
Creatinine, Ser: 1.42 mg/dL — ABNORMAL HIGH (ref 0.44–1.00)
GFR, Estimated: 38 mL/min — ABNORMAL LOW (ref >60)
Glucose, Bld: 276 mg/dL — ABNORMAL HIGH (ref 70–99)
Potassium: 3.5 mmol/L (ref 3.5–5.1)
Sodium: 133 mmol/L — ABNORMAL LOW (ref 135–145)
Total Bilirubin: 0.7 mg/dL (ref 0.3–1.2)
Total Protein: 7.9 g/dL (ref 6.5–8.1)

## 2021-09-02 LAB — CBC WITH DIFFERENTIAL/PLATELET
Abs Immature Granulocytes: 0.02 10*3/uL (ref 0.00–0.07)
Basophils Absolute: 0 10*3/uL (ref 0.0–0.1)
Basophils Relative: 0 %
Eosinophils Absolute: 0.1 10*3/uL (ref 0.0–0.5)
Eosinophils Relative: 2 %
HCT: 33.7 % — ABNORMAL LOW (ref 36.0–46.0)
Hemoglobin: 10.6 g/dL — ABNORMAL LOW (ref 12.0–15.0)
Immature Granulocytes: 0 %
Lymphocytes Relative: 12 %
Lymphs Abs: 0.9 10*3/uL (ref 0.7–4.0)
MCH: 30 pg (ref 26.0–34.0)
MCHC: 31.5 g/dL (ref 30.0–36.0)
MCV: 95.5 fL (ref 80.0–100.0)
Monocytes Absolute: 0.6 10*3/uL (ref 0.1–1.0)
Monocytes Relative: 7 %
Neutro Abs: 6.1 10*3/uL (ref 1.7–7.7)
Neutrophils Relative %: 79 %
Platelets: 310 10*3/uL (ref 150–400)
RBC: 3.53 MIL/uL — ABNORMAL LOW (ref 3.87–5.11)
RDW: 14.9 % (ref 11.5–15.5)
Smear Review: NORMAL
WBC: 7.8 10*3/uL (ref 4.0–10.5)
nRBC: 0 % (ref 0.0–0.2)

## 2021-09-02 MED ORDER — PANTOPRAZOLE SODIUM 20 MG PO TBEC: 20.0000 mg | DELAYED_RELEASE_TABLET | Freq: Every day | ORAL | 2 refills | Status: AC

## 2021-09-02 MED ORDER — HEPARIN SOD (PORK) LOCK FLUSH 100 UNIT/ML IV SOLN
500.0000 [IU] | Freq: Once | INTRAVENOUS | Status: AC | PRN
  Administered 2021-09-02: 500 [IU]
  Filled 2021-09-02: qty 5

## 2021-09-02 MED ORDER — SODIUM CHLORIDE 0.9 % IV SOLN
1400.0000 mg | Freq: Once | INTRAVENOUS | Status: AC
  Administered 2021-09-02: 1400 mg via INTRAVENOUS
  Filled 2021-09-02: qty 10.52

## 2021-09-02 MED ORDER — PROCHLORPERAZINE MALEATE 10 MG PO TABS
10.0000 mg | ORAL_TABLET | Freq: Once | ORAL | Status: AC
  Administered 2021-09-02: 10 mg via ORAL
  Filled 2021-09-02: qty 1

## 2021-09-02 MED ORDER — SODIUM CHLORIDE 0.9 % IV SOLN
Freq: Once | INTRAVENOUS | Status: AC
  Filled 2021-09-02: qty 250

## 2021-09-02 MED ORDER — SODIUM CHLORIDE 0.9 % IV SOLN
Freq: Once | INTRAVENOUS | Status: AC
Start: 2021-09-02 — End: 2021-09-02
  Filled 2021-09-02: qty 250

## 2021-09-02 MED ORDER — TRAZODONE HCL 50 MG PO TABS: 25.0000 mg | ORAL_TABLET | Freq: Every evening | ORAL | 3 refills | Status: DC | PRN

## 2021-09-02 NOTE — Progress Notes (Signed)
Nutrition Follow-up:  Patient with metastatic pancreatic cancer with liver mets.  Patient receiving gemcitabine  Met with patient during infusion.  Patient like the ensure samples the best and she has started drinking 1 a day (in the afternoon).  Continues with mostly 2 meals per day.  Reports some issues with nausea and bloating says that MD is going to try new medication  Medications: reviewed  Labs: reviewed  Anthropometrics:   Weight 147 lb 14.4 oz today  155 lb on 6/28, 7/12 153 lb 4.8 oz on 5/31 148 lb 6.4 oz on 4/19   NUTRITION DIAGNOSIS: Inadequate oral intake stable    INTERVENTION:  Continue ensure plus at least daily for weight maintenance.     MONITORING, EVALUATION, GOAL: weight trends, intake   NEXT VISIT: Tuesday, Dec 6 during infusion  Joli B. Allen, RD, LDN Registered Dietitian [phone] (mobile)   

## 2021-09-02 NOTE — Progress Notes (Signed)
Per secures chat at [phone] with pharmacy and Dr Janese Banks. O to infuse with today's liver enzyme ALT 92.

## 2021-09-02 NOTE — Patient Instructions (Signed)
[facility]  Discharge Instructions: Thank you for choosing [facility] to provide your oncology and hematology care.  If you have a lab appointment with the [facility], please go directly to the [facility] and check in at the registration area.  Wear comfortable clothing and clothing appropriate for easy access to any Portacath or PICC line.   We strive to give you quality time with your provider. You may need to reschedule your appointment if you arrive late (15 or more minutes).  Arriving late affects you and other patients whose appointments are after yours.  Also, if you miss three or more appointments without notifying the office, you may be dismissed from the clinic at the provider's discretion.      For prescription refill requests, have your pharmacy contact our office and allow 72 hours for refills to be completed.    Today you received the following chemotherapy and/or immunotherapy agents GEMZAR      To help prevent nausea and vomiting after your treatment, we encourage you to take your nausea medication as directed.  BELOW ARE SYMPTOMS THAT SHOULD BE REPORTED IMMEDIATELY: *FEVER GREATER THAN 100.4 F (38 C) OR HIGHER *CHILLS OR SWEATING *NAUSEA AND VOMITING THAT IS NOT CONTROLLED WITH YOUR NAUSEA MEDICATION *UNUSUAL SHORTNESS OF BREATH *UNUSUAL BRUISING OR BLEEDING *URINARY PROBLEMS (pain or burning when urinating, or frequent urination) *BOWEL PROBLEMS (unusual diarrhea, constipation, pain near the anus) TENDERNESS IN MOUTH AND THROAT WITH OR WITHOUT PRESENCE OF ULCERS (sore throat, sores in mouth, or a toothache) UNUSUAL RASH, SWELLING OR PAIN  UNUSUAL VAGINAL DISCHARGE OR ITCHING   Items with * indicate a potential emergency and should be followed up as soon as possible or go to the Emergency Department if any problems should occur.  Please show the CHEMOTHERAPY ALERT CARD or IMMUNOTHERAPY ALERT CARD at check-in to  the Emergency Department and triage nurse.  Should you have questions after your visit or need to cancel or reschedule your appointment, please contact [facility]  [phone] and follow the prompts.  Office hours are 8:00 a.m. to 4:30 p.m. Monday - Friday. Please note that voicemails left after 4:00 p.m. may not be returned until the following business day.  We are closed weekends and major holidays. You have access to a nurse at all times for urgent questions. Please call the main number to the clinic [phone] and follow the prompts.  For any non-urgent questions, you may also contact your provider using MyChart. We now offer e-Visits for anyone 67 and older to request care online for non-urgent symptoms. For details visit [email or web address].   Also download the MyChart app! Go to the app store, search "MyChart", open the app, select [facility], and log in with your MyChart username and password.  Due to Covid, a mask is required upon entering the hospital/clinic. If you do not have a mask, one will be given to you upon arrival. For doctor visits, patients may have 1 support person aged 22 or older with them. For treatment visits, patients cannot have anyone with them due to current Covid guidelines and our immunocompromised population.   Gemcitabine injection What is this medication? GEMCITABINE (jem SYE ta been) is a chemotherapy drug. This medicine is used to treat many types of cancer like breast cancer, lung cancer, pancreatic cancer, and ovarian cancer. This medicine may be used for other purposes; ask your health care provider or pharmacist if you have questions. COMMON  BRAND NAME(S): Gemzar, Infugem What should I tell my care team before I take this medication? They need to know if you have any of these conditions: blood disorders infection kidney disease liver disease lung or breathing disease, like asthma recent or ongoing radiation  therapy an unusual or allergic reaction to gemcitabine, other chemotherapy, other medicines, foods, dyes, or preservatives pregnant or trying to get pregnant breast-feeding How should I use this medication? This drug is given as an infusion into a vein. It is administered in a hospital or clinic by a specially trained health care professional. Talk to your pediatrician regarding the use of this medicine in children. Special care may be needed. Overdosage: If you think you have taken too much of this medicine contact a poison control center or emergency room at once. NOTE: This medicine is only for you. Do not share this medicine with others. What if I miss a dose? It is important not to miss your dose. Call your doctor or health care professional if you are unable to keep an appointment. What may interact with this medication? medicines to increase blood counts like filgrastim, pegfilgrastim, sargramostim some other chemotherapy drugs like cisplatin vaccines Talk to your doctor or health care professional before taking any of these medicines: acetaminophen aspirin ibuprofen ketoprofen naproxen This list may not describe all possible interactions. Give your health care provider a list of all the medicines, herbs, non-prescription drugs, or dietary supplements you use. Also tell them if you smoke, drink alcohol, or use illegal drugs. Some items may interact with your medicine. What should I watch for while using this medication? Visit your doctor for checks on your progress. This drug may make you feel generally unwell. This is not uncommon, as chemotherapy can affect healthy cells as well as cancer cells. Report any side effects. Continue your course of treatment even though you feel ill unless your doctor tells you to stop. In some cases, you may be given additional medicines to help with side effects. Follow all directions for their use. Call your doctor or health care professional for  advice if you get a fever, chills or sore throat, or other symptoms of a cold or flu. Do not treat yourself. This drug decreases your body's ability to fight infections. Try to avoid being around people who are sick. This medicine may increase your risk to bruise or bleed. Call your doctor or health care professional if you notice any unusual bleeding. Be careful brushing and flossing your teeth or using a toothpick because you may get an infection or bleed more easily. If you have any dental work done, tell your dentist you are receiving this medicine. Avoid taking products that contain aspirin, acetaminophen, ibuprofen, naproxen, or ketoprofen unless instructed by your doctor. These medicines may hide a fever. Do not become pregnant while taking this medicine or for 6 months after stopping it. Women should inform their doctor if they wish to become pregnant or think they might be pregnant. Men should not father a child while taking this medicine and for 3 months after stopping it. There is a potential for serious side effects to an unborn child. Talk to your health care professional or pharmacist for more information. Do not breast-feed an infant while taking this medicine or for at least 1 week after stopping it. Men should inform their doctors if they wish to father a child. This medicine may lower sperm counts. Talk with your doctor or health care professional if you are concerned about  your fertility. What side effects may I notice from receiving this medication? Side effects that you should report to your doctor or health care professional as soon as possible: allergic reactions like skin rash, itching or hives, swelling of the face, lips, or tongue breathing problems pain, redness, or irritation at site where injected signs and symptoms of a dangerous change in heartbeat or heart rhythm like chest pain; dizziness; fast or irregular heartbeat; palpitations; feeling faint or lightheaded, falls;  breathing problems signs of decreased platelets or bleeding - bruising, pinpoint red spots on the skin, black, tarry stools, blood in the urine signs of decreased red blood cells - unusually weak or tired, feeling faint or lightheaded, falls signs of infection - fever or chills, cough, sore throat, pain or difficulty passing urine signs and symptoms of kidney injury like trouble passing urine or change in the amount of urine signs and symptoms of liver injury like dark yellow or brown urine; general ill feeling or flu-like symptoms; light-colored stools; loss of appetite; nausea; right upper belly pain; unusually weak or tired; yellowing of the eyes or skin swelling of ankles, feet, hands Side effects that usually do not require medical attention (report to your doctor or health care professional if they continue or are bothersome): constipation diarrhea hair loss loss of appetite nausea rash vomiting This list may not describe all possible side effects. Call your doctor for medical advice about side effects. You may report side effects to FDA at 1-800-FDA-1088. Where should I keep my medication? This drug is given in a hospital or clinic and will not be stored at home. NOTE: This sheet is a summary. It may not cover all possible information. If you have questions about this medicine, talk to your doctor, pharmacist, or health care provider.  2022 Elsevier/Gold Standard (2017-12-29 00:00:00)

## 2021-09-02 NOTE — Progress Notes (Signed)
Pt had bloating today. She says never done it before. She feels eating and drinking good. Has 2 BM a week is her standard. She also says that she has shoulder pain and neck pain often and uses sal[place]as, and neck.

## 2021-09-03 ENCOUNTER — Encounter: Payer: Self-pay | Admitting: Oncology

## 2021-09-03 MED ORDER — OXYCODONE HCL 10 MG PO TABS: 10.0000 mg | ORAL_TABLET | Freq: Four times a day (QID) | ORAL | 0 refills | Status: DC | PRN

## 2021-09-03 MED ORDER — LORAZEPAM 0.5 MG PO TABS: 0.5000 mg | ORAL_TABLET | Freq: Three times a day (TID) | ORAL | 0 refills | Status: AC | PRN

## 2021-09-09 ENCOUNTER — Inpatient Hospital Stay: Payer: Medicare PPO

## 2021-09-09 ENCOUNTER — Other Ambulatory Visit: Payer: Self-pay

## 2021-09-09 VITALS — BP 128/55 | HR 65 | Temp 96.2°F | Resp 20 | Wt 150.0 lb

## 2021-09-09 DIAGNOSIS — C259 Malignant neoplasm of pancreas, unspecified: Secondary | ICD-10-CM

## 2021-09-09 DIAGNOSIS — Z5111 Encounter for antineoplastic chemotherapy: Secondary | ICD-10-CM | POA: Diagnosis not present

## 2021-09-09 LAB — CBC WITH DIFFERENTIAL/PLATELET
Abs Immature Granulocytes: 0.02 10*3/uL (ref 0.00–0.07)
Basophils Absolute: 0 10*3/uL (ref 0.0–0.1)
Basophils Relative: 0 %
Eosinophils Absolute: 0 10*3/uL (ref 0.0–0.5)
Eosinophils Relative: 1 %
HCT: 30.7 % — ABNORMAL LOW (ref 36.0–46.0)
Hemoglobin: 9.7 g/dL — ABNORMAL LOW (ref 12.0–15.0)
Immature Granulocytes: 0 %
Lymphocytes Relative: 24 %
Lymphs Abs: 1.2 10*3/uL (ref 0.7–4.0)
MCH: 30.9 pg (ref 26.0–34.0)
MCHC: 31.6 g/dL (ref 30.0–36.0)
MCV: 97.8 fL (ref 80.0–100.0)
Monocytes Absolute: 0.6 10*3/uL (ref 0.1–1.0)
Monocytes Relative: 12 %
Neutro Abs: 3.1 10*3/uL (ref 1.7–7.7)
Neutrophils Relative %: 63 %
Platelets: 230 10*3/uL (ref 150–400)
RBC: 3.14 MIL/uL — ABNORMAL LOW (ref 3.87–5.11)
RDW: 14.3 % (ref 11.5–15.5)
WBC: 5 10*3/uL (ref 4.0–10.5)
nRBC: 0 % (ref 0.0–0.2)

## 2021-09-09 LAB — COMPREHENSIVE METABOLIC PANEL
ALT: 162 U/L — ABNORMAL HIGH (ref 0–44)
AST: 114 U/L — ABNORMAL HIGH (ref 15–41)
Albumin: 3.8 g/dL (ref 3.5–5.0)
Alkaline Phosphatase: 550 U/L — ABNORMAL HIGH (ref 38–126)
Anion gap: 10 (ref 5–15)
BUN: 30 mg/dL — ABNORMAL HIGH (ref 8–23)
CO2: 22 mmol/L (ref 22–32)
Calcium: 8.6 mg/dL — ABNORMAL LOW (ref 8.9–10.3)
Chloride: 92 mmol/L — ABNORMAL LOW (ref 98–111)
Creatinine, Ser: 1.71 mg/dL — ABNORMAL HIGH (ref 0.44–1.00)
GFR, Estimated: 30 mL/min — ABNORMAL LOW (ref >60)
Glucose, Bld: 362 mg/dL — ABNORMAL HIGH (ref 70–99)
Potassium: 3.9 mmol/L (ref 3.5–5.1)
Sodium: 124 mmol/L — ABNORMAL LOW (ref 135–145)
Total Bilirubin: 1.1 mg/dL (ref 0.3–1.2)
Total Protein: 7.6 g/dL (ref 6.5–8.1)

## 2021-09-09 MED ORDER — SODIUM CHLORIDE 0.9% FLUSH
10.0000 mL | INTRAVENOUS | Status: DC | PRN
  Filled 2021-09-09: qty 10

## 2021-09-09 MED ORDER — HEPARIN SOD (PORK) LOCK FLUSH 100 UNIT/ML IV SOLN
INTRAVENOUS | Status: AC
  Filled 2021-09-09: qty 5

## 2021-09-09 MED ORDER — SODIUM CHLORIDE 0.9 % IV SOLN
Freq: Once | INTRAVENOUS | Status: AC
  Filled 2021-09-09: qty 250

## 2021-09-09 MED ORDER — PROCHLORPERAZINE MALEATE 10 MG PO TABS
10.0000 mg | ORAL_TABLET | Freq: Once | ORAL | Status: AC
  Administered 2021-09-09: 10 mg via ORAL
  Filled 2021-09-09: qty 1

## 2021-09-09 MED ORDER — HEPARIN SOD (PORK) LOCK FLUSH 100 UNIT/ML IV SOLN
500.0000 [IU] | Freq: Once | INTRAVENOUS | Status: AC | PRN
  Administered 2021-09-09: 500 [IU]
  Filled 2021-09-09: qty 5

## 2021-09-09 MED ORDER — SODIUM CHLORIDE 0.9 % IV SOLN
1400.0000 mg | Freq: Once | INTRAVENOUS | Status: AC
  Administered 2021-09-09: 1400 mg via INTRAVENOUS
  Filled 2021-09-09: qty 26.3

## 2021-09-09 NOTE — Patient Instructions (Signed)
[facility]  Discharge Instructions: Thank you for choosing [facility] to provide your oncology and hematology care.  If you have a lab appointment with the [facility], please go directly to the [facility] and check in at the registration area.  Wear comfortable clothing and clothing appropriate for easy access to any Portacath or PICC line.   We strive to give you quality time with your provider. You may need to reschedule your appointment if you arrive late (15 or more minutes).  Arriving late affects you and other patients whose appointments are after yours.  Also, if you miss three or more appointments without notifying the office, you may be dismissed from the clinic at the provider's discretion.      For prescription refill requests, have your pharmacy contact our office and allow 72 hours for refills to be completed.    Today you received the following chemotherapy and/or immunotherapy agents: Gemzar      To help prevent nausea and vomiting after your treatment, we encourage you to take your nausea medication as directed.  BELOW ARE SYMPTOMS THAT SHOULD BE REPORTED IMMEDIATELY: *FEVER GREATER THAN 100.4 F (38 C) OR HIGHER *CHILLS OR SWEATING *NAUSEA AND VOMITING THAT IS NOT CONTROLLED WITH YOUR NAUSEA MEDICATION *UNUSUAL SHORTNESS OF BREATH *UNUSUAL BRUISING OR BLEEDING *URINARY PROBLEMS (pain or burning when urinating, or frequent urination) *BOWEL PROBLEMS (unusual diarrhea, constipation, pain near the anus) TENDERNESS IN MOUTH AND THROAT WITH OR WITHOUT PRESENCE OF ULCERS (sore throat, sores in mouth, or a toothache) UNUSUAL RASH, SWELLING OR PAIN  UNUSUAL VAGINAL DISCHARGE OR ITCHING   Items with * indicate a potential emergency and should be followed up as soon as possible or go to the Emergency Department if any problems should occur.  Please show the CHEMOTHERAPY ALERT CARD or IMMUNOTHERAPY ALERT CARD at check-in to  the Emergency Department and triage nurse.  Should you have questions after your visit or need to cancel or reschedule your appointment, please contact [address]  [phone] and follow the prompts.  Office hours are 8:00 a.m. to 4:30 p.m. Monday - Friday. Please note that voicemails left after 4:00 p.m. may not be returned until the following business day.  We are closed weekends and major holidays. You have access to a nurse at all times for urgent questions. Please call the main number to the clinic [phone] and follow the prompts.  For any non-urgent questions, you may also contact your provider using MyChart. We now offer e-Visits for anyone 57 and older to request care online for non-urgent symptoms. For details visit [email or web address].   Also download the MyChart app! Go to the app store, search "MyChart", open the app, select [facility], and log in with your MyChart username and password.  Due to Covid, a mask is required upon entering the hospital/clinic. If you do not have a mask, one will be given to you upon arrival. For doctor visits, patients may have 1 support person aged 85 or older with them. For treatment visits, patients cannot have anyone with them due to current Covid guidelines and our immunocompromised population. Gemcitabine injection What is this medication? GEMCITABINE (jem SYE ta been) is a chemotherapy drug. This medicine is used to treat many types of cancer like breast cancer, lung cancer, pancreatic cancer, and ovarian cancer. This medicine may be used for other purposes; ask your health care provider or pharmacist if you have questions. COMMON BRAND NAME(S):  Gemzar, Infugem What should I tell my care team before I take this medication? They need to know if you have any of these conditions: blood disorders infection kidney disease liver disease lung or breathing disease, like asthma recent or ongoing radiation  therapy an unusual or allergic reaction to gemcitabine, other chemotherapy, other medicines, foods, dyes, or preservatives pregnant or trying to get pregnant breast-feeding How should I use this medication? This drug is given as an infusion into a vein. It is administered in a hospital or clinic by a specially trained health care professional. Talk to your pediatrician regarding the use of this medicine in children. Special care may be needed. Overdosage: If you think you have taken too much of this medicine contact a poison control center or emergency room at once. NOTE: This medicine is only for you. Do not share this medicine with others. What if I miss a dose? It is important not to miss your dose. Call your doctor or health care professional if you are unable to keep an appointment. What may interact with this medication? medicines to increase blood counts like filgrastim, pegfilgrastim, sargramostim some other chemotherapy drugs like cisplatin vaccines Talk to your doctor or health care professional before taking any of these medicines: acetaminophen aspirin ibuprofen ketoprofen naproxen This list may not describe all possible interactions. Give your health care provider a list of all the medicines, herbs, non-prescription drugs, or dietary supplements you use. Also tell them if you smoke, drink alcohol, or use illegal drugs. Some items may interact with your medicine. What should I watch for while using this medication? Visit your doctor for checks on your progress. This drug may make you feel generally unwell. This is not uncommon, as chemotherapy can affect healthy cells as well as cancer cells. Report any side effects. Continue your course of treatment even though you feel ill unless your doctor tells you to stop. In some cases, you may be given additional medicines to help with side effects. Follow all directions for their use. Call your doctor or health care professional for  advice if you get a fever, chills or sore throat, or other symptoms of a cold or flu. Do not treat yourself. This drug decreases your body's ability to fight infections. Try to avoid being around people who are sick. This medicine may increase your risk to bruise or bleed. Call your doctor or health care professional if you notice any unusual bleeding. Be careful brushing and flossing your teeth or using a toothpick because you may get an infection or bleed more easily. If you have any dental work done, tell your dentist you are receiving this medicine. Avoid taking products that contain aspirin, acetaminophen, ibuprofen, naproxen, or ketoprofen unless instructed by your doctor. These medicines may hide a fever. Do not become pregnant while taking this medicine or for 6 months after stopping it. Women should inform their doctor if they wish to become pregnant or think they might be pregnant. Men should not father a child while taking this medicine and for 3 months after stopping it. There is a potential for serious side effects to an unborn child. Talk to your health care professional or pharmacist for more information. Do not breast-feed an infant while taking this medicine or for at least 1 week after stopping it. Men should inform their doctors if they wish to father a child. This medicine may lower sperm counts. Talk with your doctor or health care professional if you are concerned about your fertility.  What side effects may I notice from receiving this medication? Side effects that you should report to your doctor or health care professional as soon as possible: allergic reactions like skin rash, itching or hives, swelling of the face, lips, or tongue breathing problems pain, redness, or irritation at site where injected signs and symptoms of a dangerous change in heartbeat or heart rhythm like chest pain; dizziness; fast or irregular heartbeat; palpitations; feeling faint or lightheaded, falls;  breathing problems signs of decreased platelets or bleeding - bruising, pinpoint red spots on the skin, black, tarry stools, blood in the urine signs of decreased red blood cells - unusually weak or tired, feeling faint or lightheaded, falls signs of infection - fever or chills, cough, sore throat, pain or difficulty passing urine signs and symptoms of kidney injury like trouble passing urine or change in the amount of urine signs and symptoms of liver injury like dark yellow or brown urine; general ill feeling or flu-like symptoms; light-colored stools; loss of appetite; nausea; right upper belly pain; unusually weak or tired; yellowing of the eyes or skin swelling of ankles, feet, hands Side effects that usually do not require medical attention (report to your doctor or health care professional if they continue or are bothersome): constipation diarrhea hair loss loss of appetite nausea rash vomiting This list may not describe all possible side effects. Call your doctor for medical advice about side effects. You may report side effects to FDA at 1-800-FDA-1088. Where should I keep my medication? This drug is given in a hospital or clinic and will not be stored at home. NOTE: This sheet is a summary. It may not cover all possible information. If you have questions about this medicine, talk to your doctor, pharmacist, or health care provider.  2022 Elsevier/Gold Standard (2017-12-29 00:00:00)

## 2021-09-09 NOTE — Progress Notes (Signed)
BP 83/62 today. Labs reviewed with Dr Janese Banks, LFTs out of parameter, Creatinine out of parameter. Per Dr Janese Banks okay to proceed with treatment today. 1L NS bolus ordered

## 2021-09-12 ENCOUNTER — Encounter: Payer: Self-pay | Admitting: Oncology

## 2021-09-12 NOTE — Progress Notes (Signed)
Hematology/Oncology Consult note [facility]  [phone] [phone]  Patient Care Team: Dion Body, MD as PCP - General (Family Medicine) Clent Jacks, RN as Oncology Nurse Navigator   Name of the patient: Rhonda Day  [id number]  [birth year]   Date of visit: 09/12/21  Diagnosis- metastatic pancreatic cancer with liver metastases  Chief complaint/ Reason for visit- on treatment assessment prior to cycle 9-day 8 of gemcitabine chemotherapy  Heme/Onc history: Patient is a 80 year old female who underwent ultrasound abdomen in December 2021 which showed hypoechoic mass in the pancreatic tail measuring 2.1 x 1.8 x 4.5 cm which was not seen on prior CT scans on MRI exams a year ago.  This was followed by a CT abdomen and pelvis with contrast which showed ill-defined hypervascular lesion in segment 8 of liver measuring 1.6 x 1.4 cm concerning for metastases.  2 other lesions 1.7 x 1 cm and 1.5 x 0.8 cm were also noted.  Large mass in the body of pancreas measuring 5.6 x 2.2 x 2.9 cm.  The lesion causes obstruction of the pancreatic duct resulting in ductal dilatation throughout the tail of the pancreas.  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  Splenic vein appears chronically occluded.  Lesion also comes in contact with splenic artery which remains patent.  This was followed by MRI abdomen which again showed similar findings.  Multiple foci of restricted diffusion with hyperenhancement noted in the right hepatic lobe.  Prominent portocaval lymph node seen for local nodal metastases along with a 5.3 cm body and tail pancreatic mass   Case was discussed at tumor board and lab was liver biopsy 2 weeks after patient initial Covid test that was positive.  However at the day of the biopsy this lesion was in close association with other  vascular structures and given the potential higher risks liver biopsy was aborted and EUS was recommended.  She is currently on Cardizem.     Patient had a EUS at [place] By Dr. Mont Dutton. Pathology was consistent with adenocarcinoma. Patient had a PET scan on 12/11/2020 which showed a hypermetabolic lesion in the medial aspect of the liver with an SUV of 4.2 there is a subtle hypodensity on the comparison CT measuring 9 mm at this level. The hypermetabolic lesion in the dome of the liver corresponds to the enhancing lesion on the comparison MRI. 3.4 x 2.7 cm lesion in the mid body of pancreas with an SUV of 5.1. No hypermetabolic peripancreatic lymph nodes.    Patient received 1 cycle of modified FOLFIRINOX chemotherapy on 12/30/2020.  Following that patient had significant nausea vomiting fatigue and declining performance status requiring chemotherapy to be put on hold.  Patient is currently receiving gemcitabine Abraxane chemotherapy 2 weeks on and 1 week off    Interval history-patient reports improvement in her pain after she received radiation to her umbilical nodule.  She has ongoing fatigue.  Appetite is fair.  Reports occasional abdominal bloating.  ECOG PS- 2 Pain scale- 3 Opioid associated constipation- no  Review of systems- Review of Systems  Constitutional:  Positive for malaise/fatigue. Negative for chills, fever and weight loss.  HENT:  Negative for congestion, ear discharge and nosebleeds.   Eyes:  Negative for blurred vision.  Respiratory:  Negative for cough, hemoptysis, sputum production, shortness of breath and wheezing.   Cardiovascular:  Negative for chest  pain, palpitations, orthopnea and claudication.  Gastrointestinal:  Negative for abdominal pain, blood in stool, constipation, diarrhea, heartburn, melena, nausea and vomiting.       Abdominal bloating  Genitourinary:  Negative for dysuria, flank pain, frequency, hematuria and urgency.  Musculoskeletal:  Negative for back  pain, joint pain and myalgias.  Skin:  Negative for rash.  Neurological:  Negative for dizziness, tingling, focal weakness, seizures, weakness and headaches.  Endo/Heme/Allergies:  Does not bruise/bleed easily.  Psychiatric/Behavioral:  Negative for depression and suicidal ideas. The patient does not have insomnia.     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   B12 deficiency 09/30/2017   Bladder infection    Cancer associated pain 01/10/2021   Chronic allergic conjunctivitis 12/02/2020   Chronic cystitis 01/26/2017   Chronic pain syndrome 12/16/2017   Controlled type 2 diabetes mellitus with chronic kidney disease, without long-term current use of insulin ([place]) 01/09/2021   Controlled type 2 diabetes mellitus without complication ([place]) 03/21/6947   Convalescence following chemotherapy 01/10/2021   Degeneration of lumbar intervertebral disc 12/16/2017   Diabetes mellitus without complication ([place])    Dysuria 12/23/2016   Essential hypertension 11/15/2015   Family history of kidney cancer    Family history of kidney cancer    Family history of lung cancer    Family history of lung cancer    Family history of stomach cancer    Family history of stomach cancer    Family history of throat cancer    Family history of throat cancer    Female stress incontinence 11/11/2012   Genetic testing 01/28/2021   Negative genetic testing. No pathogenic variants identified on the [facility] Multi-Cancer+RNA panel. VUS in [facility] called c.1978G>A and VUS in [facility] called c.983C>T were identified. The report date its 01/25/2021.  The Multi-Cancer Panel + RNA offered by [facility] includes sequencing and/or deletion duplication  testing of the following 84 genes: AIP, ALK, APC, ATM, AXIN2,BAP1,  BARD1, BLM, BMPR1A, BRC   Goals of care, counseling/discussion 12/13/2020   History of atrial fibrillation 04/09/2016   History of chronic health problem 05/11/2016   Hyperlipidemia, mixed 12/09/2020   Hypertension    Incomplete bladder emptying 11/16/2012   Low back pain 11/11/2012   Low bladder compliance 11/11/2012   Lumbar post-laminectomy syndrome 12/16/2017   Medicare annual wellness visit, subsequent 09/29/2016   Menopause syndrome 12/24/2016   Other specified symptom associated with female genital organs 11/11/2012   Pancreatic cancer [facility])    with liver mass   Pancreatic cancer metastasized to liver ([place]) 12/13/2020   Pancreatic cancer metastasized to liver ([place]) 12/13/2020   Paroxysmal A-fib ([place]) 12/09/2020   Stage 3b chronic kidney disease ([place]) 03/23/2020   Symptoms involving urinary system 11/11/2012   Type 2 diabetes mellitus ([place]) 11/15/2015   Type 2 diabetes mellitus with diabetic chronic kidney disease ([place]) 03/23/2020   Vaginal spotting 05/11/2017   Vasomotor rhinitis 12/02/2020     Past Surgical History:  Procedure Laterality Date   BACK SURGERY     PORTA CATH INSERTION N/A 12/27/2020   Procedure: PORTA CATH INSERTION;  Surgeon: Algernon Huxley, MD;  Location: [place] [facility];  Service: Cardiovascular;  Laterality: N/A;  TUBAL LIGATION      Social History   Socioeconomic History   Marital status: Married    Spouse name: Not on file   Number of children: Not on file   Years of education: Not on file   Highest education level: Not on file  Occupational History   Not on file  Tobacco Use   Smoking status: Former    Types: Cigarettes    Quit date: 11/20/1979    Years since quitting: 41.8   Smokeless tobacco: Never  Vaping Use   Vaping Use: Never used  Substance and Sexual Activity   Alcohol use: No    Alcohol/week: 0.0 standard drinks   Drug use: No   Sexual activity: Never  Other Topics Concern    Not on file  Social History Narrative   Not on file   Social Determinants of Health   Financial Resource Strain: Not on file  Food Insecurity: Not on file  Transportation Needs: Not on file  Physical Activity: Not on file  Stress: Not on file  Social Connections: Not on file  Intimate Partner Violence: Not on file    Family History  Problem Relation Age of Onset   Kidney cancer Father 59   COPD Mother    Stomach cancer Maternal Aunt    Throat cancer Maternal Uncle    Liver cancer Paternal Uncle    Esophageal cancer Paternal Grandfather    Lung cancer Maternal Aunt    Lung cancer Maternal Uncle    Lung cancer Cousin    Kidney cancer Cousin 54   Testicular cancer Nephew        dx 20s   Breast cancer Neg Hx      Current Outpatient Medications:    azelastine (ASTELIN) 0.1 % nasal spray, Place into both nostrils daily as needed., Disp: , Rfl:    azelastine (OPTIVAR) 0.05 % ophthalmic solution, Place into both eyes daily as needed., Disp: , Rfl: 3   diltiazem (TIAZAC) 180 MG 24 hr capsule, Take by mouth daily., Disp: , Rfl:    gabapentin (NEURONTIN) 100 MG capsule, Take 1 capsule (100 mg total) by mouth at bedtime., Disp: 30 capsule, Rfl: 2   Insulin Glargine Solostar (LANTUS) 100 UNIT/ML Solostar Pen, Inject 10 Units into the skin at bedtime., Disp: , Rfl:    losartan (COZAAR) 100 MG tablet, Take 100 mg by mouth daily., Disp: , Rfl:    morphine (MS CONTIN) 15 MG 12 hr tablet, Take 1 tablet (15 mg total) by mouth every 12 (twelve) hours., Disp: 60 tablet, Rfl: 0   ONE TOUCH ULTRA TEST test strip, , Disp: , Rfl: 1   ONETOUCH DELICA LANCETS 81E MISC, 1 each by XX route as directed. Check CBG's fasting once daily. Dx: E11.9, Disp: , Rfl:    LORazepam (ATIVAN) 0.5 MG tablet, Take 1 tablet (0.5 mg total) by mouth every 8 (eight) hours as needed for anxiety., Disp: 60 tablet, Rfl: 0   losartan (COZAAR) 25 MG tablet, Take 1 tablet by mouth daily. (Patient not taking: No sig reported),  Disp: , Rfl:    Oxycodone HCl 10 MG TABS, Take 1 tablet (10 mg total) by mouth every 6 (six) hours as needed., Disp: 120 tablet, Rfl: 0   pantoprazole (PROTONIX) 20 MG tablet, Take 1 tablet (20 mg total) by mouth daily., Disp: 30 tablet, Rfl: 2   traZODone (DESYREL) 50 MG tablet, Take 0.5-1 tablets (25-50 mg total) by mouth at bedtime as needed for sleep.,  Disp: 30 tablet, Rfl: 3 No current facility-administered medications for this visit.  Facility-Administered Medications Ordered in Other Visits:    heparin lock flush 100 UNIT/ML injection, , , ,    sodium chloride flush (NS) 0.9 % injection 10 mL, 10 mL, Intravenous, PRN, Sindy Guadeloupe, MD, 10 mL at 03/04/21 1045  Physical exam:  Vitals:   09/02/21 1404  BP: 118/72  Pulse: 88  Resp: 16  Temp: 97.7 F (36.5 C)  TempSrc: Tympanic  Weight: 147 lb 14.4 oz (67.1 kg)  Height: '5\' 9"'  (1.753 m)   Physical Exam Constitutional:      General: She is not in acute distress. Cardiovascular:     Rate and Rhythm: Normal rate and regular rhythm.     Heart sounds: Normal heart sounds.  Pulmonary:     Effort: Pulmonary effort is normal.     Breath sounds: Normal breath sounds.  Abdominal:     General: Bowel sounds are normal.     Palpations: Abdomen is soft.     Comments: Palpable umbilical nodule at the 12 o'clock position somewhat decreased in size as compared to prior  Skin:    General: Skin is warm and dry.  Neurological:     Mental Status: She is alert and oriented to person, place, and time.     CMP Latest Ref Rng & Units 09/09/2021  Glucose 70 - 99 mg/dL 362(H)  BUN 8 - 23 mg/dL 30(H)  Creatinine 0.44 - 1.00 mg/dL 1.71(H)  Sodium 135 - 145 mmol/L 124(L)  Potassium 3.5 - 5.1 mmol/L 3.9  Chloride 98 - 111 mmol/L 92(L)  CO2 22 - 32 mmol/L 22  Calcium 8.9 - 10.3 mg/dL 8.6(L)  Total Protein 6.5 - 8.1 g/dL 7.6  Total Bilirubin 0.3 - 1.2 mg/dL 1.1  Alkaline Phos 38 - 126 U/L 550(H)  AST 15 - 41 U/L 114(H)  ALT 0 - 44 U/L 162(H)    CBC Latest Ref Rng & Units 09/09/2021  WBC 4.0 - 10.5 K/uL 5.0  Hemoglobin 12.0 - 15.0 g/dL 9.7(L)  Hematocrit 36.0 - 46.0 % 30.7(L)  Platelets 150 - 400 K/uL 230    Assessment and plan- Patient is a 80 y.o. female with history of metastatic pancreatic cancer with liver metastases.  She is here for on treatment assessment prior to cycle 9-day 8 of gemcitabine chemotherapy  CA 19-9 has increased exponentially from 642 3 months prior to 11,000 325 presently.  This is overall concerning for progressive disease.  Plan is to get a PET CT scan at this time.  She has  tolerated modified FOLFIRINOX chemotherapy poorly in the past.  Combination gemcitabine Abraxane chemotherapy led to worsening CKD as well as severe anemia and therefore Abraxane was dropped.  If scans show disease progression we could consider doing FOLFOX or FOLFIRI chemotherapy if she can tolerate it.  Proceeding with best supportive care or hospice is also a reasonable consideration at that time.  She will directly proceed for gemcitabine chemotherapy next week and I will see her back in 3 weeks to discuss PET scan results and possible gemcitabine chemotherapy  Neoplasm related pain: Continue as needed oxycodone.  I will increase the dose to 10 mg every 6 hours as needed instead of as needed hydrocodone.  She also has long-acting morphine pain medicine which she will now take as scheduled instead of as needed which she was doing previously    Visit Diagnosis 1. Pancreatic cancer metastasized to liver ([place])   2.  Goals of care, counseling/discussion   3. Encounter for antineoplastic chemotherapy   4. Neoplasm related pain      Dr. Randa Evens, MD, MPH [facility] [phone] 09/12/2021 11:44 AM

## 2021-09-16 ENCOUNTER — Ambulatory Visit
Admission: RE | Admit: 2021-09-16 | Discharge: 2021-09-16 | Disposition: A | Discharge status: Home / Self Care | Payer: Medicare PPO | Source: Ambulatory Visit | Attending: Oncology | Admitting: Oncology

## 2021-09-16 ENCOUNTER — Other Ambulatory Visit: Payer: Self-pay

## 2021-09-16 DIAGNOSIS — I700 Atherosclerosis of aorta: Secondary | ICD-10-CM | POA: Insufficient documentation

## 2021-09-16 DIAGNOSIS — I2721 Secondary pulmonary arterial hypertension: Secondary | ICD-10-CM | POA: Insufficient documentation

## 2021-09-16 DIAGNOSIS — C259 Malignant neoplasm of pancreas, unspecified: Secondary | ICD-10-CM | POA: Insufficient documentation

## 2021-09-16 DIAGNOSIS — E041 Nontoxic single thyroid nodule: Secondary | ICD-10-CM | POA: Insufficient documentation

## 2021-09-16 DIAGNOSIS — J90 Pleural effusion, not elsewhere classified: Secondary | ICD-10-CM | POA: Insufficient documentation

## 2021-09-16 DIAGNOSIS — C787 Secondary malignant neoplasm of liver and intrahepatic bile duct: Secondary | ICD-10-CM | POA: Insufficient documentation

## 2021-09-16 DIAGNOSIS — N200 Calculus of kidney: Secondary | ICD-10-CM | POA: Diagnosis not present

## 2021-09-16 LAB — GLUCOSE, CAPILLARY: Glucose-Capillary: 179 mg/dL — ABNORMAL HIGH (ref 70–99)

## 2021-09-16 IMAGING — CT NM PET TUM IMG RESTAG (PS) SKULL BASE T - THIGH
9 series · 24 of 25 positions shown · non-contrast
Comparison: CT chest abdomen pelvis [DATE] and PET [DATE].

CLINICAL DATA: Subsequent treatment strategy for pancreatic cancer.

EXAM:
NUCLEAR MEDICINE PET SKULL BASE TO THIGH
TECHNIQUE: 8.0 mCi F-18 FDG was injected intravenously. Full-ring PET imaging
was performed from the skull base to thigh after the radiotracer. CT
data was obtained and used for attenuation correction and anatomic
localization.
Fasting blood glucose: 179 mg/dl

[Series 3: ct wb 5.0 b30f · axial · 5.0mm · 0.98mm/px · z∈[-1266,-399]mm · 3 of 290 slices shown]
[im 1/290]
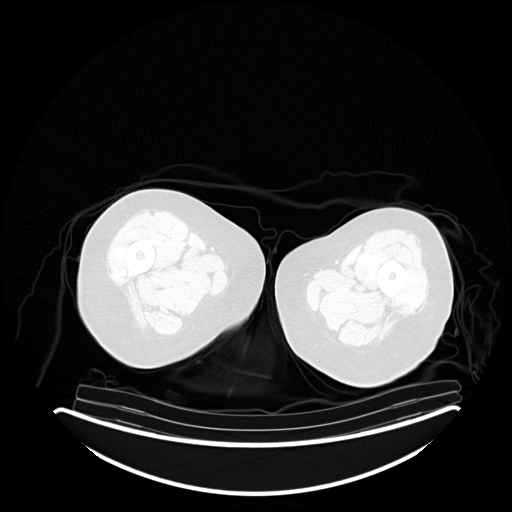
[im 145/290]
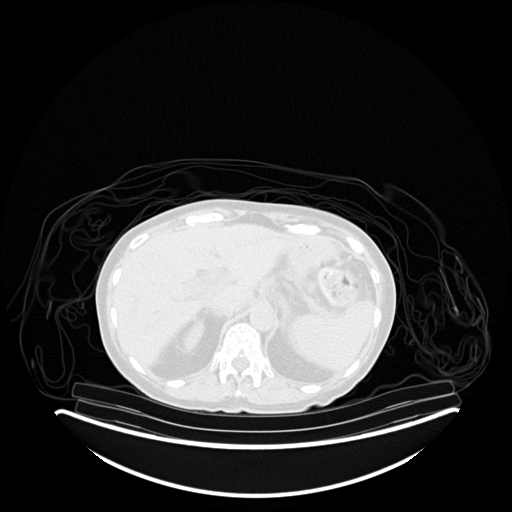
[im 290/290  brain]
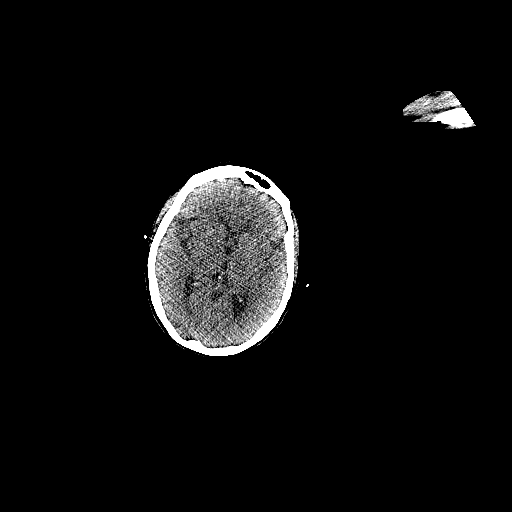

[Series 5: pet wb uncorrected (nac) · axial · 5.0mm · 4.07mm/px · z∈[-1266,-399]mm · 3 of 290 slices shown]
[im 1/290]
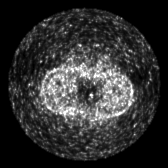
[im 145/290]
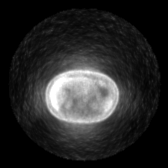
[im 290/290]
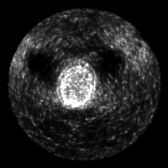

[Series 6: pet wb (ac) · axial · 5.0mm · 3.13mm/px · z∈[-1266,-399]mm · 4 of 290 slices shown]
[im 1/290]
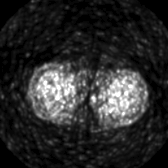
[im 97/290]
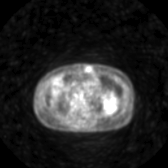
[im 193/290]
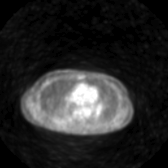
[im 290/290]
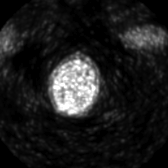

[Series 603: pet axial fused · 3 of 288 slices shown]
[im 1/288]
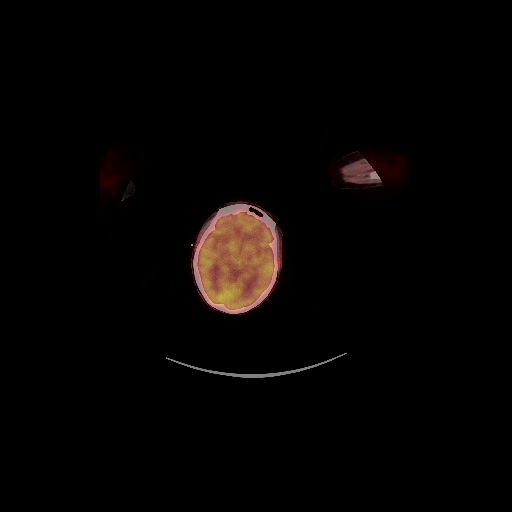
[im 96/288]
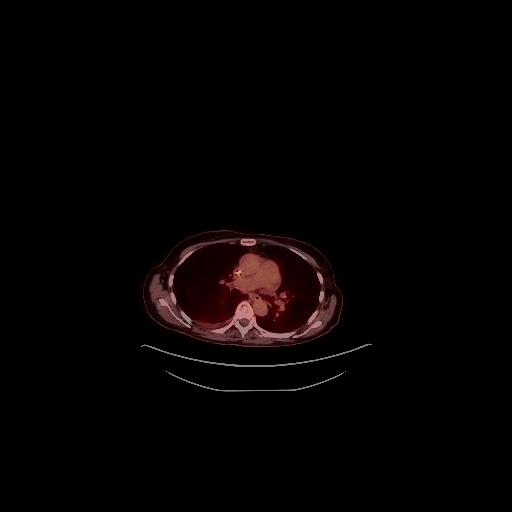
[im 288/288]
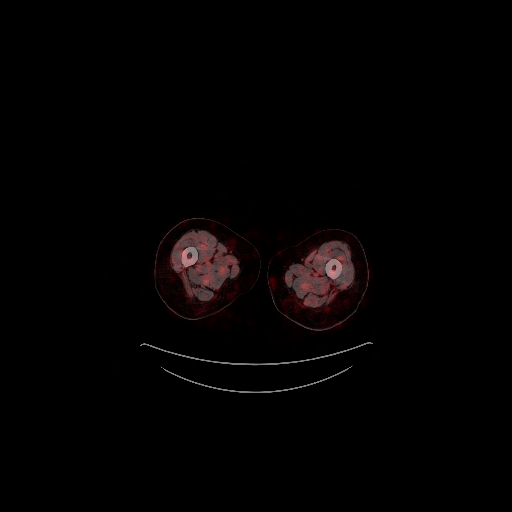

[Series 604: pet coronal fused · 1 of 104 slices shown]
[im 1/104]
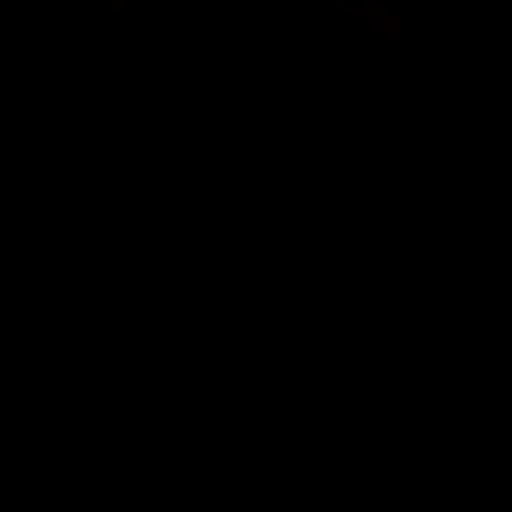

[Series 607: pet sagittal fused · 2 of 171 slices shown]
[im 1/171]
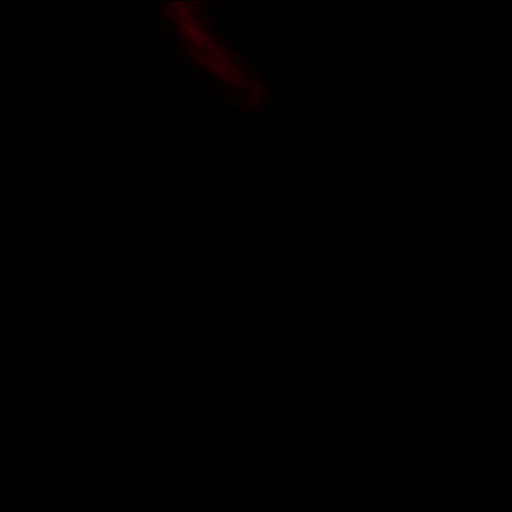
[im 171/171]
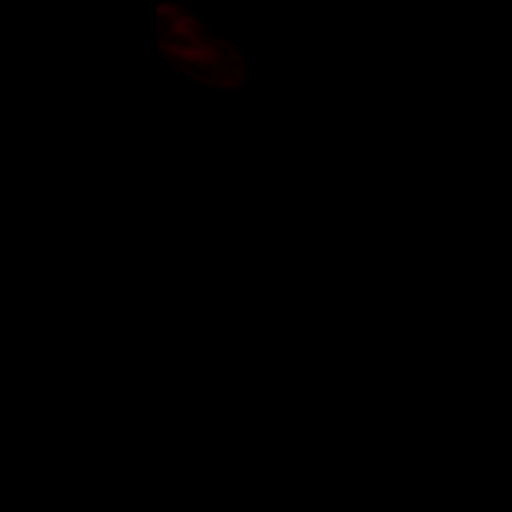

[Series 608: pet axial · 4 of 290 slices shown]
[im 1/290]
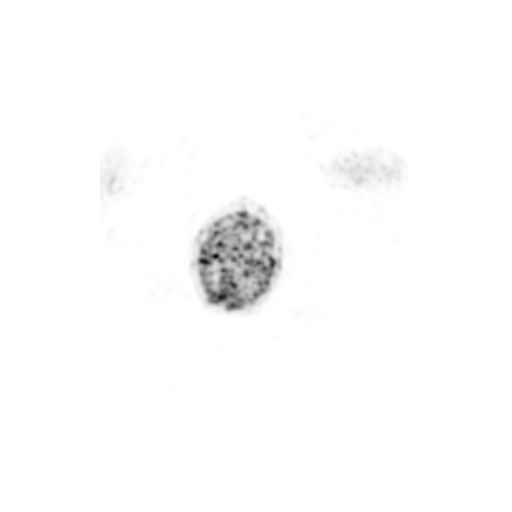
[im 97/290]
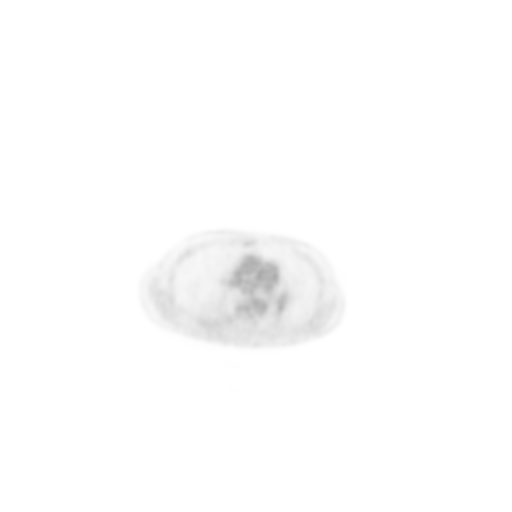
[im 193/290]
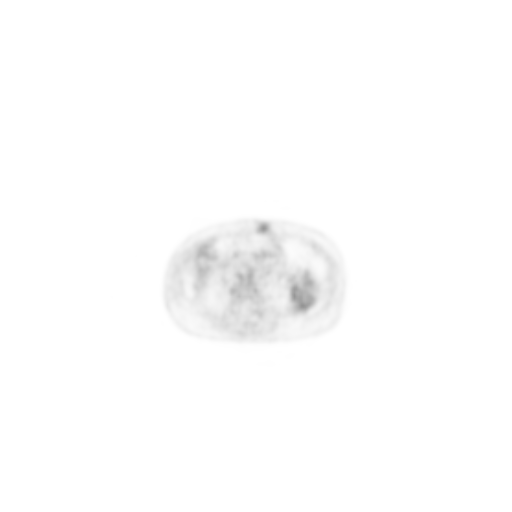
[im 290/290]
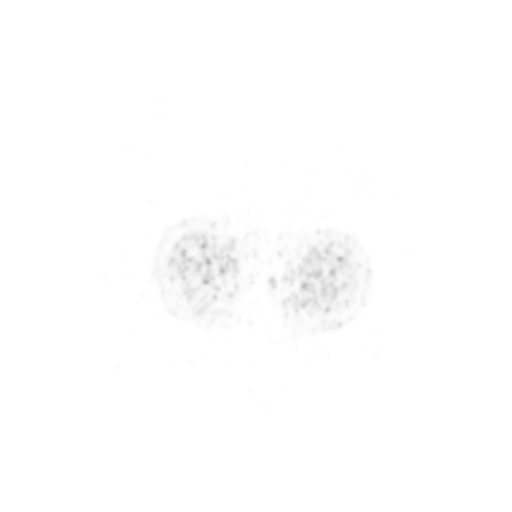

[Series 609: pet coronal · 2 of 127 slices shown]
[im 1/127]
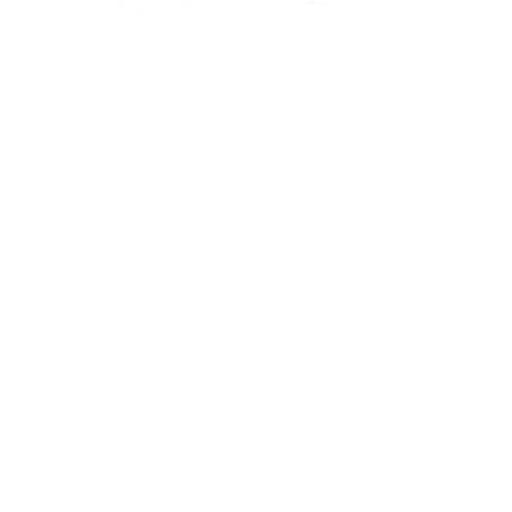
[im 127/127]
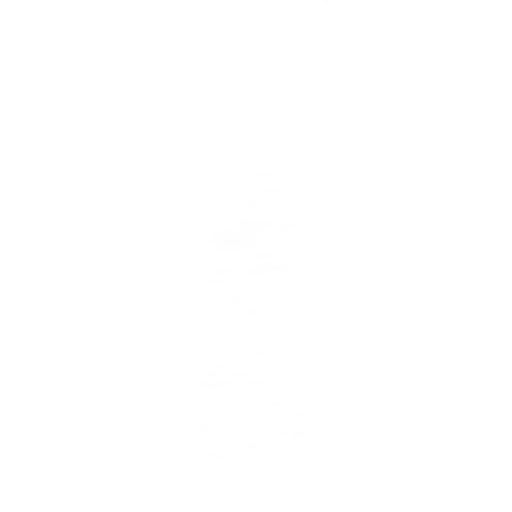

[Series 610: pet sagittal · 2 of 176 slices shown]
[im 1/176]
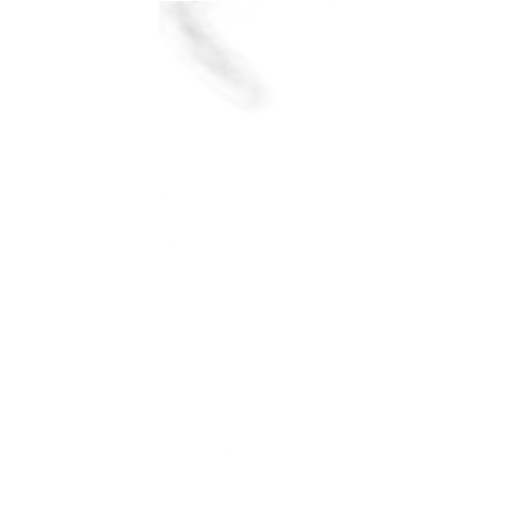
[im 176/176]
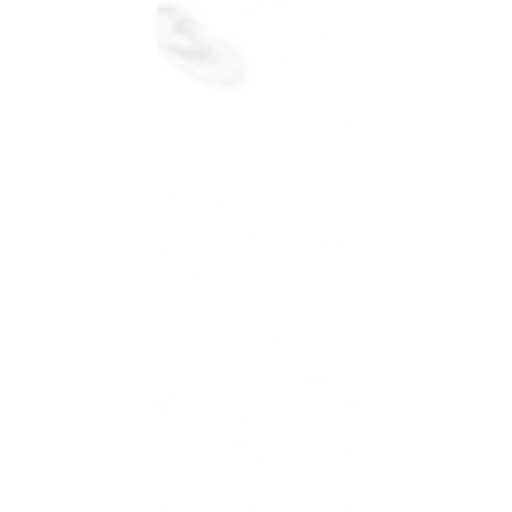

[24 of 25 positions shown; findings below may reference images not displayed]

FINDINGS: Mediastinal blood pool activity: SUV max

Liver activity: SUV max NA

NECK:

No abnormal hypermetabolism.

Incidental CT findings:

None.

CHEST:

No hypermetabolic mediastinal, hilar or axillary lymph nodes. No
hypermetabolic pulmonary nodules.

Incidental CT findings:

Right IJ Port-A-Cath terminates in the low SVC. Heterogeneous 2.6 cm
left thyroid nodule. Atherosclerotic calcification of the aorta.
Enlarged pulmonic trunk and heart. No pericardial or pleural
effusion. Trace right pleural effusion.

ABDOMEN/PELVIS:

Mild hypermetabolism in the dome of the liver, associated with a
x 2.3 cm low-attenuation lesion (3/114), SUV max 4.6. Lesion has
increased in size from 1.1 x 1.5 cm on [DATE]. Similar focal
mild hyperattenuation in the central liver, SUV max 4.3, without a
definite CT correlate. Pancreatic body mass measures approximately
3.8 x 4.0 cm (3/153), SUV max 4.9, generally stable in size from
[DATE]. No abnormal hypermetabolism in the adrenal glands or
spleen. No hypermetabolic lymph nodes. Subcutaneous soft tissue
nodule at the umbilicus measures 1.4 x 1.7 cm (3/193), stable in
size from [DATE], SUV max 3.6. Lesion is new from prior PET.

Incidental CT findings:

Similar biliary ductal dilatation. Trace perihepatic ascites.
Gallbladder, adrenal glands unremarkable. Subcentimeter
low-attenuation lesion in the right kidney, too small to
characterize. Left renal stones. Spleen, pancreas, stomach and bowel
are otherwise grossly unremarkable.

SKELETON:

No abnormal hypermetabolism.

Incidental CT findings:

Degenerative changes in the spine.
IMPRESSION: 1. Hypermetabolic pancreatic mass, similar to prior. Enlarging
hypermetabolic metastasis in the dome of the liver. Additional vague
hypermetabolism in the central liver without a definite CT
correlate. Recommend attention on follow-up.
2. Hypermetabolic subcutaneous umbilical nodule, stable in size from
[DATE] but new from [DATE], worrisome for a metastasis.
3. Trace right pleural effusion.  Trace perihepatic ascites.
4. Heterogeneous 2.6 cm left thyroid nodule. Recommend thyroid
ultrasound. (Ref: [HOSPITAL]. [DATE]): 143-50).
Aortic atherosclerosis ([AF]-[AF]).
5. Left renal stones.
6. Enlarged pulmonic trunk, indicative of pulmonary arterial
hypertension.

## 2021-09-16 MED ORDER — FLUDEOXYGLUCOSE F - 18 (FDG) INJECTION
7.9000 | Freq: Once | INTRAVENOUS | Status: AC | PRN
  Administered 2021-09-16: 8.04 via INTRAVENOUS

## 2021-09-18 ENCOUNTER — Other Ambulatory Visit: Payer: Self-pay | Admitting: *Deleted

## 2021-09-18 ENCOUNTER — Other Ambulatory Visit: Payer: Self-pay

## 2021-09-18 ENCOUNTER — Ambulatory Visit
Admission: RE | Admit: 2021-09-18 | Discharge: 2021-09-18 | Disposition: A | Discharge status: Home / Self Care | Payer: Medicare PPO | Source: Ambulatory Visit | Attending: Radiation Oncology | Admitting: Radiation Oncology

## 2021-09-18 VITALS — BP 122/51 | HR 81 | Temp 97.6°F | Wt 145.7 lb

## 2021-09-18 DIAGNOSIS — C259 Malignant neoplasm of pancreas, unspecified: Secondary | ICD-10-CM | POA: Insufficient documentation

## 2021-09-18 DIAGNOSIS — C787 Secondary malignant neoplasm of liver and intrahepatic bile duct: Secondary | ICD-10-CM

## 2021-09-18 DIAGNOSIS — Z923 Personal history of irradiation: Secondary | ICD-10-CM | POA: Insufficient documentation

## 2021-09-18 DIAGNOSIS — C792 Secondary malignant neoplasm of skin: Secondary | ICD-10-CM | POA: Diagnosis not present

## 2021-09-18 MED ORDER — PROCHLORPERAZINE MALEATE 10 MG PO TABS: 10.0000 mg | ORAL_TABLET | Freq: Four times a day (QID) | ORAL | 0 refills | Status: AC | PRN

## 2021-09-18 MED ORDER — ONDANSETRON HCL 4 MG PO TABS: 4.0000 mg | ORAL_TABLET | Freq: Three times a day (TID) | ORAL | 0 refills | Status: AC | PRN

## 2021-09-18 NOTE — Progress Notes (Signed)
Radiation Oncology Follow up Note  Name: Rhonda Day   Date:   09/18/2021 MRN:  [id number] DOB: 06-Nov-1940    This 80 y.o. female presents to the clinic today for 1 month follow-up start status post palliative radiation therapy to her umbilical area for metastatic pancreatic adenocarcinoma.  REFERRING PROVIDER: Dion Body, MD  HPI: Patient is a 80 year old female now at 1 month having completed palliative radiation therapy to her umbilical area for metastatic stage IV adenocarcinoma the pancreas.  Seen today in follow-up she has had an excellent palliative benefit.  Took a little longer than we expected although she is without complaint at this time thinks has had an excellent response..  She did have a PET CT scan yesterday showing hypermetabolic pancreatic mass similar to prior has an enlarging hypermetabolic met in the dome of the liver.  She is currently receiving gemcitabine therapy through medical oncology.  COMPLICATIONS OF TREATMENT: none  FOLLOW UP COMPLIANCE: keeps appointments   PHYSICAL EXAM:  BP (!) 122/51   Pulse 81   Temp 97.6 F (36.4 C) (Tympanic)   Wt 145 lb 11.2 oz (66.1 kg)   LMP  (LMP Unknown)   BMI 21.52 kg/m  Palpation the above umbilical area does not elicit pain.  No significant skin reaction is noted.  Well-developed well-nourished patient in NAD. HEENT reveals PERLA, EOMI, discs not visualized.  Oral cavity is clear. No oral mucosal lesions are identified. Neck is clear without evidence of cervical or supraclavicular adenopathy. Lungs are clear to A&P. Cardiac examination is essentially unremarkable with regular rate and rhythm without murmur rub or thrill. Abdomen is benign with no organomegaly or masses noted. Motor sensory and DTR levels are equal and symmetric in the upper and lower extremities. Cranial nerves II through XII are grossly intact. Proprioception is intact. No peripheral adenopathy or edema is identified. No motor or sensory levels are  noted. Crude visual fields are within normal range.  RADIOLOGY RESULTS: PET CT scan reviewed compatible with above-stated findings  PLAN: Present time patient is achieved excellent palliation of the umbilical pain from her metastatic disease.  I am turning follow-up care over to medical oncology.  I be happy to reevaluate the patient anytime should further palliative treatment be indicated.  Patient knows to call with any concerns.  I would like to take this opportunity to thank you for allowing me to participate in the care of your patient.Noreene Filbert, MD

## 2021-09-18 NOTE — Telephone Encounter (Signed)
Patient called reporting that she is having nausea and has nothing for it. She is asking if we can send in prescription for her Please

## 2021-09-22 ENCOUNTER — Inpatient Hospital Stay: Payer: Medicare PPO | Attending: Hospice and Palliative Medicine | Admitting: Hospice and Palliative Medicine

## 2021-09-22 DIAGNOSIS — Z515 Encounter for palliative care: Secondary | ICD-10-CM | POA: Diagnosis not present

## 2021-09-22 DIAGNOSIS — C259 Malignant neoplasm of pancreas, unspecified: Secondary | ICD-10-CM | POA: Diagnosis not present

## 2021-09-22 DIAGNOSIS — Z8051 Family history of malignant neoplasm of kidney: Secondary | ICD-10-CM | POA: Insufficient documentation

## 2021-09-22 DIAGNOSIS — I129 Hypertensive chronic kidney disease with stage 1 through stage 4 chronic kidney disease, or unspecified chronic kidney disease: Secondary | ICD-10-CM | POA: Insufficient documentation

## 2021-09-22 DIAGNOSIS — C787 Secondary malignant neoplasm of liver and intrahepatic bile duct: Secondary | ICD-10-CM | POA: Diagnosis not present

## 2021-09-22 DIAGNOSIS — N189 Chronic kidney disease, unspecified: Secondary | ICD-10-CM | POA: Insufficient documentation

## 2021-09-22 DIAGNOSIS — Z800 Family history of malignant neoplasm of digestive organs: Secondary | ICD-10-CM | POA: Insufficient documentation

## 2021-09-22 DIAGNOSIS — Z801 Family history of malignant neoplasm of trachea, bronchus and lung: Secondary | ICD-10-CM | POA: Insufficient documentation

## 2021-09-22 DIAGNOSIS — Z809 Family history of malignant neoplasm, unspecified: Secondary | ICD-10-CM | POA: Insufficient documentation

## 2021-09-22 DIAGNOSIS — Z87891 Personal history of nicotine dependence: Secondary | ICD-10-CM | POA: Insufficient documentation

## 2021-09-22 DIAGNOSIS — Z8043 Family history of malignant neoplasm of testis: Secondary | ICD-10-CM | POA: Insufficient documentation

## 2021-09-22 DIAGNOSIS — Z5111 Encounter for antineoplastic chemotherapy: Secondary | ICD-10-CM | POA: Insufficient documentation

## 2021-09-22 DIAGNOSIS — C252 Malignant neoplasm of tail of pancreas: Secondary | ICD-10-CM | POA: Insufficient documentation

## 2021-09-22 DIAGNOSIS — E1122 Type 2 diabetes mellitus with diabetic chronic kidney disease: Secondary | ICD-10-CM | POA: Insufficient documentation

## 2021-09-22 NOTE — Progress Notes (Signed)
Virtual Visit via Telephone Note  I connected with Rhonda Day on 09/22/21 at 11:30 AM EST by telephone and verified that I am speaking with the correct person using two identifiers.  Location: Patient: home Provider: clinic   I discussed the limitations, risks, security and privacy concerns of performing an evaluation and management service by telephone and the availability of in person appointments. I also discussed with the patient that there may be a patient responsible charge related to this service. The patient expressed understanding and agreed to proceed.   History of Present Illness: Rhonda Day is a 80 y.o. 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  Patient is referred to palliative care to help address goals and manage ongoing symptoms.   Observations/Objective: I spoke with patient by phone.  She reports that she is doing reasonably well without significant symptomatic burden.  She does report occasional abdominal bloating and intermittent diarrhea but says that this is mostly resolved.  Pain is stable.  PET scan on 09/17/2021 revealed enlarging hypermetabolic liver metastasis but stable pancreatic mass and hypermetabolic subcutaneous umbilical nodule.  Patient sees Dr. Janese Banks tomorrow to discuss PET scan results and options for further treatment.  Assessment and Plan: Stage IV pancreatic cancer -on systemic chemotherapy.  Symptomatically stable.  PET scan suggestive of enlarging liver metastasis.  Patient to see Dr. Janese Banks tomorrow  Neoplasm related pain -continue Norco as needed.   Follow Up Instructions: Follow-up MyChart/telephone visit 1 month   I discussed the assessment and treatment plan with the patient. The patient was provided an opportunity to ask questions and all were answered. The patient agreed with the plan and demonstrated an  understanding of the instructions.   The patient was advised to call back or seek an in-person evaluation if the symptoms worsen or if the condition fails to improve as anticipated.  I provided 12 minutes of non-face-to-face time during this encounter.   Irean Hong, NP

## 2021-09-23 ENCOUNTER — Inpatient Hospital Stay: Payer: Medicare PPO

## 2021-09-23 ENCOUNTER — Encounter: Payer: Self-pay | Admitting: Oncology

## 2021-09-23 ENCOUNTER — Other Ambulatory Visit: Payer: Self-pay

## 2021-09-23 ENCOUNTER — Inpatient Hospital Stay: Payer: Medicare PPO | Admitting: Oncology

## 2021-09-23 VITALS — BP 102/64 | HR 71 | Temp 96.6°F | Resp 16 | Wt 146.9 lb

## 2021-09-23 DIAGNOSIS — C787 Secondary malignant neoplasm of liver and intrahepatic bile duct: Secondary | ICD-10-CM

## 2021-09-23 DIAGNOSIS — Z7189 Other specified counseling: Secondary | ICD-10-CM | POA: Diagnosis not present

## 2021-09-23 DIAGNOSIS — C259 Malignant neoplasm of pancreas, unspecified: Secondary | ICD-10-CM

## 2021-09-23 DIAGNOSIS — Z5111 Encounter for antineoplastic chemotherapy: Secondary | ICD-10-CM

## 2021-09-23 DIAGNOSIS — Z801 Family history of malignant neoplasm of trachea, bronchus and lung: Secondary | ICD-10-CM | POA: Diagnosis not present

## 2021-09-23 DIAGNOSIS — E1122 Type 2 diabetes mellitus with diabetic chronic kidney disease: Secondary | ICD-10-CM | POA: Diagnosis not present

## 2021-09-23 DIAGNOSIS — Z87891 Personal history of nicotine dependence: Secondary | ICD-10-CM | POA: Diagnosis not present

## 2021-09-23 DIAGNOSIS — I129 Hypertensive chronic kidney disease with stage 1 through stage 4 chronic kidney disease, or unspecified chronic kidney disease: Secondary | ICD-10-CM | POA: Diagnosis not present

## 2021-09-23 DIAGNOSIS — Z8043 Family history of malignant neoplasm of testis: Secondary | ICD-10-CM | POA: Diagnosis not present

## 2021-09-23 DIAGNOSIS — N189 Chronic kidney disease, unspecified: Secondary | ICD-10-CM | POA: Diagnosis not present

## 2021-09-23 DIAGNOSIS — C252 Malignant neoplasm of tail of pancreas: Secondary | ICD-10-CM | POA: Diagnosis present

## 2021-09-23 DIAGNOSIS — Z8051 Family history of malignant neoplasm of kidney: Secondary | ICD-10-CM | POA: Diagnosis not present

## 2021-09-23 DIAGNOSIS — Z809 Family history of malignant neoplasm, unspecified: Secondary | ICD-10-CM | POA: Diagnosis not present

## 2021-09-23 DIAGNOSIS — Z800 Family history of malignant neoplasm of digestive organs: Secondary | ICD-10-CM | POA: Diagnosis not present

## 2021-09-23 LAB — CBC WITH DIFFERENTIAL/PLATELET
Abs Immature Granulocytes: 0.03 K/uL (ref 0.00–0.07)
Basophils Absolute: 0 K/uL (ref 0.0–0.1)
Basophils Relative: 0 %
Eosinophils Absolute: 0.2 K/uL (ref 0.0–0.5)
Eosinophils Relative: 2 %
HCT: 35.4 % — ABNORMAL LOW (ref 36.0–46.0)
Hemoglobin: 11.2 g/dL — ABNORMAL LOW (ref 12.0–15.0)
Immature Granulocytes: 0 %
Lymphocytes Relative: 15 %
Lymphs Abs: 1.4 K/uL (ref 0.7–4.0)
MCH: 31.1 pg (ref 26.0–34.0)
MCHC: 31.6 g/dL (ref 30.0–36.0)
MCV: 98.3 fL (ref 80.0–100.0)
Monocytes Absolute: 1 K/uL (ref 0.1–1.0)
Monocytes Relative: 10 %
Neutro Abs: 6.8 K/uL (ref 1.7–7.7)
Neutrophils Relative %: 73 %
Platelets: 361 K/uL (ref 150–400)
RBC: 3.6 MIL/uL — ABNORMAL LOW (ref 3.87–5.11)
RDW: 14.8 % (ref 11.5–15.5)
WBC: 9.4 K/uL (ref 4.0–10.5)
nRBC: 0 % (ref 0.0–0.2)

## 2021-09-23 LAB — COMPREHENSIVE METABOLIC PANEL WITH GFR
ALT: 56 U/L — ABNORMAL HIGH (ref 0–44)
AST: 51 U/L — ABNORMAL HIGH (ref 15–41)
Albumin: 3.7 g/dL (ref 3.5–5.0)
Alkaline Phosphatase: 467 U/L — ABNORMAL HIGH (ref 38–126)
Anion gap: 13 (ref 5–15)
BUN: 25 mg/dL — ABNORMAL HIGH (ref 8–23)
CO2: 26 mmol/L (ref 22–32)
Calcium: 9.9 mg/dL (ref 8.9–10.3)
Chloride: 98 mmol/L (ref 98–111)
Creatinine, Ser: 1.58 mg/dL — ABNORMAL HIGH (ref 0.44–1.00)
GFR, Estimated: 33 mL/min — ABNORMAL LOW
Glucose, Bld: 210 mg/dL — ABNORMAL HIGH (ref 70–99)
Potassium: 3.8 mmol/L (ref 3.5–5.1)
Sodium: 137 mmol/L (ref 135–145)
Total Bilirubin: 1.2 mg/dL (ref 0.3–1.2)
Total Protein: 7.4 g/dL (ref 6.5–8.1)

## 2021-09-23 MED ORDER — SODIUM CHLORIDE 0.9% FLUSH
10.0000 mL | Freq: Once | INTRAVENOUS | Status: AC
  Administered 2021-09-23: 10 mL via INTRAVENOUS
  Filled 2021-09-23: qty 10

## 2021-09-23 MED ORDER — HEPARIN SOD (PORK) LOCK FLUSH 100 UNIT/ML IV SOLN
500.0000 [IU] | Freq: Once | INTRAVENOUS | Status: AC
  Administered 2021-09-23: 500 [IU] via INTRAVENOUS
  Filled 2021-09-23: qty 5

## 2021-09-23 NOTE — Progress Notes (Signed)
Nutrition  RD was planning on seeing patient during treatment today but no treatment given.  Will follow-up as able  Rosalind Guido B. Zenia Resides, [place], [place] Registered Dietitian [phone] (mobile)

## 2021-09-23 NOTE — Progress Notes (Signed)
Hematology/Oncology Consult note [facility]  [phone] [phone]  Patient Care Team: Dion Body, MD as PCP - General (Family Medicine) Clent Jacks, RN as Oncology Nurse Navigator   Name of the patient: Rhonda Day  [id number]  Jun 04, 1941   Date of visit: 09/23/21  Diagnosis- metastatic pancreatic cancer with liver metastases  Chief complaint/ Reason for visit-discuss PET CT scan results and further management  Heme/Onc history: Patient is a 80 year old female who underwent ultrasound abdomen in December 2021 which showed hypoechoic mass in the pancreatic tail measuring 2.1 x 1.8 x 4.5 cm which was not seen on prior CT scans on MRI exams a year ago.  This was followed by a CT abdomen and pelvis with contrast which showed ill-defined hypervascular lesion in segment 8 of liver measuring 1.6 x 1.4 cm concerning for metastases.  2 other lesions 1.7 x 1 cm and 1.5 x 0.8 cm were also noted.  Large mass in the body of pancreas measuring 5.6 x 2.2 x 2.9 cm.  The lesion causes obstruction of the pancreatic duct resulting in ductal dilatation throughout the tail of the pancreas.  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  Splenic vein appears chronically occluded.  Lesion also comes in contact with splenic artery which remains patent.  This was followed by MRI abdomen which again showed similar findings.  Multiple foci of restricted diffusion with hyperenhancement noted in the right hepatic lobe.  Prominent portocaval lymph node seen for local nodal metastases along with a 5.3 cm body and tail pancreatic mass   Case was discussed at tumor board and lab was liver biopsy 2 weeks after patient initial Covid test that was positive.  However at the day of the biopsy this lesion was in close association with other vascular structures and given  the potential higher risks liver biopsy was aborted and EUS was recommended.  She is currently on Cardizem.     Patient had a EUS at [place] By Dr. Mont Dutton. Pathology was consistent with adenocarcinoma. Patient had a PET scan on 12/11/2020 which showed a hypermetabolic lesion in the medial aspect of the liver with an SUV of 4.2 there is a subtle hypodensity on the comparison CT measuring 9 mm at this level. The hypermetabolic lesion in the dome of the liver corresponds to the enhancing lesion on the comparison MRI. 3.4 x 2.7 cm lesion in the mid body of pancreas with an SUV of 5.1. No hypermetabolic peripancreatic lymph nodes.    Patient received 1 cycle of modified FOLFIRINOX chemotherapy on 12/30/2020.  Following that patient had significant nausea vomiting fatigue and declining performance status requiring chemotherapy to be put on hold.  Patient is currently receiving gemcitabine Abraxane chemotherapy 2 weeks on and 1 week off    Interval history-patient had a break from chemotherapy for about 3 weeks now.  Reports that she is overall doing better energy levels have improved.  Abdominal pain as well as pain over the umbilical nodule is currently well controlled.  ECOG PS- 2 Pain scale- 3 Opioid associated constipation- no  Review of systems- Review of Systems  Constitutional:  Positive for malaise/fatigue. Negative for chills, fever and weight loss.  HENT:  Negative for congestion, ear discharge and nosebleeds.   Eyes:  Negative for blurred vision.  Respiratory:  Negative for cough, hemoptysis, sputum production, shortness of breath and wheezing.  Cardiovascular:  Negative for chest pain, palpitations, orthopnea and claudication.  Gastrointestinal:  Negative for abdominal pain, blood in stool, constipation, diarrhea, heartburn, melena, nausea and vomiting.  Genitourinary:  Negative for dysuria, flank pain, frequency, hematuria and urgency.  Musculoskeletal:  Negative for back pain, joint pain  and myalgias.  Skin:  Negative for rash.  Neurological:  Negative for dizziness, tingling, focal weakness, seizures, weakness and headaches.  Endo/Heme/Allergies:  Does not bruise/bleed easily.  Psychiatric/Behavioral:  Negative for depression and suicidal ideas. The patient does not have insomnia.       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   B12 deficiency 09/30/2017   Bladder infection    Cancer associated pain 01/10/2021   Chronic allergic conjunctivitis 12/02/2020   Chronic cystitis 01/26/2017   Chronic pain syndrome 12/16/2017   Controlled type 2 diabetes mellitus with chronic kidney disease, without long-term current use of insulin ([place]) 01/09/2021   Controlled type 2 diabetes mellitus without complication ([place]) 03/24/2946   Convalescence following chemotherapy 01/10/2021   Degeneration of lumbar intervertebral disc 12/16/2017   Diabetes mellitus without complication ([place])    Dysuria 12/23/2016   Essential hypertension 11/15/2015   Family history of kidney cancer    Family history of kidney cancer    Family history of lung cancer    Family history of lung cancer    Family history of stomach cancer    Family history of stomach cancer    Family history of throat cancer    Family history of throat cancer    Female stress incontinence 11/11/2012   Genetic testing 01/28/2021   Negative genetic testing. No pathogenic variants identified on the [facility] Multi-Cancer+RNA panel. VUS in [facility] called c.1978G>A and VUS in [facility] called c.983C>T were identified. The report date its 01/25/2021.  The Multi-Cancer Panel + RNA offered by [facility] includes sequencing and/or deletion duplication testing of the  following 84 genes: AIP, ALK, APC, ATM, AXIN2,BAP1,  BARD1, BLM, BMPR1A, BRC   Goals of care, counseling/discussion 12/13/2020   History of atrial fibrillation 04/09/2016   History of chronic health problem 05/11/2016   Hyperlipidemia, mixed 12/09/2020   Hypertension    Incomplete bladder emptying 11/16/2012   Low back pain 11/11/2012   Low bladder compliance 11/11/2012   Lumbar post-laminectomy syndrome 12/16/2017   Medicare annual wellness visit, subsequent 09/29/2016   Menopause syndrome 12/24/2016   Other specified symptom associated with female genital organs 11/11/2012   Pancreatic cancer [facility])    with liver mass   Pancreatic cancer metastasized to liver ([place]) 12/13/2020   Pancreatic cancer metastasized to liver ([place]) 12/13/2020   Paroxysmal A-fib ([place]) 12/09/2020   Stage 3b chronic kidney disease ([place]) 03/23/2020   Symptoms involving urinary system 11/11/2012   Type 2 diabetes mellitus ([place]) 11/15/2015   Type 2 diabetes mellitus with diabetic chronic kidney disease ([place]) 03/23/2020   Vaginal spotting 05/11/2017   Vasomotor rhinitis 12/02/2020     Past Surgical History:  Procedure Laterality Date   BACK SURGERY     PORTA CATH INSERTION N/A 12/27/2020   Procedure: PORTA CATH INSERTION;  Surgeon: Algernon Huxley, MD;  Location: [facility];  Service: Cardiovascular;  Laterality: N/A;  TUBAL LIGATION      Social History   Socioeconomic History   Marital status: Married    Spouse name: Not on file   Number of children: Not on file   Years of education: Not on file   Highest education level: Not on file  Occupational History   Not on file  Tobacco Use   Smoking status: Former    Types: Cigarettes    Quit date: 11/20/1979    Years since quitting: 41.8   Smokeless tobacco: Never  Vaping Use   Vaping Use: Never used  Substance and Sexual Activity   Alcohol use: No    Alcohol/week: 0.0 standard drinks   Drug use: No   Sexual activity: Never  Other Topics Concern   Not on file   Social History Narrative   Not on file   Social Determinants of Health   Financial Resource Strain: Not on file  Food Insecurity: Not on file  Transportation Needs: Not on file  Physical Activity: Not on file  Stress: Not on file  Social Connections: Not on file  Intimate Partner Violence: Not on file    Family History  Problem Relation Age of Onset   Kidney cancer Father 92   COPD Mother    Stomach cancer Maternal Aunt    Throat cancer Maternal Uncle    Liver cancer Paternal Uncle    Esophageal cancer Paternal Grandfather    Lung cancer Maternal Aunt    Lung cancer Maternal Uncle    Lung cancer Cousin    Kidney cancer Cousin 52   Testicular cancer Nephew        dx 20s   Breast cancer Neg Hx      Current Outpatient Medications:    azelastine (ASTELIN) 0.1 % nasal spray, Place into both nostrils daily as needed., Disp: , Rfl:    azelastine (OPTIVAR) 0.05 % ophthalmic solution, Place into both eyes daily as needed., Disp: , Rfl: 3   diltiazem (TIAZAC) 180 MG 24 hr capsule, Take by mouth daily., Disp: , Rfl:    Insulin Glargine Solostar (LANTUS) 100 UNIT/ML Solostar Pen, Inject 10 Units into the skin at bedtime., Disp: , Rfl:    LORazepam (ATIVAN) 0.5 MG tablet, Take 1 tablet (0.5 mg total) by mouth every 8 (eight) hours as needed for anxiety., Disp: 60 tablet, Rfl: 0   losartan (COZAAR) 100 MG tablet, Take 100 mg by mouth daily., Disp: , Rfl:    ondansetron (ZOFRAN) 4 MG tablet, Take 1 tablet (4 mg total) by mouth every 8 (eight) hours as needed for nausea or vomiting., Disp: 20 tablet, Rfl: 0   ONE TOUCH ULTRA TEST test strip, , Disp: , Rfl: 1   ONETOUCH DELICA LANCETS 50D MISC, 1 each by XX route as directed. Check CBG's fasting once daily. Dx: E11.9, Disp: , Rfl:    Oxycodone HCl 10 MG TABS, Take 1 tablet (10 mg total) by mouth every 6 (six) hours as needed., Disp: 120 tablet, Rfl: 0   pantoprazole (PROTONIX) 20 MG tablet, Take 1 tablet (20 mg total) by mouth daily.,  Disp: 30 tablet, Rfl: 2   prochlorperazine (COMPAZINE) 10 MG tablet, Take 1 tablet (10 mg total) by mouth every 6 (six) hours as needed for nausea or vomiting., Disp: 30 tablet, Rfl: 0   traZODone (DESYREL) 50 MG tablet, Take 0.5-1 tablets (25-50 mg total) by mouth at bedtime as needed for sleep., Disp: 30 tablet, Rfl: 3   gabapentin (NEURONTIN) 100 MG capsule,  Take 1 capsule (100 mg total) by mouth at bedtime. (Patient not taking: Reported on 09/23/2021), Disp: 30 capsule, Rfl: 2   losartan (COZAAR) 25 MG tablet, Take 1 tablet by mouth daily. (Patient not taking: Reported on 07/11/2021), Disp: , Rfl:    morphine (MS CONTIN) 15 MG 12 hr tablet, Take 1 tablet (15 mg total) by mouth every 12 (twelve) hours. (Patient not taking: Reported on 09/23/2021), Disp: 60 tablet, Rfl: 0 No current facility-administered medications for this visit.  Facility-Administered Medications Ordered in Other Visits:    heparin lock flush 100 UNIT/ML injection, , , ,    sodium chloride flush (NS) 0.9 % injection 10 mL, 10 mL, Intravenous, PRN, Sindy Guadeloupe, MD, 10 mL at 03/04/21 1045  Physical exam:  Vitals:   09/23/21 1033  BP: 102/64  Pulse: 71  Resp: 16  Temp: (!) 96.6 F (35.9 C)  SpO2: 93%  Weight: 146 lb 14.4 oz (66.6 kg)   Physical Exam Constitutional:      General: She is not in acute distress. Cardiovascular:     Rate and Rhythm: Normal rate and regular rhythm.     Heart sounds: Normal heart sounds.  Pulmonary:     Effort: Pulmonary effort is normal.     Breath sounds: Normal breath sounds.  Abdominal:     General: Bowel sounds are normal.     Palpations: Abdomen is soft.  Skin:    General: Skin is warm and dry.  Neurological:     Mental Status: She is alert and oriented to person, place, and time.     CMP Latest Ref Rng & Units 09/23/2021  Glucose 70 - 99 mg/dL 210(H)  BUN 8 - 23 mg/dL 25(H)  Creatinine 0.44 - 1.00 mg/dL 1.58(H)  Sodium 135 - 145 mmol/L 137  Potassium 3.5 - 5.1 mmol/L  3.8  Chloride 98 - 111 mmol/L 98  CO2 22 - 32 mmol/L 26  Calcium 8.9 - 10.3 mg/dL 9.9  Total Protein 6.5 - 8.1 g/dL 7.4  Total Bilirubin 0.3 - 1.2 mg/dL 1.2  Alkaline Phos 38 - 126 U/L 467(H)  AST 15 - 41 U/L 51(H)  ALT 0 - 44 U/L 56(H)   CBC Latest Ref Rng & Units 09/23/2021  WBC 4.0 - 10.5 K/uL 9.4  Hemoglobin 12.0 - 15.0 g/dL 11.2(L)  Hematocrit 36.0 - 46.0 % 35.4(L)  Platelets 150 - 400 K/uL 361    No images are attached to the encounter.  NM PET Image Restag (PS) Skull Base To Thigh  Result Date: 09/17/2021 CLINICAL DATA:  Subsequent treatment strategy for pancreatic cancer. EXAM: NUCLEAR MEDICINE PET SKULL BASE TO THIGH TECHNIQUE: 8.0 mCi F-18 FDG was injected intravenously. Full-ring PET imaging was performed from the skull base to thigh after the radiotracer. CT data was obtained and used for attenuation correction and anatomic localization. Fasting blood glucose: 179 mg/dl COMPARISON:  CT chest abdomen pelvis 07/08/2021 and PET 04/01/2021. FINDINGS: Mediastinal blood pool activity: SUV max 2.7 Liver activity: SUV max NA NECK: No abnormal hypermetabolism. Incidental CT findings: None. CHEST: No hypermetabolic mediastinal, hilar or axillary lymph nodes. No hypermetabolic pulmonary nodules. Incidental CT findings: Right IJ Port-A-Cath terminates in the low SVC. Heterogeneous 2.6 cm left thyroid nodule. Atherosclerotic calcification of the aorta. Enlarged pulmonic trunk and heart. No pericardial or pleural effusion. Trace right pleural effusion. ABDOMEN/PELVIS: Mild hypermetabolism in the dome of the liver, associated with a 2.0 x 2.3 cm low-attenuation lesion (3/114), SUV max 4.6. Lesion has increased in  size from 1.1 x 1.5 cm on 07/08/2021. Similar focal mild hyperattenuation in the central liver, SUV max 4.3, without a definite CT correlate. Pancreatic body mass measures approximately 3.8 x 4.0 cm (3/153), SUV max 4.9, generally stable in size from 07/08/2021. No abnormal hypermetabolism  in the adrenal glands or spleen. No hypermetabolic lymph nodes. Subcutaneous soft tissue nodule at the umbilicus measures 1.4 x 1.7 cm (3/193), stable in size from 07/08/2021, SUV max 3.6. Lesion is new from prior PET. Incidental CT findings: Similar biliary ductal dilatation. Trace perihepatic ascites. Gallbladder, adrenal glands unremarkable. Subcentimeter low-attenuation lesion in the right kidney, too small to characterize. Left renal stones. Spleen, pancreas, stomach and bowel are otherwise grossly unremarkable. SKELETON: No abnormal hypermetabolism. Incidental CT findings: Degenerative changes in the spine. IMPRESSION: 1. Hypermetabolic pancreatic mass, similar to prior. Enlarging hypermetabolic metastasis in the dome of the liver. Additional vague hypermetabolism in the central liver without a definite CT correlate. Recommend attention on follow-up. 2. Hypermetabolic subcutaneous umbilical nodule, stable in size from 07/08/2021 but new from 04/01/2021, worrisome for a metastasis. 3. Trace right pleural effusion.  Trace perihepatic ascites. 4. Heterogeneous 2.6 cm left thyroid nodule. Recommend thyroid ultrasound. (Ref: J Am Coll Radiol. 2015 Feb;12(2): 143-50). Aortic atherosclerosis (ICD10-I70.0). 5. Left renal stones. 6. Enlarged pulmonic trunk, indicative of pulmonary arterial hypertension. Electronically Signed   By: Lorin Picket M.D.   On: 09/17/2021 15:38     Assessment and plan- Patient is a 80 y.o. female  with history of metastatic pancreatic cancer with liver metastases.  She is here to discuss PET CT scan results and further management  CA 19-9 last month was not done due to lab error.CA 19-9 from today is pending.  Overall her numbers have been going up exponentially from 651 5 months ago to 11,000 325 2 months ago.  Her most recent PET CT scan shows increase in the size of her primary liver lesion from a prior 1.1 x 1.5 cm to 2 to 2.3 cm.  There was another area of hypermetabolism noted  on the PET scan without a CT correlate.  Pancreatic body mass was stable.  Patient has a known soft tissue subcutaneous umbilical nodule which was new as compared to initial PET CT scan but stable as compared to September 2022.  There were no other findings of worsening metastatic disease.  Discussed with the patient that the PET CT scan does show some concern for disease progression but did not seeing significant new disease elsewhere.  However given that the CA 19-9 continues to go up exponentially along with increase in the size of the liver lesion we have 2 options moving forward  Going back on the gemcitabine Abraxane regimen but she did not tolerate this regimen well due to worsening anemia as well as CKD. Trying FOLFIRI chemotherapy.  She did receive modified FOLFIRINOX chemotherapy for cycle 1 which she tolerated poorly but I will not be adding oxaliplatin at this time.  I will also dose reduce irinotecan to 150 mg per metered square and eliminate 5-FU bolus altogether and only offer infusional 5-FU chemotherapy.  We will try to do this regimen every 2 weeks if she can tolerate it but if she does not tolerate it well we can give it every 3 weeks.  Discussed risks and benefits of this chemotherapy regimen including all but not limited to nausea, vomiting, low blood counts, risk of infections and hospitalization.  Risk of diarrhea associated with irinotecan.  Treatment will be given with  a palliative intent.  Patient understands and agrees to proceed with option 2.  Patient has not received any chemotherapy for 3 weeks now and her anemia as well as CKD appear improved.  LFTs are also improved.  She will start FOLFIRI chemotherapy next week.  We will use labs from today's for next week.  Patient will be seen by NP Vonna Kotyk [employer] 3 weeks from now for cycle 2 of FOLFIRI chemotherapy and day 1 and based on her white count we can decide if she would need Udenyca on day 3 or not which has already been signed  for.  Discussed with the patient that gemcitabine Abraxane and 5-FU based chemotherapy are the mainstays of systemic treatment for pancreatic cancer.  If she does not tolerate this regimen well or has disease progression on both these regimens we are looking at best supportive care/hospice.  Presently patient feels well and wishes to continue chemotherapy at this time.  Abnormal LFTs: Possibly secondary to chemotherapy.  Other than her existing liver lesion that was larger there was no other concerning findings noted on PET/CT scan.  Continue to monitor  Neoplasm related pain: Continue as needed oxycodone.  She is not using her long-acting morphine.   Visit Diagnosis 1. Pancreatic cancer metastasized to liver ([place])   2. Encounter for antineoplastic chemotherapy   3. Goals of care, counseling/discussion      Dr. Randa Evens, MD, MPH [facility] at [facility] [phone] 09/23/2021 1:13 PM

## 2021-09-23 NOTE — Progress Notes (Signed)
Pt's questions for the visit:  Results of PET? Lesions on her liver, seems to be a new one and how can it be treated?  Bloating from last week; very uncomfortable. Believes it was from a salad.  Is she able to drive far [facility]) does fine around town but will like to know if she is able to drive to [place] and [place].  Believes she is dehydrated due to the diarrhea she experienced last week.

## 2021-09-24 LAB — CANCER ANTIGEN 19-9: CA 19-9: 12679 U/mL — ABNORMAL HIGH (ref 0–35)

## 2021-09-25 ENCOUNTER — Other Ambulatory Visit: Payer: Self-pay | Admitting: Oncology

## 2021-09-30 ENCOUNTER — Inpatient Hospital Stay: Payer: Medicare PPO

## 2021-09-30 ENCOUNTER — Other Ambulatory Visit: Payer: Self-pay

## 2021-09-30 DIAGNOSIS — C259 Malignant neoplasm of pancreas, unspecified: Secondary | ICD-10-CM

## 2021-09-30 DIAGNOSIS — Z5111 Encounter for antineoplastic chemotherapy: Secondary | ICD-10-CM | POA: Diagnosis not present

## 2021-09-30 DIAGNOSIS — C787 Secondary malignant neoplasm of liver and intrahepatic bile duct: Secondary | ICD-10-CM

## 2021-09-30 LAB — CBC WITH DIFFERENTIAL/PLATELET
Abs Immature Granulocytes: 0.02 10*3/uL (ref 0.00–0.07)
Basophils Absolute: 0 10*3/uL (ref 0.0–0.1)
Basophils Relative: 0 %
Eosinophils Absolute: 0.1 10*3/uL (ref 0.0–0.5)
Eosinophils Relative: 2 %
HCT: 33.5 % — ABNORMAL LOW (ref 36.0–46.0)
Hemoglobin: 10.8 g/dL — ABNORMAL LOW (ref 12.0–15.0)
Immature Granulocytes: 0 %
Lymphocytes Relative: 13 %
Lymphs Abs: 1 10*3/uL (ref 0.7–4.0)
MCH: 31.2 pg (ref 26.0–34.0)
MCHC: 32.2 g/dL (ref 30.0–36.0)
MCV: 96.8 fL (ref 80.0–100.0)
Monocytes Absolute: 0.9 10*3/uL (ref 0.1–1.0)
Monocytes Relative: 12 %
Neutro Abs: 5.4 10*3/uL (ref 1.7–7.7)
Neutrophils Relative %: 73 %
Platelets: 350 10*3/uL (ref 150–400)
RBC: 3.46 MIL/uL — ABNORMAL LOW (ref 3.87–5.11)
RDW: 14.7 % (ref 11.5–15.5)
Smear Review: NORMAL
WBC: 7.4 10*3/uL (ref 4.0–10.5)
nRBC: 0 % (ref 0.0–0.2)

## 2021-09-30 LAB — COMPREHENSIVE METABOLIC PANEL
ALT: 153 U/L — ABNORMAL HIGH (ref 0–44)
AST: 174 U/L — ABNORMAL HIGH (ref 15–41)
Albumin: 3.6 g/dL (ref 3.5–5.0)
Alkaline Phosphatase: 496 U/L — ABNORMAL HIGH (ref 38–126)
Anion gap: 12 (ref 5–15)
BUN: 28 mg/dL — ABNORMAL HIGH (ref 8–23)
CO2: 24 mmol/L (ref 22–32)
Calcium: 9.5 mg/dL (ref 8.9–10.3)
Chloride: 97 mmol/L — ABNORMAL LOW (ref 98–111)
Creatinine, Ser: 1.69 mg/dL — ABNORMAL HIGH (ref 0.44–1.00)
GFR, Estimated: 31 mL/min — ABNORMAL LOW (ref >60)
Glucose, Bld: 296 mg/dL — ABNORMAL HIGH (ref 70–99)
Potassium: 3.9 mmol/L (ref 3.5–5.1)
Sodium: 133 mmol/L — ABNORMAL LOW (ref 135–145)
Total Bilirubin: 2.5 mg/dL — ABNORMAL HIGH (ref 0.3–1.2)
Total Protein: 7.3 g/dL (ref 6.5–8.1)

## 2021-09-30 MED ORDER — SODIUM CHLORIDE 0.9 % IV SOLN
2400.0000 mg/m2 | INTRAVENOUS | Status: DC
  Administered 2021-09-30: 4300 mg via INTRAVENOUS
  Filled 2021-09-30: qty 86

## 2021-09-30 MED ORDER — SODIUM CHLORIDE 0.9 % IV SOLN
135.0000 mg/m2 | Freq: Once | INTRAVENOUS | Status: AC
  Administered 2021-09-30: 240 mg via INTRAVENOUS
  Filled 2021-09-30: qty 10

## 2021-09-30 MED ORDER — ATROPINE SULFATE 1 MG/ML IV SOLN
0.5000 mg | Freq: Once | INTRAVENOUS | Status: AC | PRN
  Administered 2021-09-30: 0.5 mg via INTRAVENOUS
  Filled 2021-09-30: qty 1

## 2021-09-30 MED ORDER — SODIUM CHLORIDE 0.9 % IV SOLN
Freq: Once | INTRAVENOUS | Status: AC
  Filled 2021-09-30: qty 250

## 2021-09-30 MED ORDER — SODIUM CHLORIDE 0.9 % IV SOLN
700.0000 mg | Freq: Once | INTRAVENOUS | Status: AC
  Administered 2021-09-30: 700 mg via INTRAVENOUS
  Filled 2021-09-30: qty 35

## 2021-09-30 MED ORDER — PALONOSETRON HCL INJECTION 0.25 MG/5ML
0.2500 mg | Freq: Once | INTRAVENOUS | Status: AC
  Administered 2021-09-30: 0.25 mg via INTRAVENOUS
  Filled 2021-09-30: qty 5

## 2021-09-30 MED ORDER — SODIUM CHLORIDE 0.9 % IV SOLN
10.0000 mg | Freq: Once | INTRAVENOUS | Status: AC
  Administered 2021-09-30: 10 mg via INTRAVENOUS
  Filled 2021-09-30: qty 10

## 2021-09-30 NOTE — Patient Instructions (Signed)
[facility] [place]  Discharge Instructions: Thank you for choosing [facility] to provide your oncology and hematology care.  If you have a lab appointment with the [facility], please go directly to the [facility] and check in at the registration area.  Wear comfortable clothing and clothing appropriate for easy access to any Portacath or PICC line.   We strive to give you quality time with your provider. You may need to reschedule your appointment if you arrive late (15 or more minutes).  Arriving late affects you and other patients whose appointments are after yours.  Also, if you miss three or more appointments without notifying the office, you may be dismissed from the clinic at the providers discretion.      For prescription refill requests, have your pharmacy contact our office and allow 72 hours for refills to be completed.    Today you received the following chemotherapy and/or immunotherapy agents : Leucovorin, Irinotecan, 5FU   To help prevent nausea and vomiting after your treatment, we encourage you to take your nausea medication as directed.  BELOW ARE SYMPTOMS THAT SHOULD BE REPORTED IMMEDIATELY: *FEVER GREATER THAN 100.4 F (38 C) OR HIGHER *CHILLS OR SWEATING *NAUSEA AND VOMITING THAT IS NOT CONTROLLED WITH YOUR NAUSEA MEDICATION *UNUSUAL SHORTNESS OF BREATH *UNUSUAL BRUISING OR BLEEDING *URINARY PROBLEMS (pain or burning when urinating, or frequent urination) *BOWEL PROBLEMS (unusual diarrhea, constipation, pain near the anus) TENDERNESS IN MOUTH AND THROAT WITH OR WITHOUT PRESENCE OF ULCERS (sore throat, sores in mouth, or a toothache) UNUSUAL RASH, SWELLING OR PAIN  UNUSUAL VAGINAL DISCHARGE OR ITCHING   Items with * indicate a potential emergency and should be followed up as soon as possible or go to the Emergency Department if any problems should occur.  Please show the CHEMOTHERAPY ALERT CARD or IMMUNOTHERAPY ALERT CARD  at check-in to the Emergency Department and triage nurse.  Should you have questions after your visit or need to cancel or reschedule your appointment, please contact [facility] [facility] [place]  [phone] and follow the prompts.  Office hours are 8:00 a.m. to 4:30 p.m. Monday - Friday. Please note that voicemails left after 4:00 p.m. may not be returned until the following business day.  We are closed weekends and major holidays. You have access to a nurse at all times for urgent questions. Please call the main number to the clinic [phone] and follow the prompts.  For any non-urgent questions, you may also contact your provider using MyChart. We now offer e-Visits for anyone 68 and older to request care online for non-urgent symptoms. For details visit [email or web address].   Also download the MyChart app! Go to the app store, search "MyChart", open the app, select [facility], and log in with your MyChart username and password.  Due to Covid, a mask is required upon entering the hospital/clinic. If you do not have a mask, one will be given to you upon arrival. For doctor visits, patients may have 1 support person aged 33 or older with them. For treatment visits, patients cannot have anyone with them due to current Covid guidelines and our immunocompromised population.  Irinotecan injection What is this medication? IRINOTECAN (ir in oh TEE kan ) is a chemotherapy drug. It is used to treat colon and rectal cancer. This medicine may be used for other purposes; ask your health care provider or pharmacist if you have questions. COMMON BRAND NAME(S): Camptosar What should I tell my care team  before I take this medication? They need to know if you have any of these conditions: dehydration diarrhea infection (especially a virus infection such as chickenpox, cold sores, or herpes) liver disease low blood counts, like low white cell, platelet, or red cell counts low levels of  calcium, magnesium, or potassium in the blood recent or ongoing radiation therapy an unusual or allergic reaction to irinotecan, other medicines, foods, dyes, or preservatives pregnant or trying to get pregnant breast-feeding How should I use this medication? This drug is given as an infusion into a vein. It is administered in a hospital or clinic by a specially trained health care professional. Talk to your pediatrician regarding the use of this medicine in children. Special care may be needed. Overdosage: If you think you have taken too much of this medicine contact a poison control center or emergency room at once. NOTE: This medicine is only for you. Do not share this medicine with others. What if I miss a dose? It is important not to miss your dose. Call your doctor or health care professional if you are unable to keep an appointment. What may interact with this medication? Do not take this medicine with any of the following medications: cobicistat itraconazole This medicine may interact with the following medications: antiviral medicines for HIV or AIDS certain antibiotics like rifampin or rifabutin certain medicines for fungal infections like ketoconazole, posaconazole, and voriconazole certain medicines for seizures like carbamazepine, phenobarbital, phenotoin clarithromycin gemfibrozil nefazodone [facility]. John's Wort This list may not describe all possible interactions. Give your health care provider a list of all the medicines, herbs, non-prescription drugs, or dietary supplements you use. Also tell them if you smoke, drink alcohol, or use illegal drugs. Some items may interact with your medicine. What should I watch for while using this medication? Your condition will be monitored carefully while you are receiving this medicine. You will need important blood work done while you are taking this medicine. This drug may make you feel generally unwell. This is not uncommon, as  chemotherapy can affect healthy cells as well as cancer cells. Report any side effects. Continue your course of treatment even though you feel ill unless your doctor tells you to stop. In some cases, you may be given additional medicines to help with side effects. Follow all directions for their use. You may get drowsy or dizzy. Do not drive, use machinery, or do anything that needs mental alertness until you know how this medicine affects you. Do not stand or sit up quickly, especially if you are an older patient. This reduces the risk of dizzy or fainting spells. Call your health care professional for advice if you get a fever, chills, or sore throat, or other symptoms of a cold or flu. Do not treat yourself. This medicine decreases your body's ability to fight infections. Try to avoid being around people who are sick. Avoid taking products that contain aspirin, acetaminophen, ibuprofen, naproxen, or ketoprofen unless instructed by your doctor. These medicines may hide a fever. This medicine may increase your risk to bruise or bleed. Call your doctor or health care professional if you notice any unusual bleeding. Be careful brushing and flossing your teeth or using a toothpick because you may get an infection or bleed more easily. If you have any dental work done, tell your dentist you are receiving this medicine. Do not become pregnant while taking this medicine or for 6 months after stopping it. Women should inform their health care professional if  they wish to become pregnant or think they might be pregnant. Men should not father a child while taking this medicine and for 3 months after stopping it. There is potential for serious side effects to an unborn child. Talk to your health care professional for more information. Do not breast-feed an infant while taking this medicine or for 7 days after stopping it. This medicine has caused ovarian failure in some women. This medicine may make it more  difficult to get pregnant. Talk to your health care professional if you are concerned about your fertility. This medicine has caused decreased sperm counts in some men. This may make it more difficult to father a child. Talk to your health care professional if you are concerned about your fertility. What side effects may I notice from receiving this medication? Side effects that you should report to your doctor or health care professional as soon as possible: allergic reactions like skin rash, itching or hives, swelling of the face, lips, or tongue chest pain diarrhea flushing, runny nose, sweating during infusion low blood counts - this medicine may decrease the number of white blood cells, red blood cells and platelets. You may be at increased risk for infections and bleeding. nausea, vomiting pain, swelling, warmth in the leg signs of decreased platelets or bleeding - bruising, pinpoint red spots on the skin, black, tarry stools, blood in the urine signs of infection - fever or chills, cough, sore throat, pain or difficulty passing urine signs of decreased red blood cells - unusually weak or tired, fainting spells, lightheadedness Side effects that usually do not require medical attention (report to your doctor or health care professional if they continue or are bothersome): constipation hair loss headache loss of appetite mouth sores stomach pain This list may not describe all possible side effects. Call your doctor for medical advice about side effects. You may report side effects to FDA at 1-800-FDA-1088. Where should I keep my medication? This drug is given in a hospital or clinic and will not be stored at home. NOTE: This sheet is a summary. It may not cover all possible information. If you have questions about this medicine, talk to your doctor, pharmacist, or health care provider.  2022 Elsevier/Gold Standard (2021-06-24 00:00:00) Leucovorin injection What is this  medication? LEUCOVORIN (loo koe VOR in) is used to prevent or treat the harmful effects of some medicines. This medicine is used to treat anemia caused by a low amount of folic acid in the body. It is also used with 5-fluorouracil (5-FU) to treat colon cancer. This medicine may be used for other purposes; ask your health care provider or pharmacist if you have questions. What should I tell my care team before I take this medication? They need to know if you have any of these conditions: anemia from low levels of vitamin B-12 in the blood an unusual or allergic reaction to leucovorin, folic acid, other medicines, foods, dyes, or preservatives pregnant or trying to get pregnant breast-feeding How should I use this medication? This medicine is for injection into a muscle or into a vein. It is given by a health care professional in a hospital or clinic setting. Talk to your pediatrician regarding the use of this medicine in children. Special care may be needed. Overdosage: If you think you have taken too much of this medicine contact a poison control center or emergency room at once. NOTE: This medicine is only for you. Do not share this medicine with others. What if  I miss a dose? This does not apply. What may interact with this medication? capecitabine fluorouracil phenobarbital phenytoin primidone trimethoprim-sulfamethoxazole This list may not describe all possible interactions. Give your health care provider a list of all the medicines, herbs, non-prescription drugs, or dietary supplements you use. Also tell them if you smoke, drink alcohol, or use illegal drugs. Some items may interact with your medicine. What should I watch for while using this medication? Your condition will be monitored carefully while you are receiving this medicine. This medicine may increase the side effects of 5-fluorouracil, 5-FU. Tell your doctor or health care professional if you have diarrhea or mouth sores  that do not get better or that get worse. What side effects may I notice from receiving this medication? Side effects that you should report to your doctor or health care professional as soon as possible: allergic reactions like skin rash, itching or hives, swelling of the face, lips, or tongue breathing problems fever, infection mouth sores unusual bleeding or bruising unusually weak or tired Side effects that usually do not require medical attention (report to your doctor or health care professional if they continue or are bothersome): constipation or diarrhea loss of appetite nausea, vomiting This list may not describe all possible side effects. Call your doctor for medical advice about side effects. You may report side effects to FDA at 1-800-FDA-1088. Where should I keep my medication? This drug is given in a hospital or clinic and will not be stored at home. NOTE: This sheet is a summary. It may not cover all possible information. If you have questions about this medicine, talk to your doctor, pharmacist, or health care provider.  2022 Elsevier/Gold Standard (2008-04-12 00:00:00) Fluorouracil, 5-FU injection What is this medication? FLUOROURACIL, 5-FU (flure oh YOOR a sil) is a chemotherapy drug. It slows the growth of cancer cells. This medicine is used to treat many types of cancer like breast cancer, colon or rectal cancer, pancreatic cancer, and stomach cancer. This medicine may be used for other purposes; ask your health care provider or pharmacist if you have questions. COMMON BRAND NAME(S): Adrucil What should I tell my care team before I take this medication? They need to know if you have any of these conditions: blood disorders dihydropyrimidine dehydrogenase (DPD) deficiency infection (especially a virus infection such as chickenpox, cold sores, or herpes) kidney disease liver disease malnourished, poor nutrition recent or ongoing radiation therapy an unusual or  allergic reaction to fluorouracil, other chemotherapy, other medicines, foods, dyes, or preservatives pregnant or trying to get pregnant breast-feeding How should I use this medication? This drug is given as an infusion or injection into a vein. It is administered in a hospital or clinic by a specially trained health care professional. Talk to your pediatrician regarding the use of this medicine in children. Special care may be needed. Overdosage: If you think you have taken too much of this medicine contact a poison control center or emergency room at once. NOTE: This medicine is only for you. Do not share this medicine with others. What if I miss a dose? It is important not to miss your dose. Call your doctor or health care professional if you are unable to keep an appointment. What may interact with this medication? Do not take this medicine with any of the following medications: live virus vaccines This medicine may also interact with the following medications: medicines that treat or prevent blood clots like warfarin, enoxaparin, and dalteparin This list may not describe all  possible interactions. Give your health care provider a list of all the medicines, herbs, non-prescription drugs, or dietary supplements you use. Also tell them if you smoke, drink alcohol, or use illegal drugs. Some items may interact with your medicine. What should I watch for while using this medication? Visit your doctor for checks on your progress. This drug may make you feel generally unwell. This is not uncommon, as chemotherapy can affect healthy cells as well as cancer cells. Report any side effects. Continue your course of treatment even though you feel ill unless your doctor tells you to stop. In some cases, you may be given additional medicines to help with side effects. Follow all directions for their use. Call your doctor or health care professional for advice if you get a fever, chills or sore throat, or  other symptoms of a cold or flu. Do not treat yourself. This drug decreases your body's ability to fight infections. Try to avoid being around people who are sick. This medicine may increase your risk to bruise or bleed. Call your doctor or health care professional if you notice any unusual bleeding. Be careful brushing and flossing your teeth or using a toothpick because you may get an infection or bleed more easily. If you have any dental work done, tell your dentist you are receiving this medicine. Avoid taking products that contain aspirin, acetaminophen, ibuprofen, naproxen, or ketoprofen unless instructed by your doctor. These medicines may hide a fever. Do not become pregnant while taking this medicine. Women should inform their doctor if they wish to become pregnant or think they might be pregnant. There is a potential for serious side effects to an unborn child. Talk to your health care professional or pharmacist for more information. Do not breast-feed an infant while taking this medicine. Men should inform their doctor if they wish to father a child. This medicine may lower sperm counts. Do not treat diarrhea with over the counter products. Contact your doctor if you have diarrhea that lasts more than 2 days or if it is severe and watery. This medicine can make you more sensitive to the sun. Keep out of the sun. If you cannot avoid being in the sun, wear protective clothing and use sunscreen. Do not use sun lamps or tanning beds/booths. What side effects may I notice from receiving this medication? Side effects that you should report to your doctor or health care professional as soon as possible: allergic reactions like skin rash, itching or hives, swelling of the face, lips, or tongue low blood counts - this medicine may decrease the number of white blood cells, red blood cells and platelets. You may be at increased risk for infections and bleeding. signs of infection - fever or chills,  cough, sore throat, pain or difficulty passing urine signs of decreased platelets or bleeding - bruising, pinpoint red spots on the skin, black, tarry stools, blood in the urine signs of decreased red blood cells - unusually weak or tired, fainting spells, lightheadedness breathing problems changes in vision chest pain mouth sores nausea and vomiting pain, swelling, redness at site where injected pain, tingling, numbness in the hands or feet redness, swelling, or sores on hands or feet stomach pain unusual bleeding Side effects that usually do not require medical attention (report to your doctor or health care professional if they continue or are bothersome): changes in finger or toe nails diarrhea dry or itchy skin hair loss headache loss of appetite sensitivity of eyes to the light stomach  upset unusually teary eyes This list may not describe all possible side effects. Call your doctor for medical advice about side effects. You may report side effects to FDA at 1-800-FDA-1088. Where should I keep my medication? This drug is given in a hospital or clinic and will not be stored at home. NOTE: This sheet is a summary. It may not cover all possible information. If you have questions about this medicine, talk to your doctor, pharmacist, or health care provider.  2022 Elsevier/Gold Standard (2021-06-24 00:00:00)

## 2021-09-30 NOTE — Progress Notes (Signed)
Per MD ok to give Irinotecan with elevated Bili and liver enzymes. Will use alternative dosing of 75% of dose. She received 150mg /m2 in March, however normal dose is 180mg /m2. Will reduce normal dose by 25% to 135mg /m2.

## 2021-09-30 NOTE — Progress Notes (Signed)
Per Sonia Baller, NP and Dr. Grayland Ormond, proceed with reduced dose of Irinotecan with a ALT of 153,  AST of 174 and creatinine of 1.69. Patient tolerated treatment well without any complications.

## 2021-09-30 NOTE — Progress Notes (Signed)
Nutrition Follow-up:  Patient with metastatic pancreatic cancer with liver mets.  Patient to start folfiri with dose reduced irinotecan and eliminate 5-FU.  Met with patient during infusion.  Reports that she has not been eating as much due to feeling full.  Continues to drink ensure plus daily usually for bedtime snack.  Denies diarrhea or nausea.      Medications: reviewed  Labs: reviewed  Anthropometrics:   Weight 147 lb today 147 lb 14.4 oz on 11/15 155 lb on 6/28  153 lb on 5/31 148 lb on 4/19  NUTRITION DIAGNOSIS:  Inadequate oral intake stable   INTERVENTION:  Continue ensure plus daily, can increase to BID if weight decreases Continue to eat small frequent meals with added protein    MONITORING, EVALUATION, GOAL: weight trends, intake   NEXT VISIT: Tuesday, Jan 10 during infusion  Rhonda Day B. Zenia Resides, [place], [place] Registered Dietitian [phone] (mobile)

## 2021-10-02 ENCOUNTER — Other Ambulatory Visit: Payer: Self-pay

## 2021-10-02 ENCOUNTER — Inpatient Hospital Stay: Payer: Medicare PPO

## 2021-10-02 DIAGNOSIS — C787 Secondary malignant neoplasm of liver and intrahepatic bile duct: Secondary | ICD-10-CM

## 2021-10-02 DIAGNOSIS — Z5111 Encounter for antineoplastic chemotherapy: Secondary | ICD-10-CM | POA: Diagnosis not present

## 2021-10-02 MED ORDER — SODIUM CHLORIDE 0.9% FLUSH
10.0000 mL | INTRAVENOUS | Status: DC | PRN
  Administered 2021-10-02: 10 mL
  Filled 2021-10-02: qty 10

## 2021-10-02 MED ORDER — HEPARIN SOD (PORK) LOCK FLUSH 100 UNIT/ML IV SOLN
500.0000 [IU] | Freq: Once | INTRAVENOUS | Status: AC | PRN
  Administered 2021-10-02: 500 [IU]
  Filled 2021-10-02: qty 5

## 2021-10-06 ENCOUNTER — Other Ambulatory Visit: Payer: Self-pay | Admitting: *Deleted

## 2021-10-06 MED ORDER — TRAZODONE HCL 50 MG PO TABS: 25.0000 mg | ORAL_TABLET | Freq: Every evening | ORAL | 3 refills | Status: AC | PRN

## 2021-10-06 NOTE — Telephone Encounter (Signed)
Patient complaining of fatigue and think it is coming form her medicine in a pouch she takes ans is asking if anything can be done for fatigue. Please advise

## 2021-10-08 ENCOUNTER — Telehealth: Payer: Self-pay | Admitting: *Deleted

## 2021-10-08 NOTE — Telephone Encounter (Signed)
I spoke with patient.  She endorses significant fatigue since last chemotherapy treatment.  She is unclear if she wants further chemotherapy.  She is interested in clinic eval for fatigue and would like to come in on Friday for [facility] visit. Benjie Karvonen - would you please arrange for labs/[facility]/fluids on Friday.

## 2021-10-08 NOTE — Telephone Encounter (Signed)
Patient has called requesting to speak with Sharion Dove, NP stating that she is having problems with the medicine in the pouch causing her severe fatigue and is asking what can be done for her. Please advise

## 2021-10-09 ENCOUNTER — Inpatient Hospital Stay (HOSPITAL_BASED_OUTPATIENT_CLINIC_OR_DEPARTMENT_OTHER): Payer: Medicare PPO | Admitting: Hospice and Palliative Medicine

## 2021-10-09 ENCOUNTER — Other Ambulatory Visit: Payer: Self-pay

## 2021-10-09 ENCOUNTER — Inpatient Hospital Stay: Payer: Medicare PPO

## 2021-10-09 ENCOUNTER — Telehealth: Payer: Self-pay | Admitting: Oncology

## 2021-10-09 VITALS — BP 94/62 | HR 85 | Temp 97.4°F | Resp 17

## 2021-10-09 DIAGNOSIS — E860 Dehydration: Secondary | ICD-10-CM | POA: Diagnosis not present

## 2021-10-09 DIAGNOSIS — Z95828 Presence of other vascular implants and grafts: Secondary | ICD-10-CM

## 2021-10-09 DIAGNOSIS — C259 Malignant neoplasm of pancreas, unspecified: Secondary | ICD-10-CM

## 2021-10-09 DIAGNOSIS — R530 Neoplastic (malignant) related fatigue: Secondary | ICD-10-CM

## 2021-10-09 DIAGNOSIS — C787 Secondary malignant neoplasm of liver and intrahepatic bile duct: Secondary | ICD-10-CM

## 2021-10-09 DIAGNOSIS — Z5111 Encounter for antineoplastic chemotherapy: Secondary | ICD-10-CM | POA: Diagnosis not present

## 2021-10-09 LAB — CBC WITH DIFFERENTIAL/PLATELET
Abs Immature Granulocytes: 0.01 10*3/uL (ref 0.00–0.07)
Basophils Absolute: 0 10*3/uL (ref 0.0–0.1)
Basophils Relative: 0 %
Eosinophils Absolute: 0.1 10*3/uL (ref 0.0–0.5)
Eosinophils Relative: 1 %
HCT: 33 % — ABNORMAL LOW (ref 36.0–46.0)
Hemoglobin: 10.6 g/dL — ABNORMAL LOW (ref 12.0–15.0)
Immature Granulocytes: 0 %
Lymphocytes Relative: 21 %
Lymphs Abs: 0.9 10*3/uL (ref 0.7–4.0)
MCH: 30.8 pg (ref 26.0–34.0)
MCHC: 32.1 g/dL (ref 30.0–36.0)
MCV: 95.9 fL (ref 80.0–100.0)
Monocytes Absolute: 0.2 10*3/uL (ref 0.1–1.0)
Monocytes Relative: 5 %
Neutro Abs: 3.2 10*3/uL (ref 1.7–7.7)
Neutrophils Relative %: 73 %
Platelets: 189 10*3/uL (ref 150–400)
RBC: 3.44 MIL/uL — ABNORMAL LOW (ref 3.87–5.11)
RDW: 14.3 % (ref 11.5–15.5)
Smear Review: NORMAL
WBC: 4.3 10*3/uL (ref 4.0–10.5)
nRBC: 0 % (ref 0.0–0.2)

## 2021-10-09 LAB — COMPREHENSIVE METABOLIC PANEL
ALT: 101 U/L — ABNORMAL HIGH (ref 0–44)
AST: 56 U/L — ABNORMAL HIGH (ref 15–41)
Albumin: 3.6 g/dL (ref 3.5–5.0)
Alkaline Phosphatase: 491 U/L — ABNORMAL HIGH (ref 38–126)
Anion gap: 12 (ref 5–15)
BUN: 44 mg/dL — ABNORMAL HIGH (ref 8–23)
CO2: 25 mmol/L (ref 22–32)
Calcium: 9.5 mg/dL (ref 8.9–10.3)
Chloride: 93 mmol/L — ABNORMAL LOW (ref 98–111)
Creatinine, Ser: 2.03 mg/dL — ABNORMAL HIGH (ref 0.44–1.00)
GFR, Estimated: 25 mL/min — ABNORMAL LOW (ref >60)
Glucose, Bld: 549 mg/dL (ref 70–99)
Potassium: 3.5 mmol/L (ref 3.5–5.1)
Sodium: 130 mmol/L — ABNORMAL LOW (ref 135–145)
Total Bilirubin: 4.1 mg/dL — ABNORMAL HIGH (ref 0.3–1.2)
Total Protein: 7.4 g/dL (ref 6.5–8.1)

## 2021-10-09 MED ORDER — SODIUM CHLORIDE 0.9 % IV SOLN
INTRAVENOUS | Status: DC
  Filled 2021-10-09: qty 250

## 2021-10-09 MED ORDER — HEPARIN SOD (PORK) LOCK FLUSH 100 UNIT/ML IV SOLN
500.0000 [IU] | Freq: Once | INTRAVENOUS | Status: AC
  Administered 2021-10-09: 10:00:00 500 [IU] via INTRAVENOUS
  Filled 2021-10-09: qty 5

## 2021-10-09 MED ORDER — SODIUM CHLORIDE 0.9% FLUSH
10.0000 mL | Freq: Once | INTRAVENOUS | Status: AC
  Administered 2021-10-09: 10:00:00 10 mL via INTRAVENOUS
  Filled 2021-10-09: qty 10

## 2021-10-09 NOTE — Progress Notes (Signed)
Symptom Management [place] at [facility] [phone] [phone]  Patient Care Team: Dion Body, MD as PCP - General (Family Medicine) Clent Jacks, RN as Oncology Nurse Navigator   Name of the patient: Rhonda Day  [id number]  30-Jul-1941   Date of visit: 10/09/21  Reason for Consult:  Rhonda Day is a 80 y.o. 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  Patient last saw Dr. Janese Banks on 09/23/2021 at which time patient received a chemo holiday due to severe fatigue.  Fatigue improved with break from treatment.  CA 19-9 has been persistently climbing.  PET scan on 09/16/2021 was concerning for disease progression with enlarging hypermetabolic metastasis in the liver but without significant new tumor burden or distant metastatic disease.  Patient was felt to have poorly tolerated the gemcitabine Abraxane regimen.  Patient also tolerated FOLFIRINOX poorly.  Decision was made to try FOLFIRI chemotherapy.  Patient received cycle 1 FOLFIRI on 09/30/2021 with plan for her to receive cycle 2 on 10/14/2021.  However, patient requested [facility] visit today due to severe fatigue following last chemotherapy.  Patient denies other distressing symptoms at present.  No fever or chills.  No GI or GU symptoms.  No respiratory issues.  She feels her appetite is fair.  However, she endorses severe fatigue and is mostly in chair or bed during the day.  Sleep is adequate at night.  Denies any neurologic complaints. Denies recent fevers or illnesses. Denies any easy bleeding or bruising. Reports fair appetite and denies weight loss. Denies chest pain. Denies any nausea, vomiting, constipation, or diarrhea. Denies urinary complaints. Patient offers no further specific complaints today.   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   B12 deficiency 09/30/2017   Bladder infection    Cancer associated pain 01/10/2021   Chronic allergic conjunctivitis 12/02/2020   Chronic cystitis 01/26/2017   Chronic pain syndrome 12/16/2017   Controlled type 2 diabetes mellitus with chronic kidney disease, without long-term current use of insulin ([place]) 01/09/2021   Controlled type 2 diabetes mellitus without complication ([place]) 05/23/1323   Convalescence following chemotherapy 01/10/2021   Degeneration of lumbar intervertebral disc 12/16/2017   Diabetes mellitus without complication ([place])    Dysuria 12/23/2016   Essential hypertension 11/15/2015   Family history of kidney cancer    Family history of kidney cancer    Family history of lung cancer    Family history of lung cancer    Family history of stomach cancer    Family history of stomach cancer    Family history of throat cancer    Family history of throat cancer    Female stress incontinence 11/11/2012   Genetic testing 01/28/2021   Negative genetic testing. No pathogenic variants identified on the [facility] Multi-Cancer+RNA panel. VUS in REC[facility] called c.1978G>A and VUS in [facility] called c.983C>T were identified. The report date its 01/25/2021.  The Multi-Cancer Panel + RNA offered by [facility] includes sequencing and/or deletion duplication testing of the following 84 genes: AIP, ALK, APC, ATM, AXIN2,BAP1,  BARD1, BLM, BMPR1A, BRC   Goals of care, counseling/discussion 12/13/2020   History of atrial fibrillation 04/09/2016   History of chronic health problem 05/11/2016   Hyperlipidemia, mixed 12/09/2020  Hypertension    Incomplete bladder emptying 11/16/2012   Low back pain 11/11/2012   Low bladder compliance 11/11/2012   Lumbar post-laminectomy syndrome 12/16/2017   Medicare annual wellness visit, subsequent 09/29/2016    Menopause syndrome 12/24/2016   Other specified symptom associated with female genital organs 11/11/2012   Pancreatic cancer [facility])    with liver mass   Pancreatic cancer metastasized to liver ([place]) 12/13/2020   Pancreatic cancer metastasized to liver ([place]) 12/13/2020   Paroxysmal A-fib ([place]) 12/09/2020   Stage 3b chronic kidney disease ([place]) 03/23/2020   Symptoms involving urinary system 11/11/2012   Type 2 diabetes mellitus ([place]) 11/15/2015   Type 2 diabetes mellitus with diabetic chronic kidney disease ([place]) 03/23/2020   Vaginal spotting 05/11/2017   Vasomotor rhinitis 12/02/2020    PAST SURGICAL HISTORY:  Past Surgical History:  Procedure Laterality Date   BACK SURGERY     PORTA CATH INSERTION N/A 12/27/2020   Procedure: PORTA CATH INSERTION;  Surgeon: Algernon Huxley, MD;  Location: [place] [facility];  Service: Cardiovascular;  Laterality: N/A;   TUBAL LIGATION      HEMATOLOGY/ONCOLOGY HISTORY:  Oncology History  Pancreatic cancer metastasized to liver ([place])  12/13/2020 Initial Diagnosis   Pancreatic cancer metastasized to liver ([place])   12/13/2020 Cancer Staging   Staging form: Exocrine Pancreas, AJCC 8th Edition - Clinical stage from 12/13/2020: Stage IV (cT2, cN0, cM1) - Signed by Sindy Guadeloupe, MD on 12/13/2020 Total positive nodes: 0    12/30/2020 - 01/01/2021 Chemotherapy          02/04/2021 - 09/09/2021 Chemotherapy   Patient is on Treatment Plan : PANCREAS Gemcitabine D1,8,q21d x 6 Cycles      Genetic Testing   Negative genetic testing. No pathogenic variants identified on the [facility] Multi-Cancer+RNA panel. VUS in RECQL4 called c.1978G>A and VUS in [facility] called c.983C>T were identified. The report date its 01/25/2021.  The Multi-Cancer Panel + RNA offered by [facility] includes sequencing and/or deletion duplication testing of the following 84 genes: AIP, ALK, APC, ATM, AXIN2,BAP1,  BARD1, BLM, BMPR1A, BRCA1, BRCA2, BRIP1, CASR, CDC73, CDH1, CDK4, CDKN1B, CDKN1C, CDKN2A (p14ARF),  CDKN2A (p16INK4a), CEBPA, CHEK2, CTNNA1, DICER1, DIS3L2, EGFR (c.2369C>T, p.Thr790Met variant only), EPCAM (Deletion/duplication testing only), FH, FLCN, GATA2, GPC3, GREM1 (Promoter region deletion/duplication testing only), HOXB13 (c.251G>A, p.Gly84Glu), HRAS, KIT, MAX, MEN1, MET, MITF (c.952G>A, p.Glu318Lys variant only), MLH1, MSH2, MSH3, MSH6, MUTYH, NBN, NF1, NF2, NTHL1, PALB2, PDGFRA, PHOX2B, PMS2, POLD1, POLE, POT1, PRKAR1A, PTCH1, PTEN, RAD50, RAD51C, RAD51D, RB1, RECQL4, RET, RUNX1, SDHAF2, SDHA (sequence changes only), SDHB, SDHC, SDHD, SMAD4, SMARCA4, SMARCB1, SMARCE1, STK11, SUFU, TERC, TERT, TMEM127, TP53, TSC1, TSC2, VHL, WRN and WT1.   09/30/2021 -  Chemotherapy   Patient is on Treatment Plan : PANCREAS FOLFIRI q14d       ALLERGIES:  is allergic to sulfa antibiotics.  MEDICATIONS:  Current Outpatient Medications  Medication Sig Dispense Refill   azelastine (ASTELIN) 0.1 % nasal spray Place into both nostrils daily as needed.     azelastine (OPTIVAR) 0.05 % ophthalmic solution Place into both eyes daily as needed.  3   diltiazem (TIAZAC) 180 MG 24 hr capsule Take by mouth daily.     gabapentin (NEURONTIN) 100 MG capsule Take 1 capsule (100 mg total) by mouth at bedtime. (Patient not taking: Reported on 09/23/2021) 30 capsule 2   Insulin Glargine Solostar (LANTUS) 100 UNIT/ML Solostar Pen Inject 10 Units into the skin at bedtime.     LORazepam (ATIVAN) 0.5 MG tablet  Take 1 tablet (0.5 mg total) by mouth every 8 (eight) hours as needed for anxiety. 60 tablet 0   losartan (COZAAR) 100 MG tablet Take 100 mg by mouth daily.     losartan (COZAAR) 25 MG tablet Take 1 tablet by mouth daily. (Patient not taking: Reported on 07/11/2021)     morphine (MS CONTIN) 15 MG 12 hr tablet Take 1 tablet (15 mg total) by mouth every 12 (twelve) hours. (Patient not taking: Reported on 09/23/2021) 60 tablet 0   ondansetron (ZOFRAN) 4 MG tablet Take 1 tablet (4 mg total) by mouth every 8 (eight) hours as  needed for nausea or vomiting. 20 tablet 0   ONE TOUCH ULTRA TEST test strip   1   ONETOUCH DELICA LANCETS 81E MISC 1 each by XX route as directed. Check CBG's fasting once daily. Dx: E11.9     Oxycodone HCl 10 MG TABS Take 1 tablet (10 mg total) by mouth every 6 (six) hours as needed. 120 tablet 0   pantoprazole (PROTONIX) 20 MG tablet Take 1 tablet (20 mg total) by mouth daily. 30 tablet 2   prochlorperazine (COMPAZINE) 10 MG tablet Take 1 tablet (10 mg total) by mouth every 6 (six) hours as needed for nausea or vomiting. 30 tablet 0   traZODone (DESYREL) 50 MG tablet Take 0.5-1 tablets (25-50 mg total) by mouth at bedtime as needed for sleep. 30 tablet 3   No current facility-administered medications for this visit.   Facility-Administered Medications Ordered in Other Visits  Medication Dose Route Frequency Provider Last Rate Last Admin   heparin lock flush 100 UNIT/ML injection            sodium chloride flush (NS) 0.9 % injection 10 mL  10 mL Intravenous PRN Sindy Guadeloupe, MD   10 mL at 03/04/21 1045    VITAL SIGNS: BP 94/62    Pulse 85    Temp (!) 97.4 F (36.3 C) (Tympanic)    Resp 17    LMP  (LMP Unknown)    SpO2 98%  There were no vitals filed for this visit.  Estimated body mass index is 21.71 kg/m as calculated from the following:   Height as of 09/02/21: _0  (1.753 m).   Weight as of 09/30/21: 147 lb (66.7 kg).  LABS: CBC:    Component Value Date/Time   WBC 7.4 09/30/2021 1051   HGB 10.8 (L) 09/30/2021 1051   HCT 33.5 (L) 09/30/2021 1051   PLT 350 09/30/2021 1051   MCV 96.8 09/30/2021 1051   NEUTROABS 5.4 09/30/2021 1051   LYMPHSABS 1.0 09/30/2021 1051   MONOABS 0.9 09/30/2021 1051   EOSABS 0.1 09/30/2021 1051   BASOSABS 0.0 09/30/2021 1051   Comprehensive Metabolic Panel:    Component Value Date/Time   NA 133 (L) 09/30/2021 1051   K 3.9 09/30/2021 1051   CL 97 (L) 09/30/2021 1051   CO2 24 09/30/2021 1051   BUN 28 (H) 09/30/2021 1051   CREATININE 1.69  (H) 09/30/2021 1051   GLUCOSE 296 (H) 09/30/2021 1051   CALCIUM 9.5 09/30/2021 1051   AST 174 (H) 09/30/2021 1051   ALT 153 (H) 09/30/2021 1051   ALKPHOS 496 (H) 09/30/2021 1051   BILITOT 2.5 (H) 09/30/2021 1051   PROT 7.3 09/30/2021 1051   ALBUMIN 3.6 09/30/2021 1051    RADIOGRAPHIC STUDIES: NM PET Image Restag (PS) Skull Base To Thigh  Result Date: 09/17/2021 CLINICAL DATA:  Subsequent treatment strategy for pancreatic cancer. EXAM:  NUCLEAR MEDICINE PET SKULL BASE TO THIGH TECHNIQUE: 8.0 mCi F-18 FDG was injected intravenously. Full-ring PET imaging was performed from the skull base to thigh after the radiotracer. CT data was obtained and used for attenuation correction and anatomic localization. Fasting blood glucose: 179 mg/dl COMPARISON:  CT chest abdomen pelvis 07/08/2021 and PET 04/01/2021. FINDINGS: Mediastinal blood pool activity: SUV max 2.7 Liver activity: SUV max NA NECK: No abnormal hypermetabolism. Incidental CT findings: None. CHEST: No hypermetabolic mediastinal, hilar or axillary lymph nodes. No hypermetabolic pulmonary nodules. Incidental CT findings: Right IJ Port-A-Cath terminates in the low SVC. Heterogeneous 2.6 cm left thyroid nodule. Atherosclerotic calcification of the aorta. Enlarged pulmonic trunk and heart. No pericardial or pleural effusion. Trace right pleural effusion. ABDOMEN/PELVIS: Mild hypermetabolism in the dome of the liver, associated with a 2.0 x 2.3 cm low-attenuation lesion (3/114), SUV max 4.6. Lesion has increased in size from 1.1 x 1.5 cm on 07/08/2021. Similar focal mild hyperattenuation in the central liver, SUV max 4.3, without a definite CT correlate. Pancreatic body mass measures approximately 3.8 x 4.0 cm (3/153), SUV max 4.9, generally stable in size from 07/08/2021. No abnormal hypermetabolism in the adrenal glands or spleen. No hypermetabolic lymph nodes. Subcutaneous soft tissue nodule at the umbilicus measures 1.4 x 1.7 cm (3/193), stable in size  from 07/08/2021, SUV max 3.6. Lesion is new from prior PET. Incidental CT findings: Similar biliary ductal dilatation. Trace perihepatic ascites. Gallbladder, adrenal glands unremarkable. Subcentimeter low-attenuation lesion in the right kidney, too small to characterize. Left renal stones. Spleen, pancreas, stomach and bowel are otherwise grossly unremarkable. SKELETON: No abnormal hypermetabolism. Incidental CT findings: Degenerative changes in the spine. IMPRESSION: 1. Hypermetabolic pancreatic mass, similar to prior. Enlarging hypermetabolic metastasis in the dome of the liver. Additional vague hypermetabolism in the central liver without a definite CT correlate. Recommend attention on follow-up. 2. Hypermetabolic subcutaneous umbilical nodule, stable in size from 07/08/2021 but new from 04/01/2021, worrisome for a metastasis. 3. Trace right pleural effusion.  Trace perihepatic ascites. 4. Heterogeneous 2.6 cm left thyroid nodule. Recommend thyroid ultrasound. (Ref: J Am Coll Radiol. 2015 Feb;12(2): 143-50). Aortic atherosclerosis (ICD10-I70.0). 5. Left renal stones. 6. Enlarged pulmonic trunk, indicative of pulmonary arterial hypertension. Electronically Signed   By: Lorin Picket M.D.   On: 09/17/2021 15:38    PERFORMANCE STATUS (ECOG) : 3 - Symptomatic, >50% confined to bed  Review of Systems Unless otherwise noted, a complete review of systems is negative.  Physical Exam General: NAD Cardiovascular: regular rate and rhythm Pulmonary: clear anterior/posterior fields Abdomen: soft, nontender, + bowel sounds GU: no suprapubic tenderness Extremities: no edema, no joint deformities Skin: no rashes Neurological: Weakness but otherwise nonfocal  Assessment and Plan- Patient is a 80 y.o. 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  Patient is demonstrating poor tolerance to several chemotherapy regimens.  Today, she says that she is not interested in additional treatment with FOLFIRI.  She asked about rotating back to gemcitabine and Abraxane but I reminded her that she had to have a chemo holiday due to poor tolerance/fatigue.  Patient would be interested in speaking with Dr. Janese Banks to ensure that treatment options have been exhausted.  We did discuss the option of best supportive/hospice care today.  Patient seems to be leaning towards hospice involvement if Dr. Janese Banks feels it is clinically appropriate.  Patient does not want to make any decisions today.  We will schedule follow-up with Dr. Janese Banks and me to further discuss goals.  For fatigue, discussed energy conservation techniques.  Patient would be interested in medication to help alleviate fatigue.  Steroids are not an option given her chronic hyperglycemia.  We will try American ginseng supplementation.  Hyperglycemia/ A/CKD-IV fluids today.  Patient says she ran out of her Lantus last week and went some days without it.  She restarted Lantus earlier this week but is unsure if she gave herself a dose last night.  We discussed the importance of strict adherence to insulin regimen.  I recommended that she keep a log of both CBG and insulin administration.  We will also reach out to PCP office today for assistance with management of hyperglycemia and consideration of dose adjustment of her insulin.  Follow-up for labs/MD/Palliative care visit on 1/6.  We are available to see her sooner in [facility] if needed.  Patient expressed understanding and was in agreement with this plan. She also understands that She can call clinic at any time with any questions, concerns, or complaints.   Thank you for allowing me to participate in the care of this very pleasant patient.   Time Total: 25 minutes  Visit consisted of counseling and education dealing with the complex and  emotionally intense issues of symptom management in the setting of serious illness.Greater than 50%  of this time was spent counseling and coordinating care related to the above assessment and plan.  Signed by: Altha Harm, PhD, NP-C

## 2021-10-09 NOTE — Telephone Encounter (Signed)
Left Vm with patient with details about future appointments. She has decided to hold off on chemo until discussing with Dr. Janese Banks on 10/24/21 and she will see Josh Borders on that day also.

## 2021-10-09 NOTE — Progress Notes (Signed)
Pt presents to [facility] with reports of severe fatigue after her treatments. States that fatigue is so bad, it interferes with her quality of life, and she is not sure that she wants to continue treatments. Wants to discuss her options.

## 2021-10-10 ENCOUNTER — Inpatient Hospital Stay: Payer: Medicare PPO | Admitting: Oncology

## 2021-10-10 ENCOUNTER — Inpatient Hospital Stay: Payer: Medicare PPO

## 2021-10-14 ENCOUNTER — Inpatient Hospital Stay: Payer: Medicare PPO | Admitting: Oncology

## 2021-10-14 ENCOUNTER — Inpatient Hospital Stay: Payer: Medicare PPO

## 2021-10-17 ENCOUNTER — Encounter: Payer: Self-pay | Admitting: Oncology

## 2021-10-17 ENCOUNTER — Inpatient Hospital Stay (HOSPITAL_BASED_OUTPATIENT_CLINIC_OR_DEPARTMENT_OTHER): Payer: Medicare PPO | Admitting: Oncology

## 2021-10-17 ENCOUNTER — Other Ambulatory Visit: Payer: Self-pay

## 2021-10-17 VITALS — BP 114/74 | HR 91 | Temp 98.3°F | Resp 12 | Wt 137.0 lb

## 2021-10-17 DIAGNOSIS — Z7189 Other specified counseling: Secondary | ICD-10-CM | POA: Diagnosis not present

## 2021-10-17 DIAGNOSIS — C259 Malignant neoplasm of pancreas, unspecified: Secondary | ICD-10-CM | POA: Diagnosis not present

## 2021-10-17 DIAGNOSIS — C787 Secondary malignant neoplasm of liver and intrahepatic bile duct: Secondary | ICD-10-CM

## 2021-10-17 DIAGNOSIS — Z5111 Encounter for antineoplastic chemotherapy: Secondary | ICD-10-CM | POA: Diagnosis not present

## 2021-10-17 NOTE — Progress Notes (Signed)
Patient here for follow up she reports multiple physical problems tod, weakness unable to perform ADLs, confusion for the past week, generalized itching which is constant.

## 2021-10-20 ENCOUNTER — Encounter: Payer: Self-pay | Admitting: Oncology

## 2021-10-20 NOTE — Progress Notes (Signed)
Hematology/Oncology Consult note [facility]  [phone] [phone]  Patient Care Team: Dion Body, MD as PCP - General (Family Medicine) Clent Jacks, RN as Oncology Nurse Navigator   Name of the patient: Rhonda Day  [id number]  12/01/1940   Date of visit: 10/20/21  Diagnosis- metastatic pancreatic cancer with liver metastases  Chief complaint/ Reason for visit- discuss goals of care  Heme/Onc history:  Patient is a 81 year old female who underwent ultrasound abdomen in December 2021 which showed hypoechoic mass in the pancreatic tail measuring 2.1 x 1.8 x 4.5 cm which was not seen on prior CT scans on MRI exams a year ago.  This was followed by a CT abdomen and pelvis with contrast which showed ill-defined hypervascular lesion in segment 8 of liver measuring 1.6 x 1.4 cm concerning for metastases.  2 other lesions 1.7 x 1 cm and 1.5 x 0.8 cm were also noted.  Large mass in the body of pancreas measuring 5.6 x 2.2 x 2.9 cm.  The lesion causes obstruction of the pancreatic duct resulting in ductal dilatation throughout the tail of the pancreas.  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  Splenic vein appears chronically occluded.  Lesion also comes in contact with splenic artery which remains patent.  This was followed by MRI abdomen which again showed similar findings.  Multiple foci of restricted diffusion with hyperenhancement noted in the right hepatic lobe.  Prominent portocaval lymph node seen for local nodal metastases along with a 5.3 cm body and tail pancreatic mass   Case was discussed at tumor board and lab was liver biopsy 2 weeks after patient initial Covid test that was positive.  However at the day of the biopsy this lesion was in close association with other vascular structures and given the potential higher risks  liver biopsy was aborted and EUS was recommended.  She is currently on Cardizem.     Patient had a EUS at [place] By Dr. Mont Dutton. Pathology was consistent with adenocarcinoma. Patient had a PET scan on 12/11/2020 which showed a hypermetabolic lesion in the medial aspect of the liver with an SUV of 4.2 there is a subtle hypodensity on the comparison CT measuring 9 mm at this level. The hypermetabolic lesion in the dome of the liver corresponds to the enhancing lesion on the comparison MRI. 3.4 x 2.7 cm lesion in the mid body of pancreas with an SUV of 5.1. No hypermetabolic peripancreatic lymph nodes.    Patient received 1 cycle of modified FOLFIRINOX chemotherapy on 12/30/2020.  Following that patient had significant nausea vomiting fatigue and declining performance status requiring chemotherapy to be put on hold.  Patient is currently receiving gemcitabine Abraxane chemotherapy 2 weeks on and 1 week off    Interval history-patient has been feeling increasingly fatigued after her last round of chemotherapy which she wasGiven on 09/30/2021.  She has not really been able to do much around the house.  She spends most of the time in bed or resting.  ECOG PS- 3 Pain scale- 0   Review of systems- Review of Systems  Constitutional:  Positive for malaise/fatigue. Negative for chills, fever and weight loss.  HENT:  Negative for congestion, ear discharge and nosebleeds.   Eyes:  Negative for blurred vision.  Respiratory:  Negative for cough, hemoptysis, sputum production, shortness of breath and wheezing.   Cardiovascular:  Negative for chest pain, palpitations, orthopnea and claudication.  Gastrointestinal:  Negative for abdominal pain, blood in stool, constipation, diarrhea, heartburn, melena, nausea and vomiting.  Genitourinary:  Negative for dysuria, flank pain, frequency, hematuria and urgency.  Musculoskeletal:  Negative for back pain, joint pain and myalgias.  Skin:  Negative for rash.   Neurological:  Negative for dizziness, tingling, focal weakness, seizures, weakness and headaches.  Endo/Heme/Allergies:  Does not bruise/bleed easily.  Psychiatric/Behavioral:  Negative for depression and suicidal ideas. The patient does not have insomnia.      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   B12 deficiency 09/30/2017   Bladder infection    Cancer associated pain 01/10/2021   Chronic allergic conjunctivitis 12/02/2020   Chronic cystitis 01/26/2017   Chronic pain syndrome 12/16/2017   Controlled type 2 diabetes mellitus with chronic kidney disease, without long-term current use of insulin ([place]) 01/09/2021   Controlled type 2 diabetes mellitus without complication ([place]) 12/25/1015   Convalescence following chemotherapy 01/10/2021   Degeneration of lumbar intervertebral disc 12/16/2017   Diabetes mellitus without complication ([place])    Dysuria 12/23/2016   Essential hypertension 11/15/2015   Family history of kidney cancer    Family history of kidney cancer    Family history of lung cancer    Family history of lung cancer    Family history of stomach cancer    Family history of stomach cancer    Family history of throat cancer    Family history of throat cancer    Female stress incontinence 11/11/2012   Genetic testing 01/28/2021   Negative genetic testing. No pathogenic variants identified on the [facility] Multi-Cancer+RNA panel. VUS in [facility] called c.1978G>A and VUS in [facility] called c.983C>T were identified. The report date its 01/25/2021.  The Multi-Cancer Panel + RNA offered by [facility] includes sequencing and/or deletion duplication testing of the following 84 genes: AIP, ALK, APC, ATM,  AXIN2,BAP1,  BARD1, BLM, BMPR1A, BRC   Goals of care, counseling/discussion 12/13/2020   History of atrial fibrillation 04/09/2016   History of chronic health problem 05/11/2016   Hyperlipidemia, mixed 12/09/2020   Hypertension    Incomplete bladder emptying 11/16/2012   Low back pain 11/11/2012   Low bladder compliance 11/11/2012   Lumbar post-laminectomy syndrome 12/16/2017   Medicare annual wellness visit, subsequent 09/29/2016   Menopause syndrome 12/24/2016   Other specified symptom associated with female genital organs 11/11/2012   Pancreatic cancer [facility])    with liver mass   Pancreatic cancer metastasized to liver ([place]) 12/13/2020   Pancreatic cancer metastasized to liver ([place]) 12/13/2020   Paroxysmal A-fib ([place]) 12/09/2020   Stage 3b chronic kidney disease ([place]) 03/23/2020   Symptoms involving urinary system 11/11/2012   Type 2 diabetes mellitus ([place]) 11/15/2015   Type 2 diabetes mellitus with diabetic chronic kidney disease ([place]) 03/23/2020   Vaginal spotting 05/11/2017   Vasomotor rhinitis 12/02/2020     Past Surgical History:  Procedure Laterality Date   BACK SURGERY     PORTA CATH INSERTION N/A 12/27/2020   Procedure: PORTA CATH INSERTION;  Surgeon: Algernon Huxley, MD;  Location: [facility];  Service: Cardiovascular;  Laterality: N/A;   TUBAL LIGATION  Social History   Socioeconomic History   Marital status: Married    Spouse name: Not on file   Number of children: Not on file   Years of education: Not on file   Highest education level: Not on file  Occupational History   Not on file  Tobacco Use   Smoking status: Former    Types: Cigarettes    Quit date: 11/20/1979    Years since quitting: 41.9   Smokeless tobacco: Never  Vaping Use   Vaping Use: Never used  Substance and Sexual Activity   Alcohol use: No    Alcohol/week: 0.0 standard drinks   Drug use: No   Sexual activity: Never  Other Topics Concern   Not on file  Social History Narrative   Not on file    Social Determinants of Health   Financial Resource Strain: Not on file  Food Insecurity: Not on file  Transportation Needs: Not on file  Physical Activity: Not on file  Stress: Not on file  Social Connections: Not on file  Intimate Partner Violence: Not on file    Family History  Problem Relation Age of Onset   Kidney cancer Father 24   COPD Mother    Stomach cancer Maternal Aunt    Throat cancer Maternal Uncle    Liver cancer Paternal Uncle    Esophageal cancer Paternal Grandfather    Lung cancer Maternal Aunt    Lung cancer Maternal Uncle    Lung cancer Cousin    Kidney cancer Cousin 21   Testicular cancer Nephew        dx 20s   Breast cancer Neg Hx      Current Outpatient Medications:    azelastine (ASTELIN) 0.1 % nasal spray, Place into both nostrils daily as needed., Disp: , Rfl:    azelastine (OPTIVAR) 0.05 % ophthalmic solution, Place into both eyes daily as needed., Disp: , Rfl: 3   diltiazem (TIAZAC) 180 MG 24 hr capsule, Take by mouth daily., Disp: , Rfl:    gabapentin (NEURONTIN) 100 MG capsule, Take 1 capsule (100 mg total) by mouth at bedtime., Disp: 30 capsule, Rfl: 2   Insulin Glargine Solostar (LANTUS) 100 UNIT/ML Solostar Pen, Inject 10 Units into the skin at bedtime., Disp: , Rfl:    LORazepam (ATIVAN) 0.5 MG tablet, Take 1 tablet (0.5 mg total) by mouth every 8 (eight) hours as needed for anxiety., Disp: 60 tablet, Rfl: 0   losartan (COZAAR) 100 MG tablet, Take 100 mg by mouth daily., Disp: , Rfl:    losartan (COZAAR) 25 MG tablet, Take 1 tablet by mouth daily., Disp: , Rfl:    morphine (MS CONTIN) 15 MG 12 hr tablet, Take 1 tablet (15 mg total) by mouth every 12 (twelve) hours., Disp: 60 tablet, Rfl: 0   ondansetron (ZOFRAN) 4 MG tablet, Take 1 tablet (4 mg total) by mouth every 8 (eight) hours as needed for nausea or vomiting., Disp: 20 tablet, Rfl: 0   ONE TOUCH ULTRA TEST test strip, , Disp: , Rfl: 1   ONETOUCH DELICA LANCETS 73X MISC, 1 each by XX  route as directed. Check CBG's fasting once daily. Dx: E11.9, Disp: , Rfl:    Oxycodone HCl 10 MG TABS, Take 1 tablet (10 mg total) by mouth every 6 (six) hours as needed., Disp: 120 tablet, Rfl: 0   pantoprazole (PROTONIX) 20 MG tablet, Take 1 tablet (20 mg total) by mouth daily., Disp: 30 tablet, Rfl: 2   prochlorperazine (COMPAZINE)  10 MG tablet, Take 1 tablet (10 mg total) by mouth every 6 (six) hours as needed for nausea or vomiting., Disp: 30 tablet, Rfl: 0   traZODone (DESYREL) 50 MG tablet, Take 0.5-1 tablets (25-50 mg total) by mouth at bedtime as needed for sleep., Disp: 30 tablet, Rfl: 3 No current facility-administered medications for this visit.  Facility-Administered Medications Ordered in Other Visits:    heparin lock flush 100 UNIT/ML injection, , , ,    sodium chloride flush (NS) 0.9 % injection 10 mL, 10 mL, Intravenous, PRN, Sindy Guadeloupe, MD, 10 mL at 03/04/21 1045  Physical exam:  Vitals:   10/17/21 1440  BP: 114/74  Pulse: 91  Resp: 12  Temp: 98.3 F (36.8 C)  TempSrc: Tympanic  Weight: 137 lb (62.1 kg)   Physical Exam Constitutional:      General: She is not in acute distress.    Comments: Sitting in a wheelchair. Appears fatigued  Eyes:     Comments: Scleral icterus  Cardiovascular:     Rate and Rhythm: Normal rate and regular rhythm.     Heart sounds: Normal heart sounds.  Pulmonary:     Effort: Pulmonary effort is normal.     Breath sounds: Normal breath sounds.  Abdominal:     General: Bowel sounds are normal.     Palpations: Abdomen is soft.  Skin:    General: Skin is warm and dry.  Neurological:     Mental Status: She is alert and oriented to person, place, and time.     CMP Latest Ref Rng & Units 10/09/2021  Glucose 70 - 99 mg/dL 549(HH)  BUN 8 - 23 mg/dL 44(H)  Creatinine 0.44 - 1.00 mg/dL 2.03(H)  Sodium 135 - 145 mmol/L 130(L)  Potassium 3.5 - 5.1 mmol/L 3.5  Chloride 98 - 111 mmol/L 93(L)  CO2 22 - 32 mmol/L 25  Calcium 8.9 - 10.3  mg/dL 9.5  Total Protein 6.5 - 8.1 g/dL 7.4  Total Bilirubin 0.3 - 1.2 mg/dL 4.1(H)  Alkaline Phos 38 - 126 U/L 491(H)  AST 15 - 41 U/L 56(H)  ALT 0 - 44 U/L 101(H)   CBC Latest Ref Rng & Units 10/09/2021  WBC 4.0 - 10.5 K/uL 4.3  Hemoglobin 12.0 - 15.0 g/dL 10.6(L)  Hematocrit 36.0 - 46.0 % 33.0(L)  Platelets 150 - 400 K/uL 189      Assessment and plan- Patient is a 81 y.o. female with metastatic pancreatic cancer and liver metastases here for further goals of care discussion  Patient received FOLFIRI chemotherapy on 09/30/2021.  She has not tolerated treatment well and has experienced significant fatigue following chemotherapy.  Labs presently show worsening renal functions which is up from 1.5-2.0.  Total bilirubin up from 1.2-4.1 with elevated ALT AST and alkaline phosphatase.  Although recent PET scan did not show significant disease progression I am concerned that there is more disease than what needs the eye on the PET scan.  Clearly she is not a candidate for further systemic chemotherapy at this time and pursuing best supportive care/hospice is reasonable.  Patient and her husband are accepting home hospice through with her care at this time.  We will make arrangements for the same.  No further follow-up required with me at this time   Visit Diagnosis 1. Pancreatic cancer metastasized to liver ([place])   2. Goals of care, counseling/discussion      Dr. Randa Evens, MD, MPH [facility] at [facility] [phone] 10/20/2021 7:08  PM

## 2021-10-21 ENCOUNTER — Telehealth: Payer: Self-pay | Admitting: *Deleted

## 2021-10-21 ENCOUNTER — Inpatient Hospital Stay: Payer: Medicare PPO | Admitting: Hospice and Palliative Medicine

## 2021-10-21 NOTE — Telephone Encounter (Signed)
Yes. We need to stop checking blood sugars in that case and focus on her comfort

## 2021-10-21 NOTE — Telephone Encounter (Signed)
If she is in discomfort from insulin injections we can stop it. It all depends on what her gaols are

## 2021-10-21 NOTE — Telephone Encounter (Signed)
Amy with hospice called stating that patient and her husband are asking if patient can stop her insulin injections. Please advise

## 2021-10-22 ENCOUNTER — Encounter: Payer: Medicare PPO | Admitting: Hospice and Palliative Medicine

## 2021-10-22 ENCOUNTER — Other Ambulatory Visit: Payer: Medicare PPO

## 2021-10-22 ENCOUNTER — Ambulatory Visit: Payer: Medicare PPO

## 2021-10-24 ENCOUNTER — Encounter: Payer: Medicare PPO | Admitting: Hospice and Palliative Medicine

## 2021-10-24 ENCOUNTER — Other Ambulatory Visit: Payer: Medicare PPO

## 2021-10-24 ENCOUNTER — Ambulatory Visit: Payer: Medicare PPO | Admitting: Oncology

## 2021-10-24 ENCOUNTER — Ambulatory Visit: Payer: Medicare PPO

## 2021-10-26 ENCOUNTER — Other Ambulatory Visit: Payer: Self-pay | Admitting: Oncology

## 2021-10-26 MED ORDER — INSULIN GLARGINE SOLOSTAR 100 UNIT/ML ~~LOC~~ SOPN: 10.0000 [IU] | PEN_INJECTOR | Freq: Every day | SUBCUTANEOUS | 0 refills | Status: AC

## 2021-10-26 MED ORDER — INSULIN ASPART FLEXPEN 100 UNIT/ML ~~LOC~~ SOPN: 10.0000 [IU] | PEN_INJECTOR | SUBCUTANEOUS | 0 refills | Status: AC

## 2021-10-27 ENCOUNTER — Other Ambulatory Visit: Payer: Self-pay | Admitting: *Deleted

## 2021-10-27 ENCOUNTER — Telehealth: Payer: Self-pay | Admitting: Hospice and Palliative Medicine

## 2021-10-27 MED ORDER — OXYCODONE HCL 10 MG PO TABS: 10.0000 mg | ORAL_TABLET | Freq: Four times a day (QID) | ORAL | 0 refills | Status: AC | PRN

## 2021-10-27 NOTE — Telephone Encounter (Signed)
Attempted to call patient and her husband to discuss question regarding insulin.  I was unable to reach them by phone.  Also unable to leave voicemail.  I spoke with hospice nurse, Amy.  She and nursing supervisor have both talked to patient's husband today.  Hospice did approve refill of Lantus and husband was at pharmacy picking up prescription.  They have talked about plan of rotating to insulin on hospice formulary at time of next refill.  Dr. Gilford Rile -hospice physician has agreed to manage insulin.

## 2021-10-28 ENCOUNTER — Ambulatory Visit: Payer: Medicare PPO | Admitting: Oncology

## 2021-10-28 ENCOUNTER — Other Ambulatory Visit: Payer: Medicare PPO

## 2021-10-28 ENCOUNTER — Ambulatory Visit: Payer: Medicare PPO

## 2021-10-31 ENCOUNTER — Encounter: Payer: Self-pay | Admitting: Oncology

## 2021-10-31 NOTE — Telephone Encounter (Signed)
Signing encounter, See previous note on 01/10/21

## 2021-11-11 ENCOUNTER — Telehealth: Payer: Medicare PPO | Admitting: Hospice and Palliative Medicine

## 2021-11-12 ENCOUNTER — Telehealth: Payer: Self-pay | Admitting: Hospice and Palliative Medicine

## 2021-11-12 NOTE — Telephone Encounter (Signed)
Received a call from hospice nurse, Amy.  She reports that patient's CBG have down trended some reflecting reduced oral intake.  Patient overall is declining and is mostly in bed sleeping.  She is eating about 1 meal a day.  CBG is trended to a high of 230s this week but she is also had a couple of lows in the 70s.  Recommended holding NovoLog and can dose reduce Lantus from 15 units to 10 units nightly.  Okay to discontinue Lantus entirely if she continues to run low.

## 2021-12-17 DEATH — deceased
